# Patient Record
Sex: Male | Born: 1970 | Race: Black or African American | Hispanic: No | Marital: Single | Smoking: Former smoker
Health system: Southern US, Community
[De-identification: ages and names within clinical notes are randomized; demographics above are authoritative.]

## PROBLEM LIST (undated history)

## (undated) DIAGNOSIS — I10 Essential (primary) hypertension: Secondary | ICD-10-CM

## (undated) DIAGNOSIS — I5022 Chronic systolic (congestive) heart failure: Secondary | ICD-10-CM

## (undated) DIAGNOSIS — I428 Other cardiomyopathies: Secondary | ICD-10-CM

## (undated) DIAGNOSIS — I509 Heart failure, unspecified: Secondary | ICD-10-CM

## (undated) HISTORY — PX: CARDIAC CATHETERIZATION: SHX172

## (undated) HISTORY — DX: Essential (primary) hypertension: I10

## (undated) HISTORY — DX: Chronic systolic (congestive) heart failure: I50.22

---
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
## 2003-12-30 ENCOUNTER — Ambulatory Visit: Payer: Self-pay | Admitting: Internal Medicine

## 2004-10-28 ENCOUNTER — Ambulatory Visit: Payer: Self-pay | Admitting: Internal Medicine

## 2004-11-18 ENCOUNTER — Ambulatory Visit: Payer: Self-pay | Admitting: Internal Medicine

## 2005-12-19 ENCOUNTER — Ambulatory Visit: Payer: Self-pay | Admitting: Internal Medicine

## 2006-11-16 ENCOUNTER — Ambulatory Visit: Payer: Self-pay | Admitting: Internal Medicine

## 2014-04-12 ENCOUNTER — Emergency Department: Payer: Self-pay | Admitting: Emergency Medicine

## 2016-04-11 ENCOUNTER — Ambulatory Visit: Payer: Self-pay | Admitting: Primary Care

## 2016-09-06 ENCOUNTER — Ambulatory Visit (INDEPENDENT_AMBULATORY_CARE_PROVIDER_SITE_OTHER): Payer: Self-pay | Admitting: Internal Medicine

## 2016-09-06 ENCOUNTER — Encounter: Payer: Self-pay | Admitting: Internal Medicine

## 2016-09-06 VITALS — BP 152/102 | HR 102 | Temp 98.3°F | Ht 69.0 in | Wt 215.8 lb

## 2016-09-06 DIAGNOSIS — B079 Viral wart, unspecified: Secondary | ICD-10-CM | POA: Insufficient documentation

## 2016-09-06 DIAGNOSIS — I10 Essential (primary) hypertension: Secondary | ICD-10-CM | POA: Insufficient documentation

## 2016-09-06 DIAGNOSIS — A630 Anogenital (venereal) warts: Secondary | ICD-10-CM

## 2016-09-06 LAB — COMPREHENSIVE METABOLIC PANEL
ALK PHOS: 90 U/L (ref 39–117)
ALT: 67 U/L — AB (ref 0–53)
AST: 71 U/L — ABNORMAL HIGH (ref 0–37)
Albumin: 4.1 g/dL (ref 3.5–5.2)
BUN: 10 mg/dL (ref 6–23)
CO2: 30 meq/L (ref 19–32)
Calcium: 10.1 mg/dL (ref 8.4–10.5)
Chloride: 99 mEq/L (ref 96–112)
Creatinine, Ser: 1.01 mg/dL (ref 0.40–1.50)
GFR: 102.86 mL/min (ref >60.00)
GLUCOSE: 415 mg/dL — AB (ref 70–99)
POTASSIUM: 4.5 meq/L (ref 3.5–5.1)
SODIUM: 136 meq/L (ref 135–145)
TOTAL PROTEIN: 8.3 g/dL (ref 6.0–8.3)
Total Bilirubin: 0.4 mg/dL (ref 0.2–1.2)

## 2016-09-06 LAB — CBC WITH DIFFERENTIAL/PLATELET
Basophils Absolute: 0.1 10*3/uL (ref 0.0–0.1)
Basophils Relative: 1 % (ref 0.0–3.0)
EOS PCT: 1.3 % (ref 0.0–5.0)
Eosinophils Absolute: 0.1 10*3/uL (ref 0.0–0.7)
HCT: 46.3 % (ref 39.0–52.0)
Hemoglobin: 15.6 g/dL (ref 13.0–17.0)
LYMPHS ABS: 2.6 10*3/uL (ref 0.7–4.0)
Lymphocytes Relative: 32.4 % (ref 12.0–46.0)
MCHC: 33.6 g/dL (ref 30.0–36.0)
MCV: 90.6 fl (ref 78.0–100.0)
MONO ABS: 0.5 10*3/uL (ref 0.1–1.0)
Monocytes Relative: 6.8 % (ref 3.0–12.0)
NEUTROS ABS: 4.7 10*3/uL (ref 1.4–7.7)
NEUTROS PCT: 58.5 % (ref 43.0–77.0)
PLATELETS: 200 10*3/uL (ref 150.0–400.0)
RBC: 5.11 Mil/uL (ref 4.22–5.81)
RDW: 13.2 % (ref 11.5–15.5)
WBC: 8 10*3/uL (ref 4.0–10.5)

## 2016-09-06 LAB — LIPID PANEL
Cholesterol: 223 mg/dL — ABNORMAL HIGH (ref 0–200)
HDL: 28.5 mg/dL — ABNORMAL LOW (ref >39.00)
NONHDL: 194.4
Total CHOL/HDL Ratio: 8
Triglycerides: 388 mg/dL — ABNORMAL HIGH (ref 0.0–149.0)
VLDL: 77.6 mg/dL — ABNORMAL HIGH (ref 0.0–40.0)

## 2016-09-06 LAB — T4, FREE: FREE T4: 0.87 ng/dL (ref 0.60–1.60)

## 2016-09-06 LAB — LDL CHOLESTEROL, DIRECT: LDL DIRECT: 162 mg/dL

## 2016-09-06 MED ORDER — AMLODIPINE BESYLATE 5 MG PO TABS: 5.0000 mg | ORAL_TABLET | Freq: Every day | ORAL | 3 refills | Status: DC

## 2016-09-06 NOTE — Patient Instructions (Signed)
DASH Eating Plan DASH stands for "Dietary Approaches to Stop Hypertension." The DASH eating plan is a healthy eating plan that has been shown to reduce high blood pressure (hypertension). It may also reduce your risk for type 2 diabetes, heart disease, and stroke. The DASH eating plan may also help with weight loss. What are tips for following this plan? General guidelines  Avoid eating more than 2,300 mg (milligrams) of salt (sodium) a day. If you have hypertension, you may need to reduce your sodium intake to 1,500 mg a day.  Limit alcohol intake to no more than 1 drink a day for nonpregnant women and 2 drinks a day for men. One drink equals 12 oz of beer, 5 oz of wine, or 1 oz of hard liquor.  Work with your health care provider to maintain a healthy body weight or to lose weight. Ask what an ideal weight is for you.  Get at least 30 minutes of exercise that causes your heart to beat faster (aerobic exercise) most days of the week. Activities may include walking, swimming, or biking.  Work with your health care provider or diet and nutrition specialist (dietitian) to adjust your eating plan to your individual calorie needs. Reading food labels  Check food labels for the amount of sodium per serving. Choose foods with less than 5 percent of the Daily Value of sodium. Generally, foods with less than 300 mg of sodium per serving fit into this eating plan.  To find whole grains, look for the word "whole" as the first word in the ingredient list. Shopping  Buy products labeled as "low-sodium" or "no salt added."  Buy fresh foods. Avoid canned foods and premade or frozen meals. Cooking  Avoid adding salt when cooking. Use salt-free seasonings or herbs instead of table salt or sea salt. Check with your health care provider or pharmacist before using salt substitutes.  Do not fry foods. Cook foods using healthy methods such as baking, boiling, grilling, and broiling instead.  Cook with  heart-healthy oils, such as olive, canola, soybean, or sunflower oil. Meal planning   Eat a balanced diet that includes: ? 5 or more servings of fruits and vegetables each day. At each meal, try to fill half of your plate with fruits and vegetables. ? Up to 6-8 servings of whole grains each day. ? Less than 6 oz of lean meat, poultry, or fish each day. A 3-oz serving of meat is about the same size as a deck of cards. One egg equals 1 oz. ? 2 servings of low-fat dairy each day. ? A serving of nuts, seeds, or beans 5 times each week. ? Heart-healthy fats. Healthy fats called Omega-3 fatty acids are found in foods such as flaxseeds and coldwater fish, like sardines, salmon, and mackerel.  Limit how much you eat of the following: ? Canned or prepackaged foods. ? Food that is high in trans fat, such as fried foods. ? Food that is high in saturated fat, such as fatty meat. ? Sweets, desserts, sugary drinks, and other foods with added sugar. ? Full-fat dairy products.  Do not salt foods before eating.  Try to eat at least 2 vegetarian meals each week.  Eat more home-cooked food and less restaurant, buffet, and fast food.  When eating at a restaurant, ask that your food be prepared with less salt or no salt, if possible. What foods are recommended? The items listed may not be a complete list. Talk with your dietitian about what   dietary choices are best for you. Grains Whole-grain or whole-wheat bread. Whole-grain or whole-wheat pasta. Brown rice. Oatmeal. Quinoa. Bulgur. Whole-grain and low-sodium cereals. Pita bread. Low-fat, low-sodium crackers. Whole-wheat flour tortillas. Vegetables Fresh or frozen vegetables (raw, steamed, roasted, or grilled). Low-sodium or reduced-sodium tomato and vegetable juice. Low-sodium or reduced-sodium tomato sauce and tomato paste. Low-sodium or reduced-sodium canned vegetables. Fruits All fresh, dried, or frozen fruit. Canned fruit in natural juice (without  added sugar). Meat and other protein foods Skinless chicken or turkey. Ground chicken or turkey. Pork with fat trimmed off. Fish and seafood. Egg whites. Dried beans, peas, or lentils. Unsalted nuts, nut butters, and seeds. Unsalted canned beans. Lean cuts of beef with fat trimmed off. Low-sodium, lean deli meat. Dairy Low-fat (1%) or fat-free (skim) milk. Fat-free, low-fat, or reduced-fat cheeses. Nonfat, low-sodium ricotta or cottage cheese. Low-fat or nonfat yogurt. Low-fat, low-sodium cheese. Fats and oils Soft margarine without trans fats. Vegetable oil. Low-fat, reduced-fat, or light mayonnaise and salad dressings (reduced-sodium). Canola, safflower, olive, soybean, and sunflower oils. Avocado. Seasoning and other foods Herbs. Spices. Seasoning mixes without salt. Unsalted popcorn and pretzels. Fat-free sweets. What foods are not recommended? The items listed may not be a complete list. Talk with your dietitian about what dietary choices are best for you. Grains Baked goods made with fat, such as croissants, muffins, or some breads. Dry pasta or rice meal packs. Vegetables Creamed or fried vegetables. Vegetables in a cheese sauce. Regular canned vegetables (not low-sodium or reduced-sodium). Regular canned tomato sauce and paste (not low-sodium or reduced-sodium). Regular tomato and vegetable juice (not low-sodium or reduced-sodium). Pickles. Olives. Fruits Canned fruit in a light or heavy syrup. Fried fruit. Fruit in cream or butter sauce. Meat and other protein foods Fatty cuts of meat. Ribs. Fried meat. Bacon. Sausage. Bologna and other processed lunch meats. Salami. Fatback. Hotdogs. Bratwurst. Salted nuts and seeds. Canned beans with added salt. Canned or smoked fish. Whole eggs or egg yolks. Chicken or turkey with skin. Dairy Whole or 2% milk, cream, and half-and-half. Whole or full-fat cream cheese. Whole-fat or sweetened yogurt. Full-fat cheese. Nondairy creamers. Whipped toppings.  Processed cheese and cheese spreads. Fats and oils Butter. Stick margarine. Lard. Shortening. Ghee. Bacon fat. Tropical oils, such as coconut, palm kernel, or palm oil. Seasoning and other foods Salted popcorn and pretzels. Onion salt, garlic salt, seasoned salt, table salt, and sea salt. Worcestershire sauce. Tartar sauce. Barbecue sauce. Teriyaki sauce. Soy sauce, including reduced-sodium. Steak sauce. Canned and packaged gravies. Fish sauce. Oyster sauce. Cocktail sauce. Horseradish that you find on the shelf. Ketchup. Mustard. Meat flavorings and tenderizers. Bouillon cubes. Hot sauce and Tabasco sauce. Premade or packaged marinades. Premade or packaged taco seasonings. Relishes. Regular salad dressings. Where to find more information:  National Heart, Lung, and Blood Institute: www.nhlbi.nih.gov  American Heart Association: www.heart.org Summary  The DASH eating plan is a healthy eating plan that has been shown to reduce high blood pressure (hypertension). It may also reduce your risk for type 2 diabetes, heart disease, and stroke.  With the DASH eating plan, you should limit salt (sodium) intake to 2,300 mg a day. If you have hypertension, you may need to reduce your sodium intake to 1,500 mg a day.  When on the DASH eating plan, aim to eat more fresh fruits and vegetables, whole grains, lean proteins, low-fat dairy, and heart-healthy fats.  Work with your health care provider or diet and nutrition specialist (dietitian) to adjust your eating plan to your individual   calorie needs. This information is not intended to replace advice given to you by your health care provider. Make sure you discuss any questions you have with your health care provider. Document Released: 12/23/2010 Document Revised: 12/28/2015 Document Reviewed: 12/28/2015 Elsevier Interactive Patient Education  2017 Elsevier Inc.  

## 2016-09-06 NOTE — Assessment & Plan Note (Signed)
BP Readings from Last 3 Encounters:  09/06/16 (!) 152/102  11/16/06 132/82   Recheck on right was 176/92 Will start with amlodipine Consider adding diuretic if still up (or ARB combo)

## 2016-09-06 NOTE — Assessment & Plan Note (Signed)
Liquid nitrogen 30 seconds x 2 Discussed condoms

## 2016-09-06 NOTE — Progress Notes (Signed)
Subjective:    Patient ID: Joshua Cordova, male    DOB: 31-Oct-1970, 46 y.o.   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   No current facility-administered medications on file prior to visit.     No Known Allergies  Past Medical History:  Diagnosis Date  . Hypertension     No past surgical history on file.  No family history on file.  Social History   Social History  . Marital status: Single    Spouse name: N/A  . Number of children: N/A  . Years of education: N/A   Occupational History  . [employer]   . Caregiver    Social History Main Topics  . Smoking status: Former Smoker    Packs/day: 1.00    Types: Cigarettes  . Smokeless tobacco: Never Used  . Alcohol use No  . Drug use: Unknown  . Sexual activity: Not on file   Other Topics Concern  . Not on file   Social History Narrative  . No narrative on file   Review of Systems  Constitutional: Positive for fatigue and unexpected weight change.       Tired a lot  HENT: Positive for dental problem. Negative for hearing loss.   Respiratory: Negative for cough, chest tightness and shortness of breath.   Cardiovascular: Negative for chest pain, palpitations and leg swelling.  Gastrointestinal: Negative for abdominal pain, blood in stool and constipation.       No recent heartburn  Genitourinary: Negative for difficulty urinating and urgency.       Discussed safe sex--plans to abstain  Musculoskeletal: Positive for back  pain. Negative for arthralgias and joint swelling.       Uses tylenol prn  Skin:       Has genital wart---did have this treated before  Neurological: Positive for headaches. Negative for dizziness, syncope and light-headedness.  Psychiatric/Behavioral: Positive for sleep disturbance. Negative for dysphoric mood. The patient is not nervous/anxious.        Objective:   Physical Exam  Constitutional: He is oriented to person, place, and time. He appears well-nourished. No distress.  HENT:  Mouth/Throat: Oropharynx is clear and moist. No oropharyngeal exudate.  Neck: No thyromegaly present.  Cardiovascular: Normal rate, regular rhythm, normal heart sounds and intact distal pulses.  Exam reveals no gallop.   No murmur heard. Pulmonary/Chest: Effort normal and breath sounds normal. No respiratory distress. He has no wheezes. He has no rales.  Abdominal: Soft. He exhibits no distension. There is no tenderness. There is no rebound and no guarding.  Musculoskeletal: He exhibits no edema or tenderness.  Lymphadenopathy:    He has no cervical adenopathy.  Neurological: He is alert and oriented to person, place, and time.  Skin: No erythema.  Polypoid ~42mm wart on shaft of penis  Psychiatric: He has a normal mood and affect. His behavior is normal.          Assessment & Plan:

## 2016-09-07 ENCOUNTER — Other Ambulatory Visit: Payer: Self-pay | Admitting: Internal Medicine

## 2016-09-07 MED ORDER — METFORMIN HCL 500 MG PO TABS: 500.0000 mg | ORAL_TABLET | Freq: Two times a day (BID) | ORAL | 3 refills | Status: DC

## 2016-09-12 ENCOUNTER — Encounter: Payer: Self-pay | Admitting: Internal Medicine

## 2016-09-12 ENCOUNTER — Ambulatory Visit (INDEPENDENT_AMBULATORY_CARE_PROVIDER_SITE_OTHER): Payer: Self-pay | Admitting: Internal Medicine

## 2016-09-12 DIAGNOSIS — IMO0001 Reserved for inherently not codable concepts without codable children: Secondary | ICD-10-CM | POA: Insufficient documentation

## 2016-09-12 DIAGNOSIS — E119 Type 2 diabetes mellitus without complications: Secondary | ICD-10-CM

## 2016-09-12 DIAGNOSIS — E1165 Type 2 diabetes mellitus with hyperglycemia: Secondary | ICD-10-CM

## 2016-09-12 LAB — HEMOGLOBIN A1C: Hgb A1c MFr Bld: 12.8 % — ABNORMAL HIGH (ref 4.6–6.5)

## 2016-09-12 LAB — GLUCOSE, RANDOM: Glucose, Bld: 286 mg/dL — ABNORMAL HIGH (ref 70–99)

## 2016-09-12 NOTE — Progress Notes (Signed)
   Subjective:    Patient ID: Joshua Cordova, male    DOB: 1970-03-25, 46 y.o.   MRN: [id number]  HPI  Here for new diagnosis of diabetes He did worry about it due to polydipsia and polyuria  Has been careful with avoiding sweets recently--but lots of candy, sweet buns, etc Has started the metformin--some bowel issues and stomach upset Discussed using it with breakfast and supper  He has not had education at all about diabetes Prefers not [facility]  Current Outpatient Prescriptions on File Prior to Visit  Medication Sig Dispense Refill  . amLODipine (NORVASC) 5 MG tablet Take 1 tablet (5 mg total) by mouth daily. 90 tablet 3  . metFORMIN (GLUCOPHAGE) 500 MG tablet Take 1 tablet (500 mg total) by mouth 2 (two) times daily with a meal. 180 tablet 3   No current facility-administered medications on file prior to visit.     No Known Allergies  Past Medical History:  Diagnosis Date  . Hypertension     No past surgical history on file.  Family History  Problem Relation Age of Onset  . Diabetes Mother   . Hypertension Father   . Diabetes Maternal Grandfather   . Heart disease Other     Social History   Social History  . Marital status: Single    Spouse name: N/A  . Number of children: N/A  . Years of education: N/A   Occupational History  . [employer]   . Caregiver    Social History Main Topics  . Smoking status: Former Smoker    Packs/day: 1.00    Types: Cigarettes  . Smokeless tobacco: Never Used  . Alcohol use No  . Drug use: Unknown  . Sexual activity: Not on file   Other Topics Concern  . Not on file   Social History Narrative  . No narrative on file   Review of Systems  No edema  No problems with BP medication      Objective:   Physical Exam  Constitutional: He appears well-nourished. No distress.  Psychiatric: He has a normal mood and affect. His behavior is normal.          Assessment & Plan:

## 2016-09-12 NOTE — Assessment & Plan Note (Signed)
Counseled on proper eating and regular exercise Will continue the metformin--consider change to long acting if ongoing GI problems Will go ahead with referral for diabetic counseling (he prefers [facility] to [facility])

## 2016-09-12 NOTE — Patient Instructions (Signed)
Let me know if you keep having problems with the metformin---I would change you to the long acting form which may be better tolerated.

## 2016-10-13 ENCOUNTER — Ambulatory Visit: Payer: Self-pay | Admitting: Internal Medicine

## 2016-12-12 ENCOUNTER — Telehealth: Payer: Self-pay

## 2016-12-12 NOTE — Telephone Encounter (Signed)
Spoke to pt. He is willing to do diet and exercise. He has a meter. He has an OV here 12-15-16.

## 2016-12-12 NOTE — Telephone Encounter (Signed)
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 Amy request resources for pt's with no ins. Sending to Dr Alphonsus Sias ; would Revonda Standard be able to offer community resources possibly.Please advise.Amy at [facility] request cb to see what might can be done to assist.

## 2016-12-12 NOTE — Telephone Encounter (Signed)
Please check and see if he is comfortable with healthy eating, etc Make sure he has machine to test his sugars--at least once a week or so And he should have follow up here

## 2016-12-15 ENCOUNTER — Encounter: Payer: Self-pay | Admitting: Internal Medicine

## 2016-12-15 ENCOUNTER — Ambulatory Visit (INDEPENDENT_AMBULATORY_CARE_PROVIDER_SITE_OTHER): Payer: Self-pay | Admitting: Internal Medicine

## 2016-12-15 VITALS — BP 116/78 | HR 100 | Temp 98.5°F | Wt 221.0 lb

## 2016-12-15 DIAGNOSIS — E1165 Type 2 diabetes mellitus with hyperglycemia: Secondary | ICD-10-CM

## 2016-12-15 DIAGNOSIS — IMO0001 Reserved for inherently not codable concepts without codable children: Secondary | ICD-10-CM

## 2016-12-15 LAB — HEMOGLOBIN A1C: Hgb A1c MFr Bld: 8.8 % — ABNORMAL HIGH (ref 4.6–6.5)

## 2016-12-15 NOTE — Assessment & Plan Note (Signed)
Clearly much better control Couldn't tolerate the metformin Goal at his age would be under 7.5%----if still over 9% now, would try glipizide

## 2016-12-15 NOTE — Progress Notes (Signed)
   Subjective:    Patient ID: Joshua Cordova, male    DOB: 07/15/1970, 46 y.o.   MRN: [id number]  HPI Here for follow up of diabetes  Didn't tolerate the metformin--bothering his stomach Did go to some of the diabetes education but wasn't able to finish due to expense Using OTC supplement which he thinks is helping---chromium, cinnamon, gsyenium (sp)  Checking sugars tid  Mostly under 140---highest in AM Just checked--129 two hours after eating  Has made adjustments with his eating Gave up all sweets Trying to walk regularly  Current Outpatient Medications on File Prior to Visit  Medication Sig Dispense Refill  . amLODipine (NORVASC) 5 MG tablet Take 1 tablet (5 mg total) by mouth daily. 90 tablet 3   No current facility-administered medications on file prior to visit.     No Known Allergies  Past Medical History:  Diagnosis Date  . Hypertension     History reviewed. No pertinent surgical history.  Family History  Problem Relation Age of Onset  . Diabetes Mother   . Hypertension Father   . Diabetes Maternal Grandfather   . Heart disease Other     Social History   Socioeconomic History  . Marital status: Single    Spouse name: Not on file  . Number of children: Not on file  . Years of education: Not on file  . Highest education level: Not on file  Social Needs  . Financial resource strain: Not on file  . Food insecurity - worry: Not on file  . Food insecurity - inability: Not on file  . Transportation needs - medical: Not on file  . Transportation needs - non-medical: Not on file  Occupational History  . Occupation: [employer]  . Occupation: Caregiver  Tobacco Use  . Smoking status: Former Smoker    Packs/day: 1.00    Types: Cigarettes  . Smokeless tobacco: Never Used  Substance and Sexual Activity  . Alcohol use: No  . Drug use: Not on file  . Sexual activity: Not on file  Other Topics Concern  . Not on file  Social History Narrative  . Not on file    Review of Systems Weight is up some--he relates to the heavy clothes today No problems with BP medication Mood is good Looking for work now    Objective:   Physical Exam  Constitutional: No distress.  Neck: No thyromegaly present.  Cardiovascular: Normal rate, regular rhythm and normal heart sounds. Exam reveals no gallop.  No murmur heard. Pulmonary/Chest: Effort normal and breath sounds normal. No respiratory distress. He has no wheezes. He has no rales.  Musculoskeletal: He exhibits no edema.  Lymphadenopathy:    He has no cervical adenopathy.  Psychiatric: He has a normal mood and affect. His behavior is normal.          Assessment & Plan:

## 2017-05-15 ENCOUNTER — Telehealth: Payer: Self-pay

## 2017-05-15 NOTE — Telephone Encounter (Signed)
Amy Brian pharmacist from [facility] diabetic teaching program needs pt most recent A!C. Info given.  

## 2017-06-15 ENCOUNTER — Ambulatory Visit: Payer: Self-pay | Admitting: Internal Medicine

## 2017-09-15 ENCOUNTER — Ambulatory Visit (INDEPENDENT_AMBULATORY_CARE_PROVIDER_SITE_OTHER): Payer: Self-pay | Admitting: Internal Medicine

## 2017-09-15 ENCOUNTER — Encounter: Payer: Self-pay | Admitting: Internal Medicine

## 2017-09-15 VITALS — BP 116/84 | HR 94 | Temp 97.8°F | Ht 71.0 in | Wt 219.0 lb

## 2017-09-15 DIAGNOSIS — E1165 Type 2 diabetes mellitus with hyperglycemia: Secondary | ICD-10-CM

## 2017-09-15 DIAGNOSIS — IMO0001 Reserved for inherently not codable concepts without codable children: Secondary | ICD-10-CM

## 2017-09-15 DIAGNOSIS — R21 Rash and other nonspecific skin eruption: Secondary | ICD-10-CM

## 2017-09-15 DIAGNOSIS — J4520 Mild intermittent asthma, uncomplicated: Secondary | ICD-10-CM

## 2017-09-15 DIAGNOSIS — J45909 Unspecified asthma, uncomplicated: Secondary | ICD-10-CM | POA: Insufficient documentation

## 2017-09-15 DIAGNOSIS — I10 Essential (primary) hypertension: Secondary | ICD-10-CM

## 2017-09-15 LAB — COMPREHENSIVE METABOLIC PANEL
ALT: 17 U/L (ref 0–53)
AST: 19 U/L (ref 0–37)
Albumin: 4 g/dL (ref 3.5–5.2)
Alkaline Phosphatase: 68 U/L (ref 39–117)
BUN: 14 mg/dL (ref 6–23)
CHLORIDE: 104 meq/L (ref 96–112)
CO2: 27 meq/L (ref 19–32)
CREATININE: 1.18 mg/dL (ref 0.40–1.50)
Calcium: 9.2 mg/dL (ref 8.4–10.5)
GFR: 85.56 mL/min (ref >60.00)
Glucose, Bld: 210 mg/dL — ABNORMAL HIGH (ref 70–99)
Potassium: 4.4 mEq/L (ref 3.5–5.1)
SODIUM: 139 meq/L (ref 135–145)
Total Bilirubin: 0.5 mg/dL (ref 0.2–1.2)
Total Protein: 7.2 g/dL (ref 6.0–8.3)

## 2017-09-15 LAB — LIPID PANEL
CHOL/HDL RATIO: 7
Cholesterol: 165 mg/dL (ref 0–200)
HDL: 25 mg/dL — ABNORMAL LOW (ref >39.00)
LDL CALC: 104 mg/dL — AB (ref 0–99)
NonHDL: 139.55
Triglycerides: 179 mg/dL — ABNORMAL HIGH (ref 0.0–149.0)
VLDL: 35.8 mg/dL (ref 0.0–40.0)

## 2017-09-15 LAB — HEMOGLOBIN A1C: HEMOGLOBIN A1C: 6.9 % — AB (ref 4.6–6.5)

## 2017-09-15 LAB — MICROALBUMIN / CREATININE URINE RATIO
Creatinine,U: 176.3 mg/dL
MICROALB/CREAT RATIO: 25.6 mg/g (ref 0.0–30.0)
Microalb, Ur: 45 mg/dL — ABNORMAL HIGH (ref 0.0–1.9)

## 2017-09-15 LAB — CBC
HEMATOCRIT: 41.1 % (ref 39.0–52.0)
Hemoglobin: 13.8 g/dL (ref 13.0–17.0)
MCHC: 33.6 g/dL (ref 30.0–36.0)
MCV: 89.4 fl (ref 78.0–100.0)
Platelets: 158 10*3/uL (ref 150.0–400.0)
RBC: 4.59 Mil/uL (ref 4.22–5.81)
RDW: 14.3 % (ref 11.5–15.5)
WBC: 7.1 10*3/uL (ref 4.0–10.5)

## 2017-09-15 LAB — HM DIABETES FOOT EXAM

## 2017-09-15 MED ORDER — KETOCONAZOLE 2 % EX CREA: 1.0000 "application " | TOPICAL_CREAM | Freq: Two times a day (BID) | CUTANEOUS | 1 refills | Status: DC

## 2017-09-15 MED ORDER — ALBUTEROL SULFATE HFA 108 (90 BASE) MCG/ACT IN AERS: 2.0000 | INHALATION_SPRAY | Freq: Four times a day (QID) | RESPIRATORY_TRACT | 1 refills | Status: DC | PRN

## 2017-09-15 MED ORDER — GLUCOSE BLOOD VI STRP: ORAL_STRIP | 12 refills | Status: DC

## 2017-09-15 NOTE — Assessment & Plan Note (Signed)
Childhood problems and recent exacerbation (from apparent viral infection) Will Rx albuterol MDI for prn use

## 2017-09-15 NOTE — Patient Instructions (Signed)
Let me know if the cream doesn't clear up the rash.

## 2017-09-15 NOTE — Assessment & Plan Note (Signed)
Looks fungal Will try ketoconazole cream Consider TAC if persists

## 2017-09-15 NOTE — Progress Notes (Signed)
Subjective:    Patient ID: Joshua Cordova, male    DOB: [birth year], 47 y.o.   MRN: [id number]  HPI Here due to breathing problems  Feels he had a "bronchial flare up" Has heard some rattling Coughing up purulent sputum Not sleeping well Some help from primatene No fever Breathing is better now Quit smoking 2 years ago  Still checks sugars sporadically Fasting under 120 usually  Current Outpatient Medications on File Prior to Visit  Medication Sig Dispense Refill  . amLODipine (NORVASC) 5 MG tablet Take 1 tablet (5 mg total) by mouth daily. 90 tablet 3   No current facility-administered medications on file prior to visit.     No Known Allergies  Past Medical History:  Diagnosis Date  . Hypertension     History reviewed. No pertinent surgical history.  Family History  Problem Relation Age of Onset  . Diabetes Mother   . Hypertension Father   . Diabetes Maternal Grandfather   . Heart disease Other     Social History   Socioeconomic History  . Marital status: Single    Spouse name: Not on file  . Number of children: Not on file  . Years of education: Not on file  . Highest education level: Not on file  Occupational History  . Occupation:  Shipping    Comment: [employer]  . Occupation:    Social Needs  . Financial resource strain: Not on file  . Food insecurity:    Worry: Not on file    Inability: Not on file  . Transportation needs:    Medical: Not on file    Non-medical: Not on file  Tobacco Use  . Smoking status: Former Smoker    Packs/day: 1.00    Types: Cigarettes  . Smokeless tobacco: Never Used  Substance and Sexual Activity  . Alcohol use: No  . Drug use: Not on file  . Sexual activity: Not on file  Lifestyle  . Physical activity:    Days per week: Not on file    Minutes per session: Not on file  . Stress: Not on file  Relationships  . Social connections:    Talks on phone: Not on file    Gets together: Not on file    Attends  religious service: Not on file    Active member of club or organization: Not on file    Attends meetings of clubs or organizations: Not on file    Relationship status: Not on file  . Intimate partner violence:    Fear of current or ex partner: Not on file    Emotionally abused: Not on file    Physically abused: Not on file    Forced sexual activity: Not on file  Other Topics Concern  . Not on file  Social History Narrative  . Not on file   Review of Systems Weight is stable Has spots on legs for some months--used some antifungal shampoo and it helped some (ketoconazole) OTC cortisone not helping    Objective:   Physical Exam  Constitutional: He appears well-developed. No distress.  Neck: No thyromegaly present.  Cardiovascular: Normal rate, regular rhythm, normal heart sounds and intact distal pulses. Exam reveals no gallop.  No murmur heard. Respiratory: Effort normal and breath sounds normal. No respiratory distress. He has no wheezes. He has no rales.  Musculoskeletal: He exhibits no edema or tenderness.  Lymphadenopathy:    He has no cervical adenopathy.  Skin:  Scaly rash on lower  calves No foot lesions           Assessment & Plan:

## 2017-09-15 NOTE — Assessment & Plan Note (Signed)
He has been working on his lifestyle considerably ---walking daily, etc If A1c still over 7.5%---will add low dose glipizide Discussed statin--will consider low dose atorvastatin

## 2017-09-15 NOTE — Addendum Note (Signed)
Addended by: Alvina Chou on: 09/15/2017 11:04 AM   Modules accepted: Orders

## 2017-09-15 NOTE — Assessment & Plan Note (Signed)
BP Readings from Last 3 Encounters:  09/15/17 116/84  12/15/16 116/78  09/12/16 140/88   Good control Consider change to ACEI/ARB if urine microal positive

## 2018-01-26 ENCOUNTER — Ambulatory Visit: Payer: Self-pay | Admitting: Family Medicine

## 2018-01-26 ENCOUNTER — Encounter: Payer: Self-pay | Admitting: Family Medicine

## 2018-01-26 ENCOUNTER — Telehealth: Payer: Self-pay

## 2018-01-26 ENCOUNTER — Ambulatory Visit (INDEPENDENT_AMBULATORY_CARE_PROVIDER_SITE_OTHER)
Admission: RE | Admit: 2018-01-26 | Discharge: 2018-01-26 | Disposition: A | Discharge status: Home / Self Care | Payer: Self-pay | Source: Ambulatory Visit | Attending: Family Medicine | Admitting: Family Medicine

## 2018-01-26 VITALS — BP 120/70 | HR 102 | Temp 98.4°F | Ht 71.0 in | Wt 232.5 lb

## 2018-01-26 DIAGNOSIS — R0602 Shortness of breath: Secondary | ICD-10-CM

## 2018-01-26 LAB — CBC WITH DIFFERENTIAL/PLATELET
BASOS ABS: 0 10*3/uL (ref 0.0–0.1)
Basophils Relative: 0.6 % (ref 0.0–3.0)
EOS ABS: 0.1 10*3/uL (ref 0.0–0.7)
Eosinophils Relative: 1.3 % (ref 0.0–5.0)
HCT: 41.8 % (ref 39.0–52.0)
HEMOGLOBIN: 14.2 g/dL (ref 13.0–17.0)
LYMPHS PCT: 32.4 % (ref 12.0–46.0)
Lymphs Abs: 2.6 10*3/uL (ref 0.7–4.0)
MCHC: 33.9 g/dL (ref 30.0–36.0)
MCV: 89.9 fl (ref 78.0–100.0)
Monocytes Absolute: 0.6 10*3/uL (ref 0.1–1.0)
Monocytes Relative: 7.7 % (ref 3.0–12.0)
Neutro Abs: 4.6 10*3/uL (ref 1.4–7.7)
Neutrophils Relative %: 58 % (ref 43.0–77.0)
Platelets: 180 10*3/uL (ref 150.0–400.0)
RBC: 4.65 Mil/uL (ref 4.22–5.81)
RDW: 14.3 % (ref 11.5–15.5)
WBC: 8 10*3/uL (ref 4.0–10.5)

## 2018-01-26 MED ORDER — MONTELUKAST SODIUM 10 MG PO TABS: 10.0000 mg | ORAL_TABLET | Freq: Every day | ORAL | 3 refills | Status: DC

## 2018-01-26 MED ORDER — PREDNISONE 20 MG PO TABS: ORAL_TABLET | ORAL | 0 refills | Status: DC

## 2018-01-26 NOTE — Progress Notes (Signed)
Subjective:    Patient ID: Joshua Cordova, male    DOB: 1970/09/25, 48 y.o.   MRN: [id number]  Shortness of Breath  This is a new problem. The current episode started more than 1 month ago (off and on in last 8 months, worse in last week). The problem has been gradually worsening. Associated symptoms include wheezing. Pertinent negatives include no chest pain, ear pain, fever, leg pain, sore throat or swollen glands. The symptoms are aggravated by occupational exposure, smoke and lying flat. Associated symptoms comments:  Congestion  mild cough.. minimal production.  no leg swelling. . Feels sensation of rattle in left anterior chest when breathing.... much better now after taking 4 puff albuterol before coming in. The patient has no known risk factors for DVT/PE. He has tried beta agonist inhalers for the symptoms. The treatment provided significant relief. His past medical history is significant for asthma. There is no history of CAD, chronic lung disease, a heart failure, PE or a recent surgery.    Took 4 puff of albuterol this AM.. helped open his lungs.  Hx of  Childhood asthma.. with recent flare ups.   He has had some intermittent flare up of asthma in last 8 month... feel is it is lingering more now.  Goal peak flow > 466 No chest pain.  Today's best peak flow was 490.  Reviewed 08/2017 note when saw PCP as flare ups started.   Former smoker.  No new allergens but works at [employer].. breathing worsened when started.  Extremely tired all the time.  Social History /Family History/Past Medical History reviewed in detail and updated in EMR if needed. Blood pressure 120/70, pulse (!) 102, temperature 98.4 F (36.9 C), temperature source Oral, height 5\' 11"  (1.803 m), weight 232 lb 8 oz (105.5 kg), SpO2 95 %.   Review of Systems  Constitutional: Negative for fever.  HENT: Negative for ear pain and sore throat.   Respiratory: Positive for shortness of breath and wheezing.     Cardiovascular: Negative for chest pain.       Objective:   Physical Exam Constitutional:      General: He is not in acute distress.    Appearance: Normal appearance. He is well-developed. He is not ill-appearing or toxic-appearing.  HENT:     Head: Normocephalic and atraumatic.     Right Ear: Hearing, tympanic membrane, ear canal and external ear normal. No tenderness. No foreign body. Tympanic membrane is not retracted or bulging.     Left Ear: Hearing, tympanic membrane, ear canal and external ear normal. No tenderness. No foreign body. Tympanic membrane is not retracted or bulging.     Nose: Mucosal edema present. No rhinorrhea.     Right Sinus: No maxillary sinus tenderness or frontal sinus tenderness.     Left Sinus: No maxillary sinus tenderness or frontal sinus tenderness.     Mouth/Throat:     Dentition: Normal dentition. No dental caries.     Pharynx: Uvula midline. No oropharyngeal exudate.     Tonsils: No tonsillar abscesses.  Eyes:     General: Lids are normal. Lids are everted, no foreign bodies appreciated.     Conjunctiva/sclera: Conjunctivae normal.     Pupils: Pupils are equal, round, and reactive to light.  Neck:     Musculoskeletal: Normal range of motion and neck supple.     Thyroid: No thyroid mass or thyromegaly.     Vascular: No carotid bruit.     Trachea: Trachea  and phonation normal.  Cardiovascular:     Rate and Rhythm: Normal rate and regular rhythm.     Pulses: Normal pulses.     Heart sounds: Normal heart sounds, S1 normal and S2 normal. No murmur. No gallop.   Pulmonary:     Effort: Pulmonary effort is normal. No respiratory distress.     Breath sounds: Normal breath sounds. No wheezing, rhonchi or rales.     Comments: No current wheeze, but pt just took 4 puffs albuterol prior to coming in Abdominal:     General: Bowel sounds are normal.     Palpations: Abdomen is soft.     Tenderness: There is no abdominal tenderness. There is no guarding or  rebound.     Hernia: No hernia is present.  Skin:    General: Skin is warm and dry.     Findings: No rash.  Neurological:     Mental Status: He is alert.     Deep Tendon Reflexes: Reflexes are normal and symmetric.  Psychiatric:        Speech: Speech normal.        Behavior: Behavior normal.        Judgment: Judgment normal.           Assessment & Plan:

## 2018-01-26 NOTE — Telephone Encounter (Signed)
Destiny said that pt called back and scheduled appt with Dr Ermalene Searing 01/26/18 at 12 noon. FYI to Dr Ermalene Searing.

## 2018-01-26 NOTE — Telephone Encounter (Signed)
[facility] Night - Client TELEPHONE ADVICE RECORD [facility] Call Center Patient Name: Joshua Cordova Gender: Male DOB: May 28, 1970 Age: 48 Y 1 M 13 D Return Phone Number: [phone] (Primary), [phone] (Secondary) Address: City/State/Zip: [place] [address] Client [facility] Night - Client Client Site [facility] [place] - Night Physician Tillman Abide - MD Contact Type Call Who Is Calling Patient / Member / Family / Caregiver Call Type Triage / Clinical Relationship To Patient Self Return Phone Number [phone] (Primary) Chief Complaint BREATHING - shortness of breath or sounds breathless Reason for Call Symptomatic / Request for Health Information Initial Comment Caller states He is having SOB, Translation No Nurse Assessment Nurse: Yetta Barre, RN, Miranda Date/Time (Eastern Time): 01/26/2018 7:41:43 AM Confirm and document reason for call. If symptomatic, describe symptoms. ---Caller states he has been having SOB since 11pm and can feel a rattling in his chest. He denies a cough. Albuterol last dose 1.5 hr ago. Does the patient have any new or worsening symptoms? ---Yes Will a triage be completed? ---Yes Related visit to physician within the last 2 weeks? ---No Does the PT have any chronic conditions? (i.e. diabetes, asthma, this includes High risk factors for pregnancy, etc.) ---Yes List chronic conditions. ---Asthma Is this a behavioral health or substance abuse call? ---No Guidelines Guideline Title Affirmed Question Affirmed Notes Nurse Date/Time (Eastern Time) Asthma Attack [1] Wheezing or coughing AND [2] hasn't used neb or inhaler twice AND [3] it's available Yetta Barre, RN, Miranda 01/26/2018 7:43:52 AM Asthma Attack MILD asthma attack (e.g., no SOB at rest, mild SOB with walking, speaks normally in sentences, mild wheezing) Yetta Barre, RN, Miranda 01/26/2018 8:12:50 AM Disp. Time Lamount Cohen  Time) Disposition Final User 01/26/2018 7:37:09 AM Send to Urgent Queue Josephina Shih 01/26/2018 7:46:17 AM Urgent Home Treatment with Follow-Up Call Yetta Barre, RN, Miranda PLEASE NOTE: All timestamps contained within this report are represented as [place] Standard Time. CONFIDENTIALTY NOTICE: This fax transmission is intended only for the addressee. It contains information that is legally privileged, confidential or otherwise protected from use or disclosure. If you are not the intended recipient, you are strictly prohibited from reviewing, disclosing, copying using or disseminating any of this information or taking any action in reliance on or regarding this information. If you have received this fax in error, please notify us immediately by telephone so that we can arrange for its return to [place]. Phone: [phone], Toll-Free: [phone], Fax: [phone] Page: 2 of 2 Call Id: [id number] Disp. Time Lamount Cohen Time) Disposition Final User 01/26/2018 7:46:39 AM Send To RN Personal Yetta Barre, RN, Miranda 01/26/2018 8:15:17 AM Home Care Yes Yetta Barre, RN, Miranda Caller Disagree/Comply Comply Caller Understands Yes PreDisposition Call Doctor Care Advice Given Per Guideline URGENT HOME TREATMENT WITH FOLLOW-UP CALL: * CALL CENTER PROVIDES RN CALL-BACKS: You should usually improve with the home treatment advice I give you. I'll call you back in 30-60 minutes to see how you are doing. Call me back immediately if: you become worse before my follow-up call. ASTHMA QUICK-RELIEF MEDICINE (e.g., albuterol, salbutamol, Xopenex): * Give yourself a nebulizer or inhaler (4 puffs) treatment using your quick-relief medicine (e.g., albuterol) right now. * Then I'll call you back. CARE ADVICE given per Asthma Attack (Adult) guideline. CALL BACK IF: * You become worse before RN follow-up call HOME CARE: * You should be able to treat this at home. REASSURANCE AND EDUCATION: It sounds like a mild asthma attack that we can treat  at home. ASTHMA QUICK-RELIEF  MEDICINE: * Start your quick-relief medicine (e.g., albuterol, salbutamol) at the first sign of any coughing or shortness of breath (don't wait for wheezing). * Use inhaler (2 puffs each time) or nebulizer every 4 hours. * Continue the quick-relief asthma medicine until you have not wheezed or coughed for 48 hours. It takes a minimum of 7 days of medicine for lung function to return to normal. DRINKING FLUIDS AND USING A HUMIDIFIER: * Drink a normal amount of liquids (e.g., water). Being adequately hydrated makes it easier to cough up the sticky lung mucus. CALL BACK IF: * Wheezing is not improved after neb or inhaler * Inhaled asthma medicine (neb or MDI) is needed more often than every 4 hours * Wheezing is not completely cleared by 5 days CARE ADVICE given per Asthma Attack (Adult) guideline. * You become worse.

## 2018-01-26 NOTE — Telephone Encounter (Signed)
Unable to reach pt at either contact # on TH note. Left v/m requesting pt to cb.

## 2018-01-26 NOTE — Patient Instructions (Signed)
We will call with X-ray and lab results.  Complete prednisone taper.  Start Singulair at bedtime.  Use albuterol as needed for wheeze and SOB.  Call if not improving as expected.

## 2018-01-26 NOTE — Telephone Encounter (Signed)
Tried to call pt x 3 and left v/m requesting pt to cb about appt and triage pt. pts DPR does not allow me to talk with anyone else.

## 2018-01-26 NOTE — Telephone Encounter (Signed)
[facility] Night - Client Nonclinical Telephone Record [facility] Center Client [facility] Night - Client Client Site [facility] [place] - Night Physician Tillman Abide - MD Contact Type Call Who Is Calling Patient / Member / Family / Caregiver Caller Name Joshua Levonne Spiller. Caller Phone Number [phone] Patient Name Joshua Cordova. Patient DOB Oct 12, 1970 Call Type Message Only Information Provided Reason for Call Request to Schedule Office Appointment Initial Comment Caller reports he is having shortness of breath and is requesting to make an appointment. Additional Comment Provided office Hours. Declined Nurse triage. Call Closed By: Brooke Pace Transaction Date/Time: 01/26/2018 6:54:23 AM (ET)

## 2018-01-26 NOTE — Assessment & Plan Note (Signed)
Significant response to albuterol inhaler... likely moderate persistent asthma with allergy trigger.  No clear infectious trigger. Given persistent cough and SOB in setting of former smoker... eval with CXR.   Will eval cbc to rule out anemia as cause given severe fatigue as well.  Plan treatment with prednisone and albuterol as needed.  Start Singulair for asthma/allergy control.

## 2018-02-09 ENCOUNTER — Telehealth: Payer: Self-pay | Admitting: Internal Medicine

## 2018-02-09 MED ORDER — FLUTICASONE PROPIONATE HFA 44 MCG/ACT IN AERO: 2.0000 | INHALATION_SPRAY | Freq: Two times a day (BID) | RESPIRATORY_TRACT | 12 refills | Status: DC

## 2018-02-09 NOTE — Telephone Encounter (Signed)
Prednisone is not a good longer term med, but if was better with steroids... I believe he would do well with an inhaled steroid. Start now and use daily for control of asthma... follow up with PCP as  Scheduled 2/10  I will send this in for pt to start in addition to  singulair  If severe SOB.Marland Kitchen he needs to be seen at urgent care if we are full.

## 2018-02-09 NOTE — Telephone Encounter (Signed)
Left detailed message on vm per DPR.  

## 2018-02-09 NOTE — Telephone Encounter (Signed)
Pt need refill on   Prednisone 20mg    Sent to [facility]

## 2018-02-09 NOTE — Telephone Encounter (Signed)
Spoke to pt. He said he has started back with wheezing and short of breath. The prednisone really helped while he was on it.

## 2018-02-10 NOTE — Telephone Encounter (Signed)
Please check on him on Monday I would try a 3-5 day course of prednisone prn--but agree he needs controller regimen

## 2018-02-12 MED ORDER — PREDNISONE 20 MG PO TABS: ORAL_TABLET | ORAL | 1 refills | Status: DC

## 2018-02-12 NOTE — Telephone Encounter (Signed)
Spoke to pt. He said he is still having wheezing at night. Could not afford the Flovent that was sent in 02-09-18.

## 2018-02-12 NOTE — Telephone Encounter (Signed)
Okay to send prednisone 40mg  daily x 3, then 20mg  daily x 3 (20mg  #9 x 1) Make sure he knows he can't stay on prednisone long term due to the side effects

## 2018-02-12 NOTE — Telephone Encounter (Signed)
Spoke to pt. Made him aware. He was asking for something to break stuff up and I suggested Mucinex. He will come in if not any better after the prednisone this time.

## 2018-02-12 NOTE — Addendum Note (Signed)
Addended by: Eual Fines on: 02/12/2018 02:46 PM   Modules accepted: Orders

## 2018-02-26 ENCOUNTER — Ambulatory Visit: Payer: Self-pay | Admitting: Internal Medicine

## 2018-03-05 ENCOUNTER — Other Ambulatory Visit: Payer: Self-pay | Admitting: Internal Medicine

## 2018-03-15 ENCOUNTER — Ambulatory Visit: Payer: Self-pay | Admitting: Internal Medicine

## 2018-04-09 ENCOUNTER — Telehealth: Payer: Self-pay | Admitting: Internal Medicine

## 2018-04-09 NOTE — Telephone Encounter (Signed)
I spoke to the pt and advised him that he had a rx sent in 03-05-18 1/1. I asked if he called the pharmacy to see if they had the rx. He said he called and was told there were no rxs. I called and spoke to Tania. She said there was the rx on 03-05-18 in his profile. She will get it ready for him. I called him back to let him know. He said he was using the automated system when he called and it said he had no refills. I explained that that was an old rx and he had to actually speak to someone to get it filled.

## 2018-04-09 NOTE — Telephone Encounter (Signed)
Spoke to patient by telephone and was advised that he has been without his inhaler for about a month. Patient stated that he has a history of SOB and bronchitis. Patient stated that he does not have a cough or fever and no other symptoms. Patient stated that he was up all night and is having some wheezing now. Patient stated that if he needs to come in to be seen he will. Last office visit 01/26/18 acute. Last refill 03/05/18  6.7 inhaler/1 refill Patient requested a call back when script has been sent in.

## 2018-04-09 NOTE — Telephone Encounter (Signed)
Best number  [phone] Pt called to get a refill on  Albuterol sulfate  [facility]   Pt is complete out

## 2018-04-16 ENCOUNTER — Encounter: Payer: Self-pay | Admitting: Internal Medicine

## 2018-04-16 ENCOUNTER — Ambulatory Visit (INDEPENDENT_AMBULATORY_CARE_PROVIDER_SITE_OTHER): Payer: Self-pay | Admitting: Internal Medicine

## 2018-04-16 ENCOUNTER — Other Ambulatory Visit: Payer: Self-pay

## 2018-04-16 DIAGNOSIS — J4520 Mild intermittent asthma, uncomplicated: Secondary | ICD-10-CM

## 2018-04-16 NOTE — Assessment & Plan Note (Signed)
Ongoing problems Probably needs steroid inhaler--but can't afford the price he was quoted for the flovent Discussed using spacer for the albuterol Add the cetirizine Continue the montelukast Will have to work with pharmacist to find best price if inhaled steroid needed

## 2018-04-16 NOTE — Progress Notes (Signed)
   Subjective:    Patient ID: Joshua Cordova, male    DOB: [birth year], 48 y.o.   MRN: [id number]  HPI Virtual Visit via Telephone Note  I connected with Joshua Cordova on 04/16/18 at  2:45 PM EDT by telephone and verified that I am speaking with the correct person using two identifiers.   I discussed the limitations, risks, security and privacy concerns of performing an evaluation and management service by telephone and the availability of in person appointments. I also discussed with the patient that there may be a patient responsible charge related to this service. The patient expressed understanding and agreed to proceed.  Interactive audio and video telecommunications were attempted between this provider and patient, however failed, due to patient having technical difficulties OR patient did not have access to video capability.  We continued and completed visit with audio only.   History of Present Illness: Patient is in his home. I am in my office  Started having episodes of shortness of breath---mostly in bed Started as much as a year ago Will cough stuff out of his chest ---and it improves Prednisone does help Is now on montelukast----for a week or so. Seemed to help him at first---but seemed to wear off last night Staying in house---does have spring pollen sensitivity Has zyrtec but afraid to add it No fever He does notice some rattling in chest at times--"like pop rocks" Not really coughing  Appetite is down some--not sleeping well Drinking eucalyptus tea Uses the albuterol inhaler at times---using at bedtime and will help briefly   Observations/Objective: No dyspnea apparent Normal voice  Assessment and Plan: Asthma---allergic  Follow Up Instructions: See problem list   I discussed the assessment and treatment plan with the patient. The patient was provided an opportunity to ask questions and all were answered. The patient agreed with the plan and demonstrated an  understanding of the instructions.   The patient was advised to call back or seek an in-person evaluation if the symptoms worsen or if the condition fails to improve as anticipated.  I provided 15 minutes of non-face-to-face time during this encounter.   Tillman Abide, MD    Review of Systems     Objective:   Physical Exam         Assessment & Plan:

## 2018-04-17 ENCOUNTER — Other Ambulatory Visit: Payer: Self-pay

## 2018-04-17 ENCOUNTER — Ambulatory Visit (INDEPENDENT_AMBULATORY_CARE_PROVIDER_SITE_OTHER)
Admission: RE | Admit: 2018-04-17 | Discharge: 2018-04-17 | Disposition: A | Discharge status: Home / Self Care | Payer: Self-pay | Source: Ambulatory Visit | Attending: Internal Medicine | Admitting: Internal Medicine

## 2018-04-17 ENCOUNTER — Telehealth: Payer: Self-pay

## 2018-04-17 ENCOUNTER — Encounter: Payer: Self-pay | Admitting: Internal Medicine

## 2018-04-17 ENCOUNTER — Ambulatory Visit: Payer: Self-pay | Admitting: Internal Medicine

## 2018-04-17 VITALS — BP 122/86 | HR 97 | Temp 97.9°F | Ht 71.0 in | Wt 232.0 lb

## 2018-04-17 DIAGNOSIS — R0609 Other forms of dyspnea: Secondary | ICD-10-CM | POA: Insufficient documentation

## 2018-04-17 DIAGNOSIS — I509 Heart failure, unspecified: Secondary | ICD-10-CM | POA: Insufficient documentation

## 2018-04-17 LAB — CBC
HCT: 44.5 % (ref 39.0–52.0)
Hemoglobin: 15.1 g/dL (ref 13.0–17.0)
MCHC: 34 g/dL (ref 30.0–36.0)
MCV: 90.2 fl (ref 78.0–100.0)
Platelets: 182 10*3/uL (ref 150.0–400.0)
RBC: 4.93 Mil/uL (ref 4.22–5.81)
RDW: 14.3 % (ref 11.5–15.5)
WBC: 11.6 10*3/uL — ABNORMAL HIGH (ref 4.0–10.5)

## 2018-04-17 LAB — COMPREHENSIVE METABOLIC PANEL
ALT: 30 U/L (ref 0–53)
AST: 14 U/L (ref 0–37)
Albumin: 4 g/dL (ref 3.5–5.2)
Alkaline Phosphatase: 58 U/L (ref 39–117)
BUN: 11 mg/dL (ref 6–23)
CHLORIDE: 103 meq/L (ref 96–112)
CO2: 28 mEq/L (ref 19–32)
Calcium: 9.3 mg/dL (ref 8.4–10.5)
Creatinine, Ser: 1.06 mg/dL (ref 0.40–1.50)
GFR: 90.87 mL/min (ref >60.00)
Glucose, Bld: 154 mg/dL — ABNORMAL HIGH (ref 70–99)
Potassium: 4.1 mEq/L (ref 3.5–5.1)
Sodium: 140 mEq/L (ref 135–145)
Total Bilirubin: 0.8 mg/dL (ref 0.2–1.2)
Total Protein: 6.8 g/dL (ref 6.0–8.3)

## 2018-04-17 LAB — LIPID PANEL
Cholesterol: 198 mg/dL (ref 0–200)
HDL: 27.5 mg/dL — ABNORMAL LOW (ref >39.00)
LDL Cholesterol: 140 mg/dL — ABNORMAL HIGH (ref 0–99)
NonHDL: 170.89
Total CHOL/HDL Ratio: 7
Triglycerides: 156 mg/dL — ABNORMAL HIGH (ref 0.0–149.0)
VLDL: 31.2 mg/dL (ref 0.0–40.0)

## 2018-04-17 LAB — T4, FREE: Free T4: 1.11 ng/dL (ref 0.60–1.60)

## 2018-04-17 MED ORDER — FUROSEMIDE 40 MG PO TABS: 40.0000 mg | ORAL_TABLET | Freq: Every day | ORAL | 3 refills | Status: DC

## 2018-04-17 NOTE — Telephone Encounter (Signed)
Per Alphonsus Sias for urgent referral Chronic CHF please advise for Evisit vs in office

## 2018-04-17 NOTE — Telephone Encounter (Signed)
Received inbound call from patient reporting continued concerns with shortness of breath and new onset of swelling in both feet.  Note: patient had appointment with PCP regarding SOB on 04/16/18.   PCP made aware. Office visit scheduled 04/17/18 @ 1130.

## 2018-04-17 NOTE — Assessment & Plan Note (Signed)
CXR shows some pleural fluid in fissures and increased markings. Large heart---awaiting official reading EKG shows sinus at 94, LAHB, poor R wave progression. Likely LAH and LVH. T wave inversions laterally----?ischemia vs LVH. No comparison  Will start furosemide 40mg  daily Urgent cardiology referral---will need echo, etc No insurance --will need to apply for the [facility] discount

## 2018-04-17 NOTE — Assessment & Plan Note (Signed)
Along with significant paroxysmal nocturnal dyspnea and some edema now Has increased JVD and hepatomegaly Has had to quit 2 jobs due to severe dyspnea (but kept relating it to his asthma) Whole picture is consistent with CHF Will check EKG, labs, CXR

## 2018-04-17 NOTE — Telephone Encounter (Signed)
fyi patient scheduled

## 2018-04-17 NOTE — Telephone Encounter (Signed)
Spoke with patient and reviewed that due to recent COVID we are doing virtual visits with telephone or video. He was agreeable to telephone visit and reviewed consent in detail and approved. He was appreciative for the call with no further questions at this time.  YOUR CARDIOLOGY TEAM HAS ARRANGED FOR AN E-VISIT FOR YOUR APPOINTMENT - PLEASE REVIEW IMPORTANT INFORMATION BELOW SEVERAL DAYS PRIOR TO YOUR APPOINTMENT  Due to the recent COVID-19 pandemic, we are transitioning in-person office visits to tele-medicine visits in an effort to decrease unnecessary exposure to our patients and staff. Medicare and most insurances are covering these visits without a copay needed. We also encourage you to sign up for MyChart if you have not already done so. You will need a smartphone if possible. For patients that do not have this, we can still complete the visit using a regular telephone but do prefer a smartphone to enable video when possible. You may have a close family member that lives with you that can help. If possible, we also ask that you have a blood pressure cuff and scale at home to measure your blood pressure, heart rate and weight prior to your scheduled appointment. Patients with clinical needs that need an in-person evaluation and testing will still be able to come to the office if absolutely necessary. If you have any questions, feel free to call our office.   2-3 DAYS BEFORE YOUR APPOINTMENT  You will receive a telephone call from one of our [facility] team members - your caller ID may say "Unknown caller." If this is a video visit, we will confirm that you have been able to download the WebEx app. We will remind you check your blood pressure, heart rate and weight prior to your scheduled appointment. If you have an Apple Watch or Kardia, please upload any pertinent ECG strips the day before or morning of your appointment to MyChart. Our staff will also make sure you have reviewed the consent and agree  to move forward with your scheduled tele-health visit.     THE DAY OF YOUR APPOINTMENT  Approximately 15 minutes prior to your scheduled appointment, you will receive a telephone call from one of [facility] team - your caller ID may say "Unknown caller."  Our staff will confirm medications, vital signs for the day and any symptoms you may be experiencing. Please have this information available prior to the time of visit start. It may also be helpful for you to have a pad of paper and pen handy for any instructions given during your visit. They will also walk you through joining the WebEx smartphone meeting if this is a video visit.    CONSENT FOR TELE-HEALTH VISIT - PLEASE REVIEW  I hereby voluntarily request, consent and authorize [facility] and its employed or contracted physicians, physician assistants, nurse practitioners or other licensed health care professionals (the Practitioner), to provide me with telemedicine health care services (the "Services") as deemed necessary by the treating Practitioner. I acknowledge and consent to receive the Services by the Practitioner via telemedicine. I understand that the telemedicine visit will involve communicating with the Practitioner through live audiovisual communication technology and the disclosure of certain medical information by electronic transmission. I acknowledge that I have been given the opportunity to request an in-person assessment or other available alternative prior to the telemedicine visit and am voluntarily participating in the telemedicine visit.  I understand that I have the right to withhold or withdraw my consent to the use of telemedicine  in the course of my care at any time, without affecting my right to future care or treatment, and that the Practitioner or I may terminate the telemedicine visit at any time. I understand that I have the right to inspect all information obtained and/or recorded in the course of the telemedicine  visit and may receive copies of available information for a reasonable fee.  I understand that some of the potential risks of receiving the Services via telemedicine include:  Marland Kitchen Delay or interruption in medical evaluation due to technological equipment failure or disruption; . Information transmitted may not be sufficient (e.g. poor resolution of images) to allow for appropriate medical decision making by the Practitioner; and/or  . In rare instances, security protocols could fail, causing a breach of personal health information.  Furthermore, I acknowledge that it is my responsibility to provide information about my medical history, conditions and care that is complete and accurate to the best of my ability. I acknowledge that Practitioner's advice, recommendations, and/or decision may be based on factors not within their control, such as incomplete or inaccurate data provided by me or distortions of diagnostic images or specimens that may result from electronic transmissions. I understand that the practice of medicine is not an exact science and that Practitioner makes no warranties or guarantees regarding treatment outcomes. I acknowledge that I will receive a copy of this consent concurrently upon execution via email to the email address I last provided but may also request a printed copy by calling the office of [facility].    I understand that my insurance will be billed for this visit.   I have read or had this consent read to me. . I understand the contents of this consent, which adequately explains the benefits and risks of the Services being provided via telemedicine.  . I have been provided ample opportunity to ask questions regarding this consent and the Services and have had my questions answered to my satisfaction. . I give my informed consent for the services to be provided through the use of telemedicine in my medical care  By participating in this telemedicine visit I agree to the  above.

## 2018-04-17 NOTE — Progress Notes (Signed)
Subjective:    Patient ID: Joshua Cordova, male    DOB: Oct 17, 1970, 48 y.o.   MRN: [id number]  HPI Here due to ongoing SOB and new foot swelling  Goes back a year----will have SOB mostly in bed Also gets "a sick feeling" in his chest Gets a "hard heartbeat, then sick feeling" then it goes back to normal Can occur 5-6 times at different times in the day SOB is mostly at night---if he lies down he hears "crackling" Feels better if he sits up  No chest pain No dizziness Still gets easy DOE SOB even just bending over to put on shoes  Noted foot swelling yesterday Only took the amlodipine once this morning--not other than that in the past week No pain Did take the prednisone I prescribed---finished it a few days ago  Current Outpatient Medications on File Prior to Visit  Medication Sig Dispense Refill   albuterol (PROVENTIL HFA;VENTOLIN HFA) 108 (90 Base) MCG/ACT inhaler TAKE 2 PUFFS BY MOUTH EVERY 6 HOURS AS NEEDED FOR WHEEZE OR SHORTNESS OF BREATH 6.7 Inhaler 1   amLODipine (NORVASC) 5 MG tablet Take 1 tablet (5 mg total) by mouth daily. 90 tablet 3   glucose blood (GE100 BLOOD GLUCOSE TEST) test strip Test daily or as directed 100 each 12   montelukast (SINGULAIR) 10 MG tablet Take 1 tablet (10 mg total) by mouth at bedtime. 30 tablet 3   No current facility-administered medications on file prior to visit.     No Known Allergies  Past Medical History:  Diagnosis Date   Hypertension     History reviewed. No pertinent surgical history.  Family History  Problem Relation Age of Onset   Diabetes Mother    Hypertension Father    Diabetes Maternal Grandfather    Heart disease Other     Social History   Socioeconomic History   Marital status: Single    Spouse name: Not on file   Number of children: Not on file   Years of education: Not on file   Highest education level: Not on file  Occupational History   Occupation:  Shipping    Comment: Secondary school teacher     Occupation:    Social Network engineer strain: Not on file   Food insecurity:    Worry: Not on file    Inability: Not on file   Transportation needs:    Medical: Not on file    Non-medical: Not on file  Tobacco Use   Smoking status: Former Smoker    Packs/day: 1.00    Types: Cigarettes   Smokeless tobacco: Never Used  Substance and Sexual Activity   Alcohol use: No   Drug use: Not on file   Sexual activity: Not on file  Lifestyle   Physical activity:    Days per week: Not on file    Minutes per session: Not on file   Stress: Not on file  Relationships   Social connections:    Talks on phone: Not on file    Gets together: Not on file    Attends religious service: Not on file    Active member of club or organization: Not on file    Attends meetings of clubs or organizations: Not on file    Relationship status: Not on file   Intimate partner violence:    Fear of current or ex partner: Not on file    Emotionally abused: Not on file    Physically abused: Not on  file    Forced sexual activity: Not on file  Other Topics Concern   Not on file  Social History Narrative   Not on file   Review of Systems Does weight daily--but has been stable lately Only taking the amlodipine intermittently    Objective:   Physical Exam  Constitutional: He appears well-developed. No distress.  Neck: JVD present. No thyromegaly present.  Cardiovascular: Normal rate, regular rhythm and normal heart sounds. Exam reveals no gallop and no friction rub.  No murmur heard. Respiratory: No respiratory distress. He has no wheezes. He has no rales.  Dullness and decreased breath sounds at bases  GI: He exhibits no distension. There is no abdominal tenderness. There is no rebound and no guarding.  Liver down 3cm from [facility]  Musculoskeletal:     Comments: Trace pedal edema  Lymphadenopathy:    He has no cervical adenopathy.           Assessment & Plan:

## 2018-04-18 ENCOUNTER — Telehealth: Payer: Self-pay | Admitting: *Deleted

## 2018-04-18 NOTE — Progress Notes (Signed)
Patient notified about the result and reported improvement after the initial furosemide

## 2018-04-18 NOTE — Telephone Encounter (Signed)
-----   Message from Norman Herrlich sent at 04/17/2018  2:25 PM EDT ----- Regarding: Urgent referral - please review Received an urgent referral from Dr Alphonsus Sias. Please review and advise on appointment.In office or evisit.

## 2018-04-19 NOTE — Progress Notes (Addendum)
Virtual Visit via Video Note   This visit type was conducted due to national recommendations for restrictions regarding the COVID-19 Pandemic (e.g. social distancing) in an effort to limit this patient's exposure and mitigate transmission in our community.  Due to her co-morbid illnesses, this patient is at least at moderate risk for complications without adequate follow up.  This format is felt to be most appropriate for this patient at this time.  All issues noted in this document were discussed and addressed.  A limited physical exam was performed with this format.  Please refer to the patient's chart for her consent to telehealth for [facility].    Date:  04/19/2018   ID:  Joshua Cordova, DOB 1971-01-18, MRN [id number]  Patient Location:  [address] [place] [address]   Provider location:   [facility], [place] office  PCP:  Karie Schwalbe, MD  Cardiologist:  Hubbard Robinson [facility]  Chief Complaint:  Shortness of breath, leg swelling  New Patient, webcam visit using [email or web address]  History of Present Illness:    Andriy Purser is a 48 y.o. male who presents via audio/video conferencing for a telehealth visit today.   The patient does not symptoms concerning for COVID-19 infection (fever, chills, cough, or new SHORTNESS OF BREATH).   Patient has a past medical history of Diabetes Obesity Former smoker asthma Who presents by referral from Dr.  Alphonsus Sias for SOB , swelling both feet  He reports having 3 months of shortness of breath More recently with lower extremity edema  Several trips to primary care for shortness of breath felt secondary to asthma Reports that treatments for asthma did not seem to help his breathing  Gave up on 2 jobs because he could not breathe, worried he was allergic to something Symptoms were getting worse Seen by Dr. Alphonsus Sias on 04/17/2018 CXR ordered shows some pleural fluid in fissures and increased markings. Large heart---awaiting  official reading  Started on furosemide 40mg  daily  Reports that after 1 day he started to feel better Was able to lay flat without gasping and coughing  Lab work reviewed with him in detail  total chol 198, ldl 140 CR 1.06, glucose 154  EKG performed April 17, 2018 reviewed personally by myself Showing normal sinus rhythm rate 94 bpm LVH, nonspecific T wave abnormality anterolateral leads  Weight 232 at office Has not weighed himself at home, does not have a scale  He may have an old blood pressure cuff at home   Prior CV studies:   The following studies were reviewed today:  CXR heart remains borderline enlarged. Normal mediastinal contours. Mild peripheral interstitial thickening at the lung bases is not significantly changed. Unchanged mild thickening of the fissures. No focal consolidation, pleural effusion, or pneumothorax. No acute osseous abnormality. IMPRESSION: 1. Unchanged mild interstitial pulmonary edema.  Past Medical History:  Diagnosis Date  . Hypertension    No past surgical history on file.   No outpatient medications have been marked as taking for the 04/20/18 encounter (Appointment) with [name], MD.     Allergies:   Patient has no known allergies.   Social History   Tobacco Use  . Smoking status: Former Smoker    Packs/day: 1.00    Types: Cigarettes  . Smokeless tobacco: Never Used  Substance Use Topics  . Alcohol use: No  . Drug use: Not on file     Current Outpatient Medications on File Prior to Visit  Medication  Sig Dispense Refill  . albuterol (PROVENTIL HFA;VENTOLIN HFA) 108 (90 Base) MCG/ACT inhaler TAKE 2 PUFFS BY MOUTH EVERY 6 HOURS AS NEEDED FOR WHEEZE OR SHORTNESS OF BREATH 6.7 Inhaler 1  . amLODipine (NORVASC) 5 MG tablet Take 1 tablet (5 mg total) by mouth daily. 90 tablet 3  . furosemide (LASIX) 40 MG tablet Take 1 tablet (40 mg total) by mouth daily. 30 tablet 3  . glucose blood (GE100 BLOOD GLUCOSE TEST) test  strip Test daily or as directed 100 each 12  . montelukast (SINGULAIR) 10 MG tablet Take 1 tablet (10 mg total) by mouth at bedtime. 30 tablet 3   No current facility-administered medications on file prior to visit.      Family Hx: The patient's family history includes Diabetes in his maternal grandfather and mother; Heart disease in an other family member; Hypertension in his father.  ROS:   Please see the history of present illness.    Review of Systems  Constitutional: Negative.   Respiratory: Positive for cough and shortness of breath.   Cardiovascular: Positive for leg swelling.  Gastrointestinal: Negative.   Musculoskeletal: Negative.   Neurological: Negative.   Psychiatric/Behavioral: Negative.   All other systems reviewed and are negative.     Labs/Other Tests and Data Reviewed:    Recent Labs: 04/17/2018: ALT 30; BUN 11; Creatinine, Ser 1.06; Hemoglobin 15.1; Platelets 182.0; Potassium 4.1; Sodium 140   Recent Lipid Panel Lab Results  Component Value Date/Time   CHOL 198 04/17/2018 12:02 PM   TRIG 156.0 (H) 04/17/2018 12:02 PM   HDL 27.50 (L) 04/17/2018 12:02 PM   CHOLHDL 7 04/17/2018 12:02 PM   LDLCALC 140 (H) 04/17/2018 12:02 PM   LDLDIRECT 162.0 09/06/2016 12:34 PM    Wt Readings from Last 3 Encounters:  04/17/18 232 lb (105.2 kg)  01/26/18 232 lb 8 oz (105.5 kg)  09/15/17 219 lb (99.3 kg)     Exam:    Vital Signs: Vital signs as detailed above in HPI 120 systolic on recent office visit Heart rates typically 90-100  Well nourished, well developed male in no acute distress. Constitutional:  oriented to person, place, and time. No distress.  Head: Normocephalic and atraumatic.  Eyes:  no discharge. No scleral icterus.  Neck: Normal range of motion. Neck supple.  Pulmonary/Chest: No audible wheezing, no distress, appears comfortable Musculoskeletal: Normal range of motion.  no  tenderness or deformity.  Neurological:   Coordination normal. Full exam  not performed Skin:  No rash Psychiatric:  normal mood and affect. behavior is normal. Thought content normal.    ASSESSMENT & PLAN:    Chronic congestive heart failure, unspecified heart failure type (HCC) Suspect acute on chronic diastolic CHF Unable to definitively exclude problem with cardiac function Will order echocardiogram He is very eager to find out the results given the progressive nature of his symptoms -We have not made any medication changes at this time He prefers " natural things first" Ideally would probably change his amlodipine to losartan 50 mg daily Could even do losartan with touch of HCTZ and make Lasix as needed We will wait for echocardiograms first before making medication changes  Morbid obesity (HCC) We have encouraged continued exercise, careful diet management in an effort to lose weight.  Long discussion concerning his diet, elevated sugars, need for low carbohydrates  Essential hypertension Blood pressure actually looks reasonable last several office visit systolic pressure in the 120 range He may have a blood pressure cuff at home  that he can used to monitor numbers  Diabetes mellitus type 2, diet-controlled (HCC) Discussed his elevated glucose levels In the past was very strict and got his sugars down Has been lax with his diet and with no activity Recommended low carbohydrates   COVID-19 Education: The signs and symptoms of COVID-19 were discussed with the patient and how to seek care for testing (follow up with PCP or arrange E-visit).  The importance of social distancing was discussed today.  Patient Risk:   After full review of this patients clinical status, I feel that they are at least moderate risk at this time.  Time:   Today, I have spent 25 minutes with the patient with telehealth technology discussing diastolic CHF, CHF management in general, need for further work-up including echocardiogram, discussed various medications that could  be used, discussed monitoring his weight on a daily basis and adjusting diuretics depending on his weight, discussed possible sleep apnea in connection with obesity.     Medication Adjustments/Labs and Tests Ordered: Current medicines are reviewed at length with the patient today.  Concerns regarding medicines are outlined above.   Tests Ordered: No tests ordered   Medication Changes: No changes made   Disposition: Follow-up in 1 month evisit   Signed, Julien Nordmann, MD  04/19/2018 9:52 PM    [facility] [address], [place], [place] [address]

## 2018-04-19 NOTE — Telephone Encounter (Signed)
Virtual visit scheduled on 04/20/18 with Dr.Gollan

## 2018-04-20 ENCOUNTER — Telehealth (INDEPENDENT_AMBULATORY_CARE_PROVIDER_SITE_OTHER): Payer: Self-pay | Admitting: Cardiovascular Disease

## 2018-04-20 ENCOUNTER — Other Ambulatory Visit: Payer: Self-pay

## 2018-04-20 DIAGNOSIS — E119 Type 2 diabetes mellitus without complications: Secondary | ICD-10-CM

## 2018-04-20 DIAGNOSIS — E1129 Type 2 diabetes mellitus with other diabetic kidney complication: Secondary | ICD-10-CM | POA: Insufficient documentation

## 2018-04-20 DIAGNOSIS — E1165 Type 2 diabetes mellitus with hyperglycemia: Secondary | ICD-10-CM

## 2018-04-20 DIAGNOSIS — IMO0001 Reserved for inherently not codable concepts without codable children: Secondary | ICD-10-CM

## 2018-04-20 DIAGNOSIS — R0602 Shortness of breath: Secondary | ICD-10-CM

## 2018-04-20 DIAGNOSIS — I10 Essential (primary) hypertension: Secondary | ICD-10-CM

## 2018-04-20 DIAGNOSIS — I509 Heart failure, unspecified: Secondary | ICD-10-CM

## 2018-04-20 NOTE — Patient Instructions (Addendum)
Medication Instructions:  No changes  If you need a refill on your cardiac medications before your next appointment, please call your pharmacy.    Lab work: No new labs needed   If you have labs (blood work) drawn today and your tests are completely normal, you will receive your results only by: Marland Kitchen MyChart Message (if you have MyChart) OR . A paper copy in the mail If you have any lab test that is abnormal or we need to change your treatment, we will call you to review the results.   Testing/Procedures: We will schedule echocardiogram for shortness of breath, congestive heart failure Your physician has requested that you have an echocardiogram (NEEDS, CANNOT BE PUSHED OUT TO LATER DATE). Echocardiography is a painless test that uses sound waves to create images of your heart. It provides your doctor with information about the size and shape of your heart and how well your heart's chambers and valves are working. This procedure takes approximately one hour. There are no restrictions for this procedure. You may get an IV, if needed, to receive an ultrasound enhancing agent through to better visualize your heart.    Follow-Up: At [facility], you and your health needs are our priority.  As part of our continuing mission to provide you with exceptional heart care, we have created designated Provider Care Teams.  These Care Teams include your primary Cardiologist (physician) and Advanced Practice Providers (APPs -  Physician Assistants and Nurse Practitioners) who all work together to provide you with the care you need, when you need it.  . You will need a follow up appointment in 1 months evisit .   Please call our office 2 months in advance to schedule this appointment.    . Providers on your designated Care Team:   . Nicolasa Ducking, NP . Eula Listen, PA-C . Marisue Ivan, PA-C  Any Other Special Instructions Will Be Listed Below (If Applicable).  For educational health  videos Log in to : [email or web address] Or : [email or web address], password : [email or web address]     Echocardiogram An echocardiogram is a procedure that uses painless sound waves (ultrasound) to produce an image of the heart. Images from an echocardiogram can provide important information about:  Signs of coronary artery disease (CAD).  Aneurysm detection. An aneurysm is a weak or damaged part of an artery wall that bulges out from the normal force of blood pumping through the body.  Heart size and shape. Changes in the size or shape of the heart can be associated with certain conditions, including heart failure, aneurysm, and CAD.  Heart muscle function.  Heart valve function.  Signs of a past heart attack.  Fluid buildup around the heart.  Thickening of the heart muscle.  A tumor or infectious growth around the heart valves. Tell a health care provider about:  Any allergies you have.  All medicines you are taking, including vitamins, herbs, eye drops, creams, and over-the-counter medicines.  Any blood disorders you have.  Any surgeries you have had.  Any medical conditions you have.  Whether you are pregnant or may be pregnant. What are the risks? Generally, this is a safe procedure. However, problems may occur, including:  Allergic reaction to dye (contrast) that may be used during the procedure. What happens before the procedure? No specific preparation is needed. You may eat and drink normally. What happens during the procedure?   An IV tube may be inserted into one of your veins.  You may receive contrast through  this tube. A contrast is an injection that improves the quality of the pictures from your heart.  A gel will be applied to your chest.  A wand-like tool (transducer) will be moved over your chest. The gel will help to transmit the sound waves from the transducer.  The sound waves will harmlessly bounce off of your heart to allow the heart images to be captured in  real-time motion. The images will be recorded on a computer. The procedure may vary among health care providers and hospitals. What happens after the procedure?  You may return to your normal, everyday life, including diet, activities, and medicines, unless your health care provider tells you not to do that. Summary  An echocardiogram is a procedure that uses painless sound waves (ultrasound) to produce an image of the heart.  Images from an echocardiogram can provide important information about the size and shape of your heart, heart muscle function, heart valve function, and fluid buildup around your heart.  You do not need to do anything to prepare before this procedure. You may eat and drink normally.  After the echocardiogram is completed, you may return to your normal, everyday life, unless your health care provider tells you not to do that. This information is not intended to replace advice given to you by your health care provider. Make sure you discuss any questions you have with your health care provider. Document Released: 01/01/2000 Document Revised: 02/06/2016 Document Reviewed: 02/06/2016 Elsevier Interactive Patient Education  2019 [employer].

## 2018-04-24 ENCOUNTER — Other Ambulatory Visit: Payer: Self-pay

## 2018-04-24 ENCOUNTER — Ambulatory Visit (INDEPENDENT_AMBULATORY_CARE_PROVIDER_SITE_OTHER): Payer: Self-pay

## 2018-04-24 DIAGNOSIS — R0602 Shortness of breath: Secondary | ICD-10-CM

## 2018-04-24 DIAGNOSIS — I509 Heart failure, unspecified: Secondary | ICD-10-CM

## 2018-05-03 ENCOUNTER — Other Ambulatory Visit: Payer: Self-pay

## 2018-05-03 ENCOUNTER — Telehealth (INDEPENDENT_AMBULATORY_CARE_PROVIDER_SITE_OTHER): Payer: Self-pay | Admitting: Cardiovascular Disease

## 2018-05-03 ENCOUNTER — Telehealth: Payer: Self-pay

## 2018-05-03 DIAGNOSIS — I420 Dilated cardiomyopathy: Secondary | ICD-10-CM

## 2018-05-03 DIAGNOSIS — I5022 Chronic systolic (congestive) heart failure: Secondary | ICD-10-CM

## 2018-05-03 DIAGNOSIS — I5023 Acute on chronic systolic (congestive) heart failure: Secondary | ICD-10-CM | POA: Insufficient documentation

## 2018-05-03 DIAGNOSIS — IMO0001 Reserved for inherently not codable concepts without codable children: Secondary | ICD-10-CM

## 2018-05-03 DIAGNOSIS — E118 Type 2 diabetes mellitus with unspecified complications: Secondary | ICD-10-CM

## 2018-05-03 DIAGNOSIS — E1165 Type 2 diabetes mellitus with hyperglycemia: Secondary | ICD-10-CM

## 2018-05-03 MED ORDER — LOSARTAN POTASSIUM 25 MG PO TABS: 25.0000 mg | ORAL_TABLET | Freq: Every day | ORAL | 1 refills | Status: DC

## 2018-05-03 MED ORDER — METOPROLOL SUCCINATE ER 25 MG PO TB24: 25.0000 mg | ORAL_TABLET | Freq: Every day | ORAL | 1 refills | Status: DC

## 2018-05-03 MED ORDER — FUROSEMIDE 40 MG PO TABS: 40.0000 mg | ORAL_TABLET | Freq: Every day | ORAL | 1 refills | Status: DC

## 2018-05-03 NOTE — Patient Instructions (Addendum)
Medication Instructions:  Please start metoprolol succinate 25 mg once a day Start Losartan 25 mg once a day Restart lasix 40 mg daily  If you need a refill on your cardiac medications before your next appointment, please call your pharmacy.    Lab work: No new labs needed   Testing/Procedures: Your physician has requested that you have an exercise stress myoview to be scheduled in a month or once the virus has resolved. For further information please visit [email or web address] Please follow instruction sheet, as given.   Follow-Up: At [facility], you and your health needs are our priority.  As part of our continuing mission to provide you with exceptional heart care, we have created designated Provider Care Teams.  These Care Teams include your primary Cardiologist (physician) and Advanced Practice Providers (APPs -  Physician Assistants and Nurse Practitioners) who all work together to provide you with the care you need, when you need it.  . You will need a follow up appointment in 6 weeks   . Providers on your designated Care Team:   . Nicolasa Ducking, NP . Eula Listen, PA-C . Marisue Ivan, PA-C  Any Other Special Instructions Will Be Listed Below (If Applicable).  For educational health videos Log in to : [email or web address] Or : [email or web address], password : [email or web address]   [facility] MYOVIEW  Your provider has ordered a Stress Test with nuclear imaging. The purpose of this test is to evaluate the blood supply to your heart muscle. This procedure is referred to as a "Non-Invasive Stress Test." This is because other than having an IV started in your vein, nothing is inserted or "invades" your body. Cardiac stress tests are done to find areas of poor blood flow to the heart by determining the extent of coronary artery disease (CAD). Some patients exercise on a treadmill, which naturally increases the blood flow to your heart, while others who are unable to walk on a treadmill due to physical  limitations have a pharmacologic/chemical stress agent called Lexiscan . This medicine will mimic walking on a treadmill by temporarily increasing your coronary blood flow.   Please note: these test may take anywhere between 2-4 hours to complete  PLEASE REPORT TO [facility] MEDICAL MALL ENTRANCE  THE VOLUNTEERS AT THE FIRST DESK WILL DIRECT YOU WHERE TO GO  Date of Procedure:_____________________________________  Arrival Time for Procedure:______________________________  Instructions regarding medication:   __X__:  Hold betablocker(s) night before procedure and morning of procedure (Metoprolol Succinate)  __X__:  Hold other medications as follows: Furosemide the morning of the test.  PLEASE NOTIFY THE OFFICE AT LEAST 24 HOURS IN ADVANCE IF YOU ARE UNABLE TO KEEP YOUR APPOINTMENT.  [phone] AND  PLEASE NOTIFY NUCLEAR MEDICINE AT [facility] AT LEAST 24 HOURS IN ADVANCE IF YOU ARE UNABLE TO KEEP YOUR APPOINTMENT. [phone]  How to prepare for your Myoview test:  1. Do not eat or drink after midnight 2. No caffeine for 24 hours prior to test 3. No smoking 24 hours prior to test. 4. Your medication may be taken with water.  If your doctor stopped a medication because of this test, do not take that medication. 5. Ladies, please do not wear dresses.  Skirts or pants are appropriate. Please wear a short sleeve shirt. 6. No perfume, cologne or lotion. 7. Wear comfortable walking shoes. No heels!

## 2018-05-03 NOTE — Telephone Encounter (Signed)
Spoke with patient.  Made him aware that [name] would like to have a EVISIT with him today to discuss ECHO results.  Appointment was made for 05/03/2018 @ 11:40am,

## 2018-05-03 NOTE — Telephone Encounter (Signed)
-----   Message from [name], MD sent at 05/02/2018 10:08 PM EDT ----- Echo EF is way down, Need to go over results and discuss medication changes Can we put him on Thursday with me for evisit if he is available thx TG

## 2018-05-03 NOTE — Progress Notes (Signed)
Telehealth Visit     This visit type was conducted due to national recommendations for restrictions regarding the COVID-19 Pandemic (e.g. social distancing) in an effort to limit this patient's exposure and mitigate transmission in our community.  Due to his co-morbid illnesses, this patient is at least at moderate risk for complications without adequate follow up.  This format is felt to be most appropriate for this patient at this time.  The patient did not have access to video technology/had technical difficulties with video requiring transitioning to audio format only (telephone).  All issues noted in this document were discussed and addressed.  No physical exam could be performed with this format.  Please refer to the patient's chart for his  consent to telehealth for [facility].   I connected with  Joshua Cordova on 05/03/18 by a video enabled telemedicine application and verified that I am speaking with the correct person using two identifiers. I discussed the limitations of evaluation and management by telemedicine. The patient expressed understanding and agreed to proceed.   Evaluation Performed:  Follow-up visit  Date:  05/03/2018   ID:  Joshua Cordova, DOB [birth year], MRN [id number]  Patient Location:  [address] [place] [address]   Provider location:   [facility], [place] office  PCP:  Karie Schwalbe, MD  Cardiologist:  [facility]   Chief Complaint: Shortness of breath leg swelling    History of Present Illness:    Joshua Cordova is a 48 y.o. male who presents via audio/video conferencing for a telehealth visit today.   The patient does not symptoms concerning for COVID-19 infection (fever, chills, cough, or new SHORTNESS OF BREATH).   Patient has a past medical history of Diabetes Obesity Former smoker, quit 2 years ago, smoked for 20 years total Asthma Cardiomyopathy on echocardiogram Recent presentation with SOB , swelling  both feet Presents today for for follow-up of his heart failure  Results of his echocardiogram discussed with him in detail Echocardiogram showing ejection fraction 25 to 30% Severely elevated right heart pressures 68 mmHg Mild to moderate TR Moderately dilated left atrium  Never been tested for sleep study Denies having chest pain concerning for angina  Feels fine currently Stopped all the medications on his own "Stop the medications to see how his heart really looked on echocardiogram" Reports the edema is improved   No scale to measure his weight at home Reports he is cut back on his fluid and salt intake  Lab work reviewed with him HBA1C from 12.8 down 6.9 Total chol 223 to 165,    Prior CV studies:   The following studies were reviewed today:  Review of records shows  3 months of shortness of breath recently with lower extremity edema  Several trips to primary care for shortness of breath felt secondary to asthma Reports that treatments for asthma did not seem to help his breathing  Gave up on 2 jobs because he could not breathe, worried he was allergic to something Symptoms were getting worse Seen by Dr. Alphonsus Sias on 04/17/2018 CXR ordered shows some pleural fluid in fissures and increased markings. Large heart---awaiting official reading  Started on furosemide 40mg  daily  Reports that after 1 day he started to feel better Was able to lay flat without gasping and coughing  Lab work reviewed with him in detail  total chol 198, ldl 140 CR 1.06, glucose 154   Past Medical History:  Diagnosis Date  . Hypertension  No past surgical history on file.   No outpatient medications have been marked as taking for the 05/03/18 encounter (Appointment) with [name], MD.     Allergies:   Patient has no known allergies.   Social History   Tobacco Use  . Smoking status: Former Smoker    Packs/day: 1.00    Types: Cigarettes  . Smokeless tobacco: Never  Used  Substance Use Topics  . Alcohol use: No  . Drug use: Not on file     Current Outpatient Medications on File Prior to Visit  Medication Sig Dispense Refill  . albuterol (PROVENTIL HFA;VENTOLIN HFA) 108 (90 Base) MCG/ACT inhaler TAKE 2 PUFFS BY MOUTH EVERY 6 HOURS AS NEEDED FOR WHEEZE OR SHORTNESS OF BREATH 6.7 Inhaler 1  . amLODipine (NORVASC) 5 MG tablet Take 1 tablet (5 mg total) by mouth daily. 90 tablet 3  . furosemide (LASIX) 40 MG tablet Take 1 tablet (40 mg total) by mouth daily. 30 tablet 3  . glucose blood (GE100 BLOOD GLUCOSE TEST) test strip Test daily or as directed 100 each 12  . montelukast (SINGULAIR) 10 MG tablet Take 1 tablet (10 mg total) by mouth at bedtime. 30 tablet 3   No current facility-administered medications on file prior to visit.      Family Hx: The patient's family history includes Diabetes in his maternal grandfather and mother; Heart disease in an other family member; Hypertension in his father.  ROS:   Please see the history of present illness.    Review of Systems  Constitutional: Negative.   Respiratory: Positive for shortness of breath.   Cardiovascular: Negative.   Gastrointestinal: Negative.   Musculoskeletal: Negative.   Neurological: Negative.   Psychiatric/Behavioral: Negative.   All other systems reviewed and are negative.     Labs/Other Tests and Data Reviewed:    Recent Labs: 04/17/2018: ALT 30; BUN 11; Creatinine, Ser 1.06; Hemoglobin 15.1; Platelets 182.0; Potassium 4.1; Sodium 140   Recent Lipid Panel Lab Results  Component Value Date/Time   CHOL 198 04/17/2018 12:02 PM   TRIG 156.0 (H) 04/17/2018 12:02 PM   HDL 27.50 (L) 04/17/2018 12:02 PM   CHOLHDL 7 04/17/2018 12:02 PM   LDLCALC 140 (H) 04/17/2018 12:02 PM   LDLDIRECT 162.0 09/06/2016 12:34 PM    Wt Readings from Last 3 Encounters:  04/17/18 232 lb (105.2 kg)  01/26/18 232 lb 8 oz (105.5 kg)  09/15/17 219 lb (99.3 kg)     Exam:    Vital Signs: Vital signs  may also be detailed in the HPI There were no vitals taken for this visit.  Wt Readings from Last 3 Encounters:  04/17/18 232 lb (105.2 kg)  01/26/18 232 lb 8 oz (105.5 kg)  09/15/17 219 lb (99.3 kg)   Temp Readings from Last 3 Encounters:  04/17/18 97.9 F (36.6 C) (Oral)  01/26/18 98.4 F (36.9 C) (Oral)  09/15/17 97.8 F (36.6 C) (Oral)   BP Readings from Last 3 Encounters:  04/17/18 122/86  01/26/18 120/70  09/15/17 116/84   Pulse Readings from Last 3 Encounters:  04/17/18 97  01/26/18 (!) 102  09/15/17 94     Well nourished, well developed male in no acute distress. Constitutional:  oriented to person, place, and time. No distress.    ASSESSMENT & PLAN:    Chronic systolic CHF (congestive heart failure) (HCC) Echocardiogram is very concerning with dilated cardiomyopathy severely reduced ejection fraction and severely elevated right heart pressures He stopped all his  medications on his own, reports that he still feels well Long discussion with him today concerning need to stay on his medications -Discussed causes of his cardiomyopathy including hypertensive heart disease, ischemic, sleep apnea, other etiologies.  He denies alcohol intake. Unable to rule out idiopathic -Recommended he start metoprolol succinate 25 mg daily with losartan 25 mg daily, Lasix 40 daily -Suggest we try to arrange sleep study once viral outbreak has resolved -Discussed cardiac catheterization including left and right heart measurements/imaging He prefers stress testing to be done at a later date He realizes this is less specific but prefers this over invasive study This will be arranged at his convenience  Dilated cardiomyopathy (HCC) - Plan: NM Myocar Multi W/Spect W/Wall Motion / EF As detailed above, ejection fraction 25 to 30% We will need additional work-up to determine etiology Stressed the importance of taking his medications He does not have medication insurance for [employer]. In  the future we might be able to fill out the paperwork for company assistance We will see if he will take his medications as prescribed first  Morbid obesity (HCC) We have encouraged continued exercise, careful diet management in an effort to lose weight.   Diabetes mellitus type 2, uncontrolled, without complications (HCC) Stressed importance that he continue his aggressive diet, weight loss Globin A1c has improved over the past 2 years   COVID-19 Education: The signs and symptoms of COVID-19 were discussed with the patient and how to seek care for testing (follow up with PCP or arrange E-visit).  The importance of social distancing was discussed today.  Patient Risk:   After full review of this patients clinical status, I feel that they are at least moderate risk at this time.  Time:   Today, I have spent 25 minutes with the patient with telehealth technology discussing the cardiac and medical problems/diagnoses detailed above   10 min spent reviewing the chart prior to patient visit today   Medication Adjustments/Labs and Tests Ordered: Current medicines are reviewed at length with the patient today.  Concerns regarding medicines are outlined above.   Tests Ordered: No tests ordered   Medication Changes: No changes made   Disposition: Follow-up in 3 months   Signed, Julien Nordmann, MD  05/03/2018 12:06 PM    [facility] [address], [place], [place] [address]

## 2018-05-04 ENCOUNTER — Telehealth: Payer: Self-pay | Admitting: Cardiovascular Disease

## 2018-05-04 ENCOUNTER — Other Ambulatory Visit: Payer: Self-pay | Admitting: Internal Medicine

## 2018-05-04 ENCOUNTER — Telehealth: Payer: Self-pay | Admitting: *Deleted

## 2018-05-04 NOTE — Telephone Encounter (Signed)
Spoke with the pt. The pt also placed a call to his pcp Dr.Letvak's office. Adv the pt of Dr. Karle Starch response. Pt verbalized understanding and voiced appreciation for the call.

## 2018-05-04 NOTE — Telephone Encounter (Signed)
It will not cause any problems but he is better off with the new medications when he can get them. It is probably a good idea to take the amlodipine daily till he can get the other ones filled

## 2018-05-04 NOTE — Telephone Encounter (Signed)
Left message on VM per DPR. 

## 2018-05-04 NOTE — Telephone Encounter (Signed)
Patient declined to schedule June appy at this time wants to wait and call our office.  Placed recall.  Mailed AVS

## 2018-05-04 NOTE — Telephone Encounter (Signed)
Pt states he took the Amlodipine and aks if that is ok. He states his PCP asked him not to take it. Please call to discuss.

## 2018-05-04 NOTE — Telephone Encounter (Signed)
Patient called back stating that he had talked with you this morning. Patient stated that after he got off the phone with you he realized that he had already taken his Amlodipine this morning. Patient stated that he is nervous about this and wants to make sure that he this will not cause any problems?  Patient requested a call back for reassurance.

## 2018-05-08 ENCOUNTER — Other Ambulatory Visit: Payer: Self-pay

## 2018-05-22 ENCOUNTER — Telehealth: Payer: Self-pay | Admitting: Cardiovascular Disease

## 2018-05-25 ENCOUNTER — Other Ambulatory Visit: Payer: Self-pay | Admitting: Internal Medicine

## 2018-05-29 ENCOUNTER — Ambulatory Visit: Payer: Self-pay

## 2018-06-16 ENCOUNTER — Other Ambulatory Visit: Payer: Self-pay | Admitting: Internal Medicine

## 2018-07-09 NOTE — Telephone Encounter (Signed)
Pt had office visit 01/26/18.

## 2018-09-28 ENCOUNTER — Telehealth: Payer: Self-pay | Admitting: Internal Medicine

## 2018-09-28 MED ORDER — ALBUTEROL SULFATE HFA 108 (90 BASE) MCG/ACT IN AERS: INHALATION_SPRAY | RESPIRATORY_TRACT | 0 refills | Status: DC

## 2018-09-28 NOTE — Telephone Encounter (Signed)
Okay to refill his inhaler also Then check on him on Monday

## 2018-09-28 NOTE — Telephone Encounter (Signed)
Spoke to pt. Pt states he is having popping in his chest, no wheezing. He denies feeling bloated or swollen. I informed him that per Dr Silvio Pate, he needs to start taking his Lasix 40mg  everyday starting today. He kept asking about the inhaler. I advised him that Dr Silvio Pate feels this is CHF related and the inhaler will not work. I advised him I would check in on him Monday. If he is feeling worse over the weekend, I advised him to go to an UC or ER. He was still questioning why no inhaler even when we were hanging up.

## 2018-09-28 NOTE — Telephone Encounter (Signed)
Patient needs a refill Albuterol Inhaler.  Patient said he doesn't have any refills, so he couldn't call the pharmacy.  Patient uses [facility].

## 2018-09-28 NOTE — Telephone Encounter (Signed)
Spoke to pt. Advised him I sent in a rx for the albuterol and that I would check in on him Monday.

## 2018-12-07 ENCOUNTER — Ambulatory Visit: Payer: Self-pay

## 2018-12-07 MED ORDER — ALBUTEROL SULFATE HFA 108 (90 BASE) MCG/ACT IN AERS: INHALATION_SPRAY | RESPIRATORY_TRACT | 0 refills | Status: DC

## 2018-12-07 NOTE — Telephone Encounter (Signed)
Pt. Reports he has "problems with my asthma and bronchitis x 1 week." Short of breath with exertion and at night.No cough or fever. States he is out of his Albuterol inhaler. Dr. Silvio Pate has no availability per [place] Sousa. Offered an appointment with another provider for today. Pt. Refuses.Appointment made for Monday.Pt. is out of his inhaler - requests a refill today. Instructed pt. If symptoms worsen to go to ED.  Reason for Disposition . [1] MODERATE longstanding difficulty breathing (e.g., speaks in phrases, SOB even at rest, pulse 100-120) AND [2] SAME as normal  Answer Assessment - Initial Assessment Questions 1. RESPIRATORY STATUS: "Describe your breathing?" (e.g., wheezing, shortness of breath, unable to speak, severe coughing)      Shortness of breath 2. ONSET: "When did this breathing problem begin?"      Started x 1 week 3. PATTERN "Does the difficult breathing come and go, or has it been constant since it started?"      Comes and goes 4. SEVERITY: "How bad is your breathing?" (e.g., mild, moderate, severe)    - MILD: No SOB at rest, mild SOB with walking, speaks normally in sentences, can lay down, no retractions, pulse < 100.    - MODERATE: SOB at rest, SOB with minimal exertion and prefers to sit, cannot lie down flat, speaks in phrases, mild retractions, audible wheezing, pulse 100-120.    - SEVERE: Very SOB at rest, speaks in single words, struggling to breathe, sitting hunched forward, retractions, pulse > 120      Moderate 5. RECURRENT SYMPTOM: "Have you had difficulty breathing before?" If so, ask: "When was the last time?" and "What happened that time?"      Yes 6. CARDIAC HISTORY: "Do you have any history of heart disease?" (e.g., heart attack, angina, bypass surgery, angioplasty)      Enlarged heart 7. LUNG HISTORY: "Do you have any history of lung disease?"  (e.g., pulmonary embolus, asthma, emphysema)     Asthma 8. CAUSE: "What do you think is causing the breathing  problem?"      Asthma 9. OTHER SYMPTOMS: "Do you have any other symptoms? (e.g., dizziness, runny nose, cough, chest pain, fever)     No 10. PREGNANCY: "Is there any chance you are pregnant?" "When was your last menstrual period?"       n/a 11. TRAVEL: "Have you traveled out of the country in the last month?" (e.g., travel history, exposures)       No  Protocols used: BREATHING DIFFICULTY-A-AH

## 2018-12-07 NOTE — Telephone Encounter (Signed)
Spoke to pt. He has not been on the furosemide. He did take one this morning. Will continue to take it. I sent the refill for the albuterol inhaler. He has an appt with Dr Silvio Pate on 12-10-18.

## 2018-12-07 NOTE — Telephone Encounter (Signed)
Okay to refill the albuterol--but this is usually his fluid issue Make sure he is taking the furosemide every day

## 2018-12-10 ENCOUNTER — Ambulatory Visit (INDEPENDENT_AMBULATORY_CARE_PROVIDER_SITE_OTHER): Payer: Self-pay | Admitting: Internal Medicine

## 2018-12-10 ENCOUNTER — Encounter: Payer: Self-pay | Admitting: Internal Medicine

## 2018-12-10 ENCOUNTER — Other Ambulatory Visit: Payer: Self-pay

## 2018-12-10 DIAGNOSIS — I5022 Chronic systolic (congestive) heart failure: Secondary | ICD-10-CM

## 2018-12-10 DIAGNOSIS — J4520 Mild intermittent asthma, uncomplicated: Secondary | ICD-10-CM

## 2018-12-10 MED ORDER — FUROSEMIDE 40 MG PO TABS: 40.0000 mg | ORAL_TABLET | Freq: Every day | ORAL | 3 refills | Status: DC

## 2018-12-10 MED ORDER — LOSARTAN POTASSIUM 25 MG PO TABS: 25.0000 mg | ORAL_TABLET | Freq: Every day | ORAL | 3 refills | Status: DC

## 2018-12-10 NOTE — Assessment & Plan Note (Addendum)
With recent exacerbation due to not taking furosemide for a while Discussed taking this every day Add over the counter potassium or bananas every day Never filled the beta blocker or ARB---discussed getting insurance on the exchange if possible

## 2018-12-10 NOTE — Progress Notes (Signed)
Subjective:    Patient ID: Joshua Cordova, male    DOB: Mar 05, 1970, 48 y.o.   MRN: [id number]  HPI Here due to increased SOB Started about 2 months ago  This visit occurred during the SARS-CoV-2 public health emergency.  Safety protocols were in place, including screening questions prior to the visit, additional usage of staff PPE, and extensive cleaning of exam room while observing appropriate contact time as indicated for disinfecting solutions.   Noticing at night--like with wheezing Will cough stuff up Had forgotten about the singulair  Never got the losartan Had missed some days with the furosemide Not weighing himself Tries to avoid salt Did see improvement in symptoms after restarting the furosemide  No chest pain No dizziness  Not checking sugars lately Eating less Sugars were down under 115--so he stopped checking  Current Outpatient Medications on File Prior to Visit  Medication Sig Dispense Refill  . albuterol (VENTOLIN HFA) 108 (90 Base) MCG/ACT inhaler TAKE 2 PUFFS BY MOUTH EVERY 6 HOURS AS NEEDED FOR WHEEZE OR SHORTNESS OF BREATH 6.7 g 0  . furosemide (LASIX) 40 MG tablet Take 1 tablet (40 mg total) by mouth daily. 30 tablet 1  . montelukast (SINGULAIR) 10 MG tablet Take 1 tablet (10 mg total) by mouth at bedtime. 30 tablet 3  . glucose blood (GE100 BLOOD GLUCOSE TEST) test strip Test daily or as directed (Patient not taking: Reported on 05/03/2018) 100 each 12  . losartan (COZAAR) 25 MG tablet Take 1 tablet (25 mg total) by mouth daily. (Patient not taking: Reported on 05/03/2018) 30 tablet 1  . metoprolol succinate (TOPROL-XL) 25 MG 24 hr tablet Take 1 tablet (25 mg total) by mouth daily. Take with or immediately following a meal. (Patient not taking: Reported on 05/03/2018) 30 tablet 1   No current facility-administered medications on file prior to visit.     No Known Allergies  Past Medical History:  Diagnosis Date  . Hypertension     History reviewed. No  pertinent surgical history.  Family History  Problem Relation Age of Onset  . Diabetes Mother   . Hypertension Father   . Diabetes Maternal Grandfather   . Heart disease Other     Social History   Socioeconomic History  . Marital status: Single    Spouse name: Not on file  . Number of children: Not on file  . Years of education: Not on file  . Highest education level: Not on file  Occupational History  . Occupation:  Shipping    Comment: [employer]  . Occupation:    Social Needs  . Financial resource strain: Not on file  . Food insecurity    Worry: Not on file    Inability: Not on file  . Transportation needs    Medical: Not on file    Non-medical: Not on file  Tobacco Use  . Smoking status: Former Smoker    Packs/day: 1.00    Types: Cigarettes  . Smokeless tobacco: Never Used  Substance and Sexual Activity  . Alcohol use: No  . Drug use: Not on file  . Sexual activity: Not on file  Lifestyle  . Physical activity    Days per week: Not on file    Minutes per session: Not on file  . Stress: Not on file  Relationships  . Social Musician on phone: Not on file    Gets together: Not on file    Attends religious service: Not on  file    Active member of club or organization: Not on file    Attends meetings of clubs or organizations: Not on file    Relationship status: Not on file  . Intimate partner violence    Fear of current or ex partner: Not on file    Emotionally abused: Not on file    Physically abused: Not on file    Forced sexual activity: Not on file  Other Topics Concern  . Not on file  Social History Narrative  . Not on file   Review of Systems Sleep not great lately Sleeps flat--but then needs to prop up. Some PND Albuterol not helping much    Objective:   Physical Exam  Constitutional: He appears well-developed. No distress.  Neck: No thyromegaly present.  Cardiovascular: Normal rate, regular rhythm, normal heart sounds and  intact distal pulses. Exam reveals no gallop.  No murmur heard. Respiratory: Effort normal and breath sounds normal. No respiratory distress. He has no wheezes. He has no rales.  No dullness  Musculoskeletal:        General: No edema.  Lymphadenopathy:    He has no cervical adenopathy.  Psychiatric: He has a normal mood and affect. His behavior is normal.           Assessment & Plan:

## 2018-12-10 NOTE — Patient Instructions (Signed)
Please check healthcare.gov to see if you can get insurance on the exchange. Continue with daily bananas and you may want to take an over the counter potassium pill as well. Make sure you take the furosemide every day!!!

## 2018-12-10 NOTE — Assessment & Plan Note (Signed)
Doesn't seem to be exacerbated Don't think he needs the montelukast

## 2019-03-12 ENCOUNTER — Other Ambulatory Visit: Payer: Self-pay | Admitting: Internal Medicine

## 2019-03-13 ENCOUNTER — Encounter: Payer: Self-pay | Admitting: Internal Medicine

## 2019-03-13 ENCOUNTER — Ambulatory Visit (INDEPENDENT_AMBULATORY_CARE_PROVIDER_SITE_OTHER): Payer: Self-pay | Admitting: Internal Medicine

## 2019-03-13 ENCOUNTER — Other Ambulatory Visit: Payer: Self-pay

## 2019-03-13 ENCOUNTER — Telehealth: Payer: Self-pay | Admitting: Medical

## 2019-03-13 ENCOUNTER — Encounter: Payer: Self-pay | Admitting: *Deleted

## 2019-03-13 VITALS — BP 120/90 | HR 100 | Ht 71.0 in | Wt 220.5 lb

## 2019-03-13 DIAGNOSIS — I10 Essential (primary) hypertension: Secondary | ICD-10-CM

## 2019-03-13 DIAGNOSIS — I5023 Acute on chronic systolic (congestive) heart failure: Secondary | ICD-10-CM

## 2019-03-13 DIAGNOSIS — Z79899 Other long term (current) drug therapy: Secondary | ICD-10-CM

## 2019-03-13 DIAGNOSIS — I5022 Chronic systolic (congestive) heart failure: Secondary | ICD-10-CM

## 2019-03-13 MED ORDER — METOPROLOL SUCCINATE ER 25 MG PO TB24: 12.5000 mg | ORAL_TABLET | Freq: Every day | ORAL | 1 refills | Status: DC

## 2019-03-13 MED ORDER — FUROSEMIDE 80 MG PO TABS: 80.0000 mg | ORAL_TABLET | Freq: Every day | ORAL | 1 refills | Status: DC

## 2019-03-13 NOTE — Patient Instructions (Signed)
Medication Instructions:  Your physician has recommended you make the following change in your medication:  1- START Metoprolol succinate 12.5 mg (0.5 tablet) by mouth daily. 2- TAKE Furosemide 80 mg by mouth tonight. In the morning, you may decide to take furosemide 80 mg daily or 80 mg two times a day depending on your urine output and weight.  *If you need a refill on your cardiac medications before your next appointment, please call your pharmacy*  Lab Work: Your physician recommends that you return for lab work in: TODAY - CMET, CBC no diff.  If you have labs (blood work) drawn today and your tests are completely normal, you will receive your results only by: Marland Kitchen MyChart Message (if you have MyChart) OR . A paper copy in the mail If you have any lab test that is abnormal or we need to change your treatment, we will call you to review the results.  Testing/Procedures: none  Follow-Up: At [facility], you and your health needs are our priority.  As part of our continuing mission to provide you with exceptional heart care, we have created designated Provider Care Teams.  These Care Teams include your primary Cardiologist (physician) and Advanced Practice Providers (APPs -  Physician Assistants and Nurse Practitioners) who all work together to provide you with the care you need, when you need it.  Your next appointment:   1 week(s) with Dr Mariah Milling or APP  The format for your next appointment:   In Person  Provider:    You may see Dr Mariah Milling or one of the following Advanced Practice Providers on your designated Care Team:    Nicolasa Ducking, NP  Eula Listen, PA-C  Marisue Ivan, [place]

## 2019-03-13 NOTE — Telephone Encounter (Signed)
Patient is scheduled to be seen today @ 3:20pm with Dr. Okey Dupre (DOD). Patient is aware of the appt date and time.

## 2019-03-13 NOTE — Telephone Encounter (Addendum)
   Patient called the after hours line with complaints of progressive SOB. He has a history of chronic combined CHF and is non-compliant with medications due to cost. He has been taking lasix 40mg  daily without relief of symptoms. Also with orthopnea. He denies LE edema or abdominal bloating. No complaints of chest pain, cough, fevers, or diarrhea.   Recommended he take an additional lasix 40mg  this morning. Will route to [facility] triage to assist with an urgent outpatient visit. Hopefully he can be seen today.   We discussed low threshold to present to the ED if symptoms worsen and we are not able to see him in the office today.   Patient was appreciative of the call and in agreement with the plan.   , PA-C 03/13/19

## 2019-03-13 NOTE — Progress Notes (Signed)
Follow-up Outpatient Visit Date: 03/13/2019  Primary Care Provider: Venia Carbon, MD [place] [address]   Primary Cardiologist: Joshua Plants, MD PhD  Chief Complaint: Shortness of breath  HPI:  [name] is a 49 y.o. male with history of chronic systolic heart failure of uncertain etiology, who presents for urgent evaluation of shortness of breath.  He was last seen via virtual visit by Joshua Cordova in 04/2018.  He contacted the on-call APP yesterday, complaining of progressive shortness of breath and orthopnea.  He notes fluctuating dyspnea for years but states that it worsened significantly over the last 3 days.  He now has dyspnea with minimal activity and also has experienced orthopnea and PND.  He has been sleeping at a 45 degree angle the last few days.  His weight is up about 5 pounds from baseline.  He has experienced some intermittent abdominal distention as well as diminished appetite.  Joshua Cordova has a history of noncompliance with medications and follow-up, though he has been using his furosemide fairly regularly recently.  He was advised to take twice his usual dose of furosemide this morning (80 mg) but did not notice much improvement in his urine output.  He denies recent dietary changes, including increased sodium intake.  Joshua Cordova denies chest pain, palpitations, lightheadedness, and edema.  He states that his PCP had previously prescribed metoprolol and advised him to discontinue losartan (though I do not see mention of the latter).  Joshua Cordova had his prescription for metoprolol succinate 25 mg filled today but has yet to start the medication.  --------------------------------------------------------------------------------------------------  Past Medical History:  Diagnosis Date  . Chronic systolic heart failure ([place])   . Hypertension    History reviewed. No pertinent surgical history.   Recent CV Pertinent Labs: Lab Results  Component Value Date    CHOL 198 04/17/2018   HDL 27.50 (L) 04/17/2018   LDLCALC 140 (H) 04/17/2018   LDLDIRECT 162.0 09/06/2016   TRIG 156.0 (H) 04/17/2018   CHOLHDL 7 04/17/2018   K 4.8 03/13/2019   BUN 18 03/13/2019   CREATININE 1.28 (H) 03/13/2019    Past medical and surgical history were reviewed and updated in EPIC.  Current Meds  Medication Sig  . albuterol (VENTOLIN HFA) 108 (90 Base) MCG/ACT inhaler TAKE 2 PUFFS BY MOUTH EVERY 6 HOURS AS NEEDED FOR WHEEZE OR SHORTNESS OF BREATH  . glucose blood (GE100 BLOOD GLUCOSE TEST) test strip Test daily or as directed  . [DISCONTINUED] furosemide (LASIX) 40 MG tablet Take 1 tablet (40 mg total) by mouth daily.    Allergies: Patient has no known allergies.  Social History   Tobacco Use  . Smoking status: Former Smoker    Packs/day: 1.00    Types: Cigarettes  . Smokeless tobacco: Never Used  Substance Use Topics  . Alcohol use: No  . Drug use: Never    Family History  Problem Relation Age of Onset  . Diabetes Mother   . Hypertension Father   . Heart disease Father   . Diabetes Maternal Grandfather   . Heart disease Other    Review of Systems: A 12-system review of systems was performed and was negative except as noted in the HPI.  --------------------------------------------------------------------------------------------------  Physical Exam: BP 120/90 (BP Location: Left Arm, Patient Position: Sitting, Cuff Size: Normal)   Pulse 100   Ht 5\' 11"  (1.803 m)   Wt 220 lb 8 oz (100 kg)   SpO2 95%  BMI 30.75 kg/m   General: NAD. Neck: JVP approximately 8 to 10 cm with positive HJR. Respiratory: Mildly diminished breath sounds throughout without wheezes or crackles. Heart: Regular rate and rhythm with questionable S3.  No murmurs or rubs. Abdomen: Mildly distended without tenderness. Extremities: Trace pretibial edema bilaterally.  EKG: Normal sinus rhythm with poor R wave progression and nonspecific ST/T changes.  Lab Results    Component Value Date   WBC 9.4 03/13/2019   HGB 13.8 03/13/2019   HCT 39.8 03/13/2019   MCV 88 03/13/2019   PLT 229 03/13/2019    Lab Results  Component Value Date   NA 141 03/13/2019   K 4.8 03/13/2019   CL 103 03/13/2019   CO2 22 03/13/2019   BUN 18 03/13/2019   CREATININE 1.28 (H) 03/13/2019   GLUCOSE 87 03/13/2019   ALT 16 03/13/2019    Lab Results  Component Value Date   CHOL 198 04/17/2018   HDL 27.50 (L) 04/17/2018   LDLCALC 140 (H) 04/17/2018   LDLDIRECT 162.0 09/06/2016   TRIG 156.0 (H) 04/17/2018   CHOLHDL 7 04/17/2018    --------------------------------------------------------------------------------------------------  ASSESSMENT AND PLAN: Acute on chronic systolic heart failure: Joshua Cordova reports worsening orthopnea and shortness of breath over the last 3 days.  They only has trace edema, his abdomen is mildly distended and JVP is elevated, suggestive of acute heart failure exacerbation.  I suspect medication and possible dietary noncompliance are playing a role.  I have asked Joshua Cordova to take an additional furosemide 80 mg later today with plans to take 80 mg daily versus twice daily based on his weight and urine output.  I will check a CMP and CBC today.  I think it is reasonable to start metoprolol succinate 25 mg daily.  I will defer restarting an ACE inhibitor or ARB at this time.  Joshua Cordova would benefit from ischemia evaluation at some point once his heart failure is better compensated.  Importance of sodium restriction was stressed as well as regular follow-up.  Hypertension: Diastolic blood pressure mildly elevated today.  As above, we will increase diuresis and have him begin taking metoprolol succinate 25 mg daily.  Follow-up: Return to clinic in 1 week to see Dr. Mariah Cordova or APP.  Joshua Kendall, MD 03/14/2019 3:17 PM

## 2019-03-14 ENCOUNTER — Encounter: Payer: Self-pay | Admitting: Internal Medicine

## 2019-03-14 LAB — COMPREHENSIVE METABOLIC PANEL
ALT: 16 IU/L (ref 0–44)
AST: 27 IU/L (ref 0–40)
Albumin/Globulin Ratio: 1.3 (ref 1.2–2.2)
Albumin: 4.1 g/dL (ref 4.0–5.0)
Alkaline Phosphatase: 92 IU/L (ref 39–117)
BUN/Creatinine Ratio: 14 (ref 9–20)
BUN: 18 mg/dL (ref 6–24)
Bilirubin Total: 1.2 mg/dL (ref 0.0–1.2)
CO2: 22 mmol/L (ref 20–29)
Calcium: 9.2 mg/dL (ref 8.7–10.2)
Chloride: 103 mmol/L (ref 96–106)
Creatinine, Ser: 1.28 mg/dL — ABNORMAL HIGH (ref 0.76–1.27)
GFR calc Af Amer: 77 mL/min/{1.73_m2} (ref >59)
GFR calc non Af Amer: 66 mL/min/{1.73_m2} (ref >59)
Globulin, Total: 3.2 g/dL (ref 1.5–4.5)
Glucose: 87 mg/dL (ref 65–99)
Potassium: 4.8 mmol/L (ref 3.5–5.2)
Sodium: 141 mmol/L (ref 134–144)
Total Protein: 7.3 g/dL (ref 6.0–8.5)

## 2019-03-14 LAB — CBC
Hematocrit: 39.8 % (ref 37.5–51.0)
Hemoglobin: 13.8 g/dL (ref 13.0–17.7)
MCH: 30.5 pg (ref 26.6–33.0)
MCHC: 34.7 g/dL (ref 31.5–35.7)
MCV: 88 fL (ref 79–97)
Platelets: 229 10*3/uL (ref 150–450)
RBC: 4.52 x10E6/uL (ref 4.14–5.80)
RDW: 13.8 % (ref 11.6–15.4)
WBC: 9.4 10*3/uL (ref 3.4–10.8)

## 2019-03-15 ENCOUNTER — Telehealth: Payer: Self-pay | Admitting: Internal Medicine

## 2019-03-15 NOTE — Telephone Encounter (Signed)
I spoke with the patient regarding his results.  The patient voices understanding of his results.  I have encouraged him to maintain adequate hydration. He confirms he took his additional furosemide 80 mg on Wednesday night after his office visit. He also took furosemide 80 mg BID yesterday.  He states his urine output did increase and his swelling went down. He asked if he should continue to take furosemide 80 mg BID until his office visit next week.  I inquired if the patient has a scale at home. He denies this. He states he thinks he can get one. I have discussed the importance of obtaining daily weights. For now, I have advised the patient to assess his symptoms- if his swelling is improved he should only take furosemide 80 mg once dialy, if swelling/ sob increase, he should take furosemide 80 mg BID until he his seen in the office on Friday 3/5 with [name].  The patient voices understanding and is agreeable.

## 2019-03-15 NOTE — Telephone Encounter (Signed)
[name], MD  03/15/2019 7:09 AM EST    Creatinine slightly above baseline but otherwise no significant abnormality. Patient should continue medications as discussed on Wednesday and f/u as previously arranged

## 2019-03-21 NOTE — Progress Notes (Signed)
Date:  03/22/2019   ID:  Joshua Cordova, DOB [birth year], MRN [id number]  Patient Location:  [address] [name] [address]   Provider location:   [facility], [place] office  PCP:  Karie Schwalbe, MD  Cardiologist:  [email or web address]  Chief Complaint  Patient presents with  . office visit    1 week F/U; Meds verbally reviewed with patient.     History of Present Illness:    Joshua Cordova is a 49 y.o. male  past medical history of Diabetes Obesity Former smoker asthma Who presents for f/u of his chronic systolic CHF, EF 32%  Father died 05-23-15 He took care of his father for several years  Significant stress at that time   Currently Delivers parts/engines  Does not like taking medications, takes several supplements  Recently seen last week with worsening shortness of breath Last week 220.8 pounds Now 217.8 one week later after extra Lasix Has been taking Lasix 80 daily  Breathing better compared to last week Does not have to stop when walking  Does not have a scale at home  Prior echocardiogram results discussed, images pulled up  Recent lab work creatinine 1.28 last week  EKG performed April 17, 2018 reviewed personally by myself Showing normal sinus rhythm rate 100 bpm LVH, nonspecific T wave abnormality anterolateral leads     Past Medical History:  Diagnosis Date  . Chronic systolic heart failure (HCC)   . Hypertension    History reviewed. No pertinent surgical history.   Current Meds  Medication Sig  . albuterol (VENTOLIN HFA) 108 (90 Base) MCG/ACT inhaler TAKE 2 PUFFS BY MOUTH EVERY 6 HOURS AS NEEDED FOR WHEEZE OR SHORTNESS OF BREATH  . furosemide (LASIX) 80 MG tablet Take 1 tablet (80 mg total) by mouth daily.  Marland Kitchen glucose blood (GE100 BLOOD GLUCOSE TEST) test strip Test daily or as directed     Allergies:   Patient has no known allergies.   Social History   Tobacco Use  . Smoking status: Former Smoker     Packs/day: 1.00    Types: Cigarettes  . Smokeless tobacco: Never Used  Substance Use Topics  . Alcohol use: No  . Drug use: Never     Current Outpatient Medications on File Prior to Visit  Medication Sig Dispense Refill  . albuterol (VENTOLIN HFA) 108 (90 Base) MCG/ACT inhaler TAKE 2 PUFFS BY MOUTH EVERY 6 HOURS AS NEEDED FOR WHEEZE OR SHORTNESS OF BREATH 18 g 0  . furosemide (LASIX) 80 MG tablet Take 1 tablet (80 mg total) by mouth daily. 30 tablet 1  . glucose blood (GE100 BLOOD GLUCOSE TEST) test strip Test daily or as directed 100 each 12  . metoprolol succinate (TOPROL XL) 25 MG 24 hr tablet Take 0.5 tablets (12.5 mg total) by mouth daily. 45 tablet 1   No current facility-administered medications on file prior to visit.     Family Hx: The patient's family history includes Diabetes in his maternal grandfather and mother; Heart disease in his father and another family member; Hypertension in his father.  ROS:   Please see the history of present illness.    Review of Systems  Constitutional: Negative.   HENT: Negative.   Respiratory: Negative.   Cardiovascular: Negative.   Gastrointestinal: Negative.   Musculoskeletal: Negative.   Neurological: Negative.   Psychiatric/Behavioral: Negative.   All other systems reviewed and are negative.    Labs/Other Tests and Data Reviewed:  Recent Labs: 03/13/2019: ALT 16; BUN 18; Creatinine, Ser 1.28; Hemoglobin 13.8; Platelets 229; Potassium 4.8; Sodium 141   Recent Lipid Panel Lab Results  Component Value Date/Time   CHOL 198 04/17/2018 12:02 PM   TRIG 156.0 (H) 04/17/2018 12:02 PM   HDL 27.50 (L) 04/17/2018 12:02 PM   CHOLHDL 7 04/17/2018 12:02 PM   LDLCALC 140 (H) 04/17/2018 12:02 PM   LDLDIRECT 162.0 09/06/2016 12:34 PM    Wt Readings from Last 3 Encounters:  03/22/19 217 lb 8 oz (98.7 kg)  03/13/19 220 lb 8 oz (100 kg)  12/10/18 217 lb (98.4 kg)     Exam:   BP 120/90 (BP Location: Left Arm, Patient Position:  Sitting, Cuff Size: Large)   Pulse 100   Temp (!) 97.1 F (36.2 C)   Ht 5\' 11"  (1.803 m)   Wt 217 lb 8 oz (98.7 kg)   SpO2 95%   BMI 30.34 kg/m    Constitutional:  oriented to person, place, and time. No distress.  HENT:  Head: Grossly normal Eyes:  no discharge. No scleral icterus.  Neck: No JVD, no carotid bruits  Cardiovascular: Regular rate and rhythm, no murmurs appreciated Pulmonary/Chest: Clear to auscultation bilaterally, no wheezes or rails Abdominal: Soft.  no distension.  no tenderness.  Musculoskeletal: Normal range of motion Neurological:  normal muscle tone. Coordination normal. No atrophy Skin: Skin warm and dry Psychiatric: normal affect, pleasant   ASSESSMENT & PLAN:    Chronic systolic CHF Etiology of his cardiomyopathy unclear Does not like taking medications, prefers to take supplements Again recommended he take metoprolol 25 daily and losartan 25 daily Prescriptions provided, coupon cards for good Rx as he has no insurance -Lasix refilled  Dilated cardiomyopathy Ejection fraction 25% on echocardiogram 2020 Etiology unclear, his father had some cardiac issues We will need ischemic work-up Difficult to order testing given no insurance and large out-of-pocket cost for work-up  Essential hypertension We will start metoprolol succinate 25 daily, losartan 25 daily  Diabetes mellitus type 2, diet-controlled ([place]) We have encouraged continued exercise, careful diet management in an effort to lose weight.   Total encounter time more than 25 minutes  Greater than 50% was spent in counseling and coordination of care with the patient    Signed, Ida Rogue, MD  03/22/2019 8:02 AM    [facility] [id number], [place], [place] [address]

## 2019-03-22 ENCOUNTER — Ambulatory Visit (INDEPENDENT_AMBULATORY_CARE_PROVIDER_SITE_OTHER): Payer: Self-pay | Admitting: Cardiovascular Disease

## 2019-03-22 ENCOUNTER — Other Ambulatory Visit: Payer: Self-pay

## 2019-03-22 ENCOUNTER — Encounter: Payer: Self-pay | Admitting: Cardiovascular Disease

## 2019-03-22 ENCOUNTER — Telehealth: Payer: Self-pay | Admitting: Cardiovascular Disease

## 2019-03-22 VITALS — BP 120/90 | HR 100 | Temp 97.1°F | Ht 71.0 in | Wt 217.5 lb

## 2019-03-22 DIAGNOSIS — I5022 Chronic systolic (congestive) heart failure: Secondary | ICD-10-CM

## 2019-03-22 DIAGNOSIS — E119 Type 2 diabetes mellitus without complications: Secondary | ICD-10-CM

## 2019-03-22 DIAGNOSIS — R0602 Shortness of breath: Secondary | ICD-10-CM

## 2019-03-22 DIAGNOSIS — I420 Dilated cardiomyopathy: Secondary | ICD-10-CM

## 2019-03-22 DIAGNOSIS — I10 Essential (primary) hypertension: Secondary | ICD-10-CM

## 2019-03-22 MED ORDER — FUROSEMIDE 80 MG PO TABS: 80.0000 mg | ORAL_TABLET | Freq: Every day | ORAL | 1 refills | Status: DC

## 2019-03-22 MED ORDER — METOPROLOL SUCCINATE ER 25 MG PO TB24: 25.0000 mg | ORAL_TABLET | Freq: Every day | ORAL | 1 refills | Status: DC

## 2019-03-22 MED ORDER — FUROSEMIDE 80 MG PO TABS: 80.0000 mg | ORAL_TABLET | Freq: Every day | ORAL | 3 refills | Status: DC

## 2019-03-22 MED ORDER — METOPROLOL SUCCINATE ER 25 MG PO TB24: 25.0000 mg | ORAL_TABLET | Freq: Every day | ORAL | 3 refills | Status: DC

## 2019-03-22 MED ORDER — LOSARTAN POTASSIUM 25 MG PO TABS: 25.0000 mg | ORAL_TABLET | Freq: Every day | ORAL | 3 refills | Status: DC

## 2019-03-22 NOTE — Telephone Encounter (Signed)
Patient says pharmacy does not have yet please resend

## 2019-03-22 NOTE — Telephone Encounter (Signed)
Requested Prescriptions   Signed Prescriptions Disp Refills  . furosemide (LASIX) 80 MG tablet 90 tablet 3    Sig: Take 1 tablet (80 mg total) by mouth daily.    Authorizing Provider: [name]    Ordering User: Margrett Rud losartan (COZAAR) 25 MG tablet 90 tablet 3    Sig: Take 1 tablet (25 mg total) by mouth daily.    Authorizing Provider: [name]    Ordering User: Margrett Rud metoprolol succinate (TOPROL XL) 25 MG 24 hr tablet 90 tablet 3    Sig: Take 1 tablet (25 mg total) by mouth daily.    Authorizing Provider: [name]    Ordering User: Margrett Rud

## 2019-03-22 NOTE — Patient Instructions (Addendum)
Medication Instructions:  Your physician has recommended you make the following change in your medication:  1. START Furosemide 40 mg once daily Extra 40 mg after lunch for weight gain, shortness of breath 2. STAY on Metoprolol 25 mg once daily 3. START Losartan 25 mg once daily   If you need a refill on your cardiac medications before your next appointment, please call your pharmacy.    Lab work: No new labs needed   If you have labs (blood work) drawn today and your tests are completely normal, you will receive your results only by: Marland Kitchen MyChart Message (if you have MyChart) OR . A paper copy in the mail If you have any lab test that is abnormal or we need to change your treatment, we will call you to review the results.   Testing/Procedures: No new testing needed   Follow-Up: At [facility], you and your health needs are our priority.  As part of our continuing mission to provide you with exceptional heart care, we have created designated Provider Care Teams.  These Care Teams include your primary Cardiologist (physician) and Advanced Practice Providers (APPs -  Physician Assistants and Nurse Practitioners) who all work together to provide you with the care you need, when you need it.  . You will need a follow up appointment in 3 months  . Providers on your designated Care Team:   . Nicolasa Ducking, NP . Eula Listen, PA-C . Marisue Ivan, PA-C  Any Other Special Instructions Will Be Listed Below (If Applicable).  For educational health videos Log in to : [email or web address] Or : [email or web address], password : [email or web address]

## 2019-03-22 NOTE — Telephone Encounter (Signed)
Requested Prescriptions   Signed Prescriptions Disp Refills  . furosemide (LASIX) 80 MG tablet 90 tablet 3    Sig: Take 1 tablet (80 mg total) by mouth daily.    Authorizing Provider: GOLLAN, TIMOTHY J    Ordering User: Nella Botsford N  . losartan (COZAAR) 25 MG tablet 90 tablet 3    Sig: Take 1 tablet (25 mg total) by mouth daily.    Authorizing Provider: GOLLAN, TIMOTHY J    Ordering User: Kedra Mcglade N  . metoprolol succinate (TOPROL XL) 25 MG 24 hr tablet 90 tablet 3    Sig: Take 1 tablet (25 mg total) by mouth daily.    Authorizing Provider: GOLLAN, TIMOTHY J    Ordering User: Ritta Hammes N    

## 2019-03-25 ENCOUNTER — Telehealth: Payer: Self-pay | Admitting: Cardiovascular Disease

## 2019-03-25 ENCOUNTER — Telehealth: Payer: Self-pay | Admitting: Internal Medicine

## 2019-03-25 NOTE — Telephone Encounter (Signed)
Patient calling in stating he needs a note stating he has been cleared to go back to work. Patient was out all last week "per Doctors orders" and forgot to ask for a return to work note at 3/5 visit.  Patient can pick up letter or it can be faxed to [phone] Attn: B Sink  Please advise as able

## 2019-03-25 NOTE — Telephone Encounter (Signed)
Patient needs a letter stating when he can return to work. Fax for employer [phone]

## 2019-03-25 NOTE — Telephone Encounter (Signed)
Patient is calling back to check the status of his letter

## 2019-03-25 NOTE — Telephone Encounter (Signed)
Spoke with patient and he wanted to know if he could get note to return to work. When he saw Dr. Okey Dupre previously he gave him letter to stay out of work until he followed up with [name] last week. He forgot and went to work today and they would not let him work without note to return. Let me know if this is ok and if there are any limitations.

## 2019-03-26 NOTE — Telephone Encounter (Signed)
Patient calling to check on status of letter  Needs ASAP - please fax to [phone]

## 2019-03-26 NOTE — Telephone Encounter (Signed)
Spoke with patient and he needs letter to return to work. He requested that I please fax this to his job ASAP. Advised that provider is seeing patients today but that I would make him aware of the urgent need for the return letter. He verbalized understanding, agreeable with plan, and had no further questions at this time.

## 2019-03-26 NOTE — Telephone Encounter (Signed)
Duplicate encounter

## 2019-03-27 ENCOUNTER — Encounter: Payer: Self-pay | Admitting: *Deleted

## 2019-03-27 NOTE — Telephone Encounter (Signed)
Letter written and faxed to number listed in previous note. Called and made patient aware that he could return to work. He verbalized understanding with no further questions at this time.

## 2019-05-22 ENCOUNTER — Other Ambulatory Visit: Payer: Self-pay

## 2019-05-22 ENCOUNTER — Encounter: Payer: Self-pay | Admitting: Internal Medicine

## 2019-05-22 ENCOUNTER — Telehealth: Payer: Self-pay

## 2019-05-22 ENCOUNTER — Ambulatory Visit (INDEPENDENT_AMBULATORY_CARE_PROVIDER_SITE_OTHER): Payer: Self-pay | Admitting: Internal Medicine

## 2019-05-22 VITALS — BP 116/78 | HR 86 | Temp 97.1°F | Ht 71.0 in | Wt 216.0 lb

## 2019-05-22 DIAGNOSIS — E119 Type 2 diabetes mellitus without complications: Secondary | ICD-10-CM

## 2019-05-22 DIAGNOSIS — R21 Rash and other nonspecific skin eruption: Secondary | ICD-10-CM

## 2019-05-22 LAB — POCT GLYCOSYLATED HEMOGLOBIN (HGB A1C): Hemoglobin A1C: 7 % — AB (ref 4.0–5.6)

## 2019-05-22 MED ORDER — TRIAMCINOLONE ACETONIDE 0.1 % EX CREA: 1.0000 "application " | TOPICAL_CREAM | Freq: Two times a day (BID) | CUTANEOUS | 1 refills | Status: DC | PRN

## 2019-05-22 NOTE — Telephone Encounter (Signed)
Patient contacted the office and states that Dr. Alphonsus Sias sent in Triamcinolone cream for the rash on his elbow - he states he got home and looked at his medications and he has already been using Triamcinolone cream, which is not helping. He is asking if there is anything different that can be sent in?

## 2019-05-22 NOTE — Assessment & Plan Note (Addendum)
Hasn't been checking Will check A1c  Lab Results  Component Value Date   HGBA1C 7.0 (A) 05/22/2019   Still fine without meds

## 2019-05-22 NOTE — Assessment & Plan Note (Signed)
Discussed that he has shingles that is already resolving on his leg The elbow and back look eczematous---will try TAC (also for head)

## 2019-05-22 NOTE — Progress Notes (Signed)
Subjective:    Patient ID: Joshua Cordova, male    DOB: 1970/07/15, 49 y.o.   MRN: [id number]  HPI Here due to rash This visit occurred during the SARS-CoV-2 public health emergency.  Safety protocols were in place, including screening questions prior to the visit, additional usage of staff PPE, and extensive cleaning of exam room while observing appropriate contact time as indicated for disinfecting solutions.   Started on right elbow--also back of neck  "Out of nowhere" a couple of weeks ago Also on right thigh--that seems like the neck but not the elbow rash OTC cortisone not helping---still itches  No new exposures Current Outpatient Medications on File Prior to Visit  Medication Sig Dispense Refill  . albuterol (VENTOLIN HFA) 108 (90 Base) MCG/ACT inhaler TAKE 2 PUFFS BY MOUTH EVERY 6 HOURS AS NEEDED FOR WHEEZE OR SHORTNESS OF BREATH 18 g 0  . furosemide (LASIX) 80 MG tablet Take 1 tablet (80 mg total) by mouth daily. 90 tablet 3  . glucose blood (GE100 BLOOD GLUCOSE TEST) test strip Test daily or as directed 100 each 12  . losartan (COZAAR) 25 MG tablet Take 1 tablet (25 mg total) by mouth daily. 90 tablet 3  . metoprolol succinate (TOPROL XL) 25 MG 24 hr tablet Take 1 tablet (25 mg total) by mouth daily. 90 tablet 3   No current facility-administered medications on file prior to visit.    No Known Allergies  Past Medical History:  Diagnosis Date  . Chronic systolic heart failure (HCC)   . Hypertension     History reviewed. No pertinent surgical history.  Family History  Problem Relation Age of Onset  . Diabetes Mother   . Hypertension Father   . Heart disease Father   . Diabetes Maternal Grandfather   . Heart disease Other     Social History   Socioeconomic History  . Marital status: Single    Spouse name: Not on file  . Number of children: Not on file  . Years of education: Not on file  . Highest education level: Not on file  Occupational History  .  Occupation: Delivering auto parts    Comment:    . Occupation:    Tobacco Use  . Smoking status: Former Smoker    Packs/day: 1.00    Types: Cigarettes  . Smokeless tobacco: Never Used  Substance and Sexual Activity  . Alcohol use: No  . Drug use: Never  . Sexual activity: Not on file  Other Topics Concern  . Not on file  Social History Narrative  . Not on file   Social Determinants of Health   Financial Resource Strain:   . Difficulty of Paying Living Expenses:   Food Insecurity:   . Worried About Programme researcher, broadcasting/film/video in the Last Year:   . Barista in the Last Year:   Transportation Needs:   . Freight forwarder (Medical):   Marland Kitchen Lack of Transportation (Non-Medical):   Physical Activity:   . Days of Exercise per Week:   . Minutes of Exercise per Session:   Stress:   . Feeling of Stress :   Social Connections:   . Frequency of Communication with Friends and Family:   . Frequency of Social Gatherings with Friends and Family:   . Attends Religious Services:   . Active Member of Clubs or Organizations:   . Attends Banker Meetings:   Marland Kitchen Marital Status:   Intimate Partner Violence:   .  Fear of Current or Ex-Partner:   . Emotionally Abused:   Marland Kitchen Physically Abused:   . Sexually Abused:     Review of Systems Is taking the heart medications and furosemide Breathing is okay Not checking sugars    Objective:   Physical Exam  Constitutional: He appears well-developed. No distress.  Skin:  Crusted clump of lesions on upper thigh and lower calf (L5 distribution) Eczematous rash around right elbow Some lesions on lateral thoracic back and redness in posterior scalp No evidence of infection           Assessment & Plan:

## 2019-05-23 MED ORDER — KETOCONAZOLE 2 % EX CREA: 1.0000 "application " | TOPICAL_CREAM | Freq: Two times a day (BID) | CUTANEOUS | 1 refills | Status: DC | PRN

## 2019-05-23 NOTE — Telephone Encounter (Signed)
Left detailed message on VM per DPR. 

## 2019-05-23 NOTE — Telephone Encounter (Signed)
Please let him know I sent in a different Rx---ketoconazole (an antifungal but also works on other rashes as well). If this doesn't help, he will need to see a dermatologist

## 2019-06-10 ENCOUNTER — Ambulatory Visit: Payer: Self-pay | Admitting: Internal Medicine

## 2019-06-24 ENCOUNTER — Telehealth: Payer: Self-pay | Admitting: Cardiovascular Disease

## 2019-06-24 ENCOUNTER — Ambulatory Visit: Payer: Self-pay | Admitting: Cardiovascular Disease

## 2019-06-24 NOTE — Telephone Encounter (Signed)
Pt c/o medication issue:  1. Name of Medication: cardiac meds   2. How are you currently taking this medication (dosage and times per day)?  3. Are you having a reaction (difficulty breathing--STAT)? No   4. What is your medication issue? Patient wanted to discuss card meds and effect on a1c at visit today . States was given meds to strengthen heart and it is elevating his a1c .  Cancelled patient appt for today . Declined to reschedule sooner with app or dod

## 2019-06-24 NOTE — Telephone Encounter (Signed)
Spoke with the patient. Patient sts that he thinks his heart failure medications are affecting his HgA1c. He has been eating a low sugar diet and working to keep his numbers down. He looked up the side effect of some of his heart medications and they can cause elevated blood sugars. Patient sts that his last HgA1c was up to 7.  He would like to talk with [name] regarding coming off some of his medications is possible. Patient is ok to wait until July 6 and discuss with [name] in person.  Patient voiced appreciation for the cal back.

## 2019-07-22 NOTE — Progress Notes (Signed)
Date:  07/23/2019   ID:  Joshua Cordova, DOB 1970-04-26, MRN [id number]  Patient Location:  [address] [place] [address]   Provider location:   [facility], [place] office  PCP:  Karie Schwalbe, MD  Cardiologist:  [email or web address]  Chief Complaint  Patient presents with  . office visit    3 month F/U; Meds verbally reviewed with patient.    History of Present Illness:    Joshua Cordova is a 49 y.o. male  past medical history of Diabetes Obesity Former smoker Asthma Prescription medication reluctance, no insurance, Dilated cardiomyopathy unclear etiology, ischemic work-up declined secondary to insurance issues Who presents for f/u of his chronic systolic CHF, EF 21% 05/06/2018 Last seen in clinic by myself March 2021  Recent phone call that he was concerned his cardiac medications were causing elevated hemoglobin A1c  Recent A1c 7.0 2016-05-05 A1c 12.24 November 2016 down to 8.8, September 2019 6.9  Medication list includes Lasix 80 daily, losartan 25 mg daily, metoprolol succinate 25 daily  Last BMP 2021-02-19creatinine 1.28  Father died May 06, 2015 He took care of his father for several years  Significant stress at that time   Currently Delivers parts/engines, lots of walking  Does not like taking medications, takes several supplements  Recently seen last week with worsening shortness of breath Last week 220.8 pounds Now 217.8 one week later after extra Lasix Has been taking Lasix 80 daily  Breathing better compared to last week Does not have to stop when walking  Does not have a scale at home Did not take BP meds this AM  Recent lab work creatinine 1.28 last week  EKG performed April 17, 2018 reviewed personally by myself Showing normal sinus rhythm rate 100 bpm LVH, nonspecific T wave abnormality anterolateral leads  EKG personally reviewed by myself on todays visit NSR rate 84 T wave inversions    Past Medical  History:  Diagnosis Date  . Chronic systolic heart failure (HCC)   . Hypertension    History reviewed. No pertinent surgical history.   Current Meds  Medication Sig  . albuterol (VENTOLIN HFA) 108 (90 Base) MCG/ACT inhaler TAKE 2 PUFFS BY MOUTH EVERY 6 HOURS AS NEEDED FOR WHEEZE OR SHORTNESS OF BREATH  . furosemide (LASIX) 80 MG tablet Take 1 tablet (80 mg total) by mouth daily.  Marland Kitchen glucose blood (GE100 BLOOD GLUCOSE TEST) test strip Test daily or as directed  . ketoconazole (NIZORAL) 2 % cream Apply 1 application topically 2 (two) times daily as needed for irritation.  Marland Kitchen losartan (COZAAR) 25 MG tablet Take 1 tablet (25 mg total) by mouth daily.  . metoprolol succinate (TOPROL XL) 25 MG 24 hr tablet Take 1 tablet (25 mg total) by mouth daily.  Marland Kitchen triamcinolone cream (KENALOG) 0.1 % Apply 1 application topically 2 (two) times daily as needed.     Allergies:   Patient has no known allergies.   Social History   Tobacco Use  . Smoking status: Former Smoker    Packs/day: 1.00    Types: Cigarettes  . Smokeless tobacco: Never Used  Vaping Use  . Vaping Use: Never used  Substance Use Topics  . Alcohol use: No  . Drug use: Never      Family Hx: The patient's family history includes Diabetes in his maternal grandfather and mother; Heart disease in his father and another family member; Hypertension in his father.  ROS:   Please  see the history of present illness.    Review of Systems  Constitutional: Negative.   HENT: Negative.   Respiratory: Negative.   Cardiovascular: Negative.   Gastrointestinal: Negative.   Musculoskeletal: Negative.   Neurological: Negative.   Psychiatric/Behavioral: Negative.   All other systems reviewed and are negative.    Labs/Other Tests and Data Reviewed:    Recent Labs: 03/13/2019: ALT 16; BUN 18; Creatinine, Ser 1.28; Hemoglobin 13.8; Platelets 229; Potassium 4.8; Sodium 141   Recent Lipid Panel Lab Results  Component Value Date/Time   CHOL  198 04/17/2018 12:02 PM   TRIG 156.0 (H) 04/17/2018 12:02 PM   HDL 27.50 (L) 04/17/2018 12:02 PM   CHOLHDL 7 04/17/2018 12:02 PM   LDLCALC 140 (H) 04/17/2018 12:02 PM   LDLDIRECT 162.0 09/06/2016 12:34 PM    Wt Readings from Last 3 Encounters:  07/23/19 216 lb 8 oz (98.2 kg)  05/22/19 216 lb (98 kg)  03/22/19 217 lb 8 oz (98.7 kg)     Exam:   BP (!) 134/100 (BP Location: Left Arm, Patient Position: Sitting, Cuff Size: Normal)   Pulse 84   Ht 5\' 11"  (1.803 m)   Wt 216 lb 8 oz (98.2 kg)   SpO2 97%   BMI 30.20 kg/m    Constitutional:  oriented to person, place, and time. No distress.  HENT:  Head: Grossly normal Eyes:  no discharge. No scleral icterus.  Neck: No JVD, no carotid bruits  Cardiovascular: Regular rate and rhythm, no murmurs appreciated Pulmonary/Chest: Clear to auscultation bilaterally, no wheezes or rails Abdominal: Soft.  no distension.  no tenderness.  Musculoskeletal: Normal range of motion Neurological:  normal muscle tone. Coordination normal. No atrophy Skin: Skin warm and dry Psychiatric: normal affect, pleasant   ASSESSMENT & PLAN:    Chronic systolic CHF Long discussion concerning need to stay on his metoprolol and losartan He did not take medications this morning, blood pressure elevated Recommended he monitor blood pressure at work call with some numbers if elevated Continue current medications for now  Dilated cardiomyopathy Ejection fraction 25% on echocardiogram 2020 Etiology unclear, his father had some cardiac issues We will need ischemic work-up He prefers to wait until he has full medical insurance through work they will call 2021 for work-up including echo, possibly stress test if needed  Essential hypertension Continue metoprolol succinate 25 daily, losartan 25 daily  Diabetes mellitus type 2, diet-controlled (HCC) Hemoglobin A1c 7.0, stable, long discussion concerning diet BMP today as he is on Lasix   Total encounter time  more than 25 minutes  Greater than 50% was spent in counseling and coordination of care with the patient    Signed, [place], MD  07/23/2019 8:27 AM    [facility] [address], [place], [place]

## 2019-07-23 ENCOUNTER — Ambulatory Visit (INDEPENDENT_AMBULATORY_CARE_PROVIDER_SITE_OTHER): Payer: Self-pay | Admitting: Cardiovascular Disease

## 2019-07-23 ENCOUNTER — Other Ambulatory Visit: Payer: Self-pay

## 2019-07-23 ENCOUNTER — Encounter: Payer: Self-pay | Admitting: Cardiovascular Disease

## 2019-07-23 VITALS — BP 134/100 | HR 84 | Ht 71.0 in | Wt 216.5 lb

## 2019-07-23 DIAGNOSIS — I10 Essential (primary) hypertension: Secondary | ICD-10-CM

## 2019-07-23 DIAGNOSIS — I420 Dilated cardiomyopathy: Secondary | ICD-10-CM

## 2019-07-23 DIAGNOSIS — E119 Type 2 diabetes mellitus without complications: Secondary | ICD-10-CM

## 2019-07-23 DIAGNOSIS — R0602 Shortness of breath: Secondary | ICD-10-CM

## 2019-07-23 DIAGNOSIS — I5022 Chronic systolic (congestive) heart failure: Secondary | ICD-10-CM

## 2019-07-23 MED ORDER — LOSARTAN POTASSIUM 25 MG PO TABS: 25.0000 mg | ORAL_TABLET | Freq: Every day | ORAL | 3 refills | Status: DC

## 2019-07-23 MED ORDER — METOPROLOL SUCCINATE ER 25 MG PO TB24: 25.0000 mg | ORAL_TABLET | Freq: Every day | ORAL | 3 refills | Status: DC

## 2019-07-23 MED ORDER — FUROSEMIDE 80 MG PO TABS: 80.0000 mg | ORAL_TABLET | Freq: Every day | ORAL | 3 refills | Status: DC

## 2019-07-23 NOTE — Patient Instructions (Addendum)
Medication Instructions:  No changes  Meds 90 days, refills  If you need a refill on your cardiac medications before your next appointment, please call your pharmacy.    Lab work: Your physician recommends that you return for lab work in: TODAY - BMET.   If you have labs (blood work) drawn today and your tests are completely normal, you will receive your results only by: Marland Kitchen MyChart Message (if you have MyChart) OR . A paper copy in the mail If you have any lab test that is abnormal or we need to change your treatment, we will call you to review the results.   Testing/Procedures: No new testing needed   Follow-Up: At [facility], you and your health needs are our priority.  As part of our continuing mission to provide you with exceptional heart care, we have created designated Provider Care Teams.  These Care Teams include your primary Cardiologist (physician) and Advanced Practice Providers (APPs -  Physician Assistants and Nurse Practitioners) who all work together to provide you with the care you need, when you need it.  . You will need a follow up appointment in 12 months   . Providers on your designated Care Team:   . Nicolasa Ducking, NP . Eula Listen, PA-C . Marisue Ivan, PA-C  Any Other Special Instructions Will Be Listed Below (If Applicable).  COVID-19 Vaccine Information can be found at: [email or web address] For questions related to vaccine distribution or appointments, please email [email or web address] or call [phone].

## 2019-07-24 LAB — BASIC METABOLIC PANEL
BUN/Creatinine Ratio: 15 (ref 9–20)
BUN: 18 mg/dL (ref 6–24)
CO2: 19 mmol/L — ABNORMAL LOW (ref 20–29)
Calcium: 9.5 mg/dL (ref 8.7–10.2)
Chloride: 106 mmol/L (ref 96–106)
Creatinine, Ser: 1.22 mg/dL (ref 0.76–1.27)
GFR calc Af Amer: 81 mL/min/{1.73_m2} (ref >59)
GFR calc non Af Amer: 70 mL/min/{1.73_m2} (ref >59)
Glucose: 140 mg/dL — ABNORMAL HIGH (ref 65–99)
Potassium: 4.5 mmol/L (ref 3.5–5.2)
Sodium: 140 mmol/L (ref 134–144)

## 2019-10-30 ENCOUNTER — Telehealth: Payer: Self-pay | Admitting: Cardiovascular Disease

## 2019-10-30 NOTE — Telephone Encounter (Signed)
error 

## 2019-10-30 NOTE — Progress Notes (Signed)
Office Visit    Patient Name: Joshua Cordova Date of Encounter: 10/31/2019  Primary Care Provider:  Karie Schwalbe, MD Primary Cardiologist:  Julien Nordmann, MD  Chief Complaint    Chief Complaint  Patient presents with  . OTHER    Pt not feeling well episodes of rapid heart beats/ unusual sweating at bedtime. Meds reviewed verbally with pt.   49 year old male with history of dilated cardiomyopathy with unclear etiology and ischemic work-up declined 2/2 insurance issues/barriers to care, chronic systolic heart failure with EF 25% 06/01/18), diabetes, previous tobacco use, obesity, asthma, prescription medication reluctance, barriers to care including insurance, and seen today for feeling unwell 1 day ago.   Past Medical History    Past Medical History:  Diagnosis Date  . Chronic systolic heart failure (HCC)   . Hypertension    History reviewed. No pertinent surgical history.  Allergies  No Known Allergies  History of Present Illness    Joshua Cordova is a 49 y.o. male with PMH as above.  His father died in Jun 01, 2015.  He reports that his father had cardiac issues; however, these remain unclear per documentation.  He reportedly took care of his father for several years and reported significant stress at that time.    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  He reportedly does not like taking medications.    He was last seen by his primary cardiologist, [name], 07/23/2019.  It was noted that he had a recent phone call, during which time he was concerned his cardiac medications were causing his elevated hemoglobin A1c.  Recent A1c was 7.0, compared with 31-May-2016 A1c of 12.8.  His medications included Lasix 80 mg daily, losartan 25 mg daily, and metoprolol succinate 25 mg daily.  He had not taken his blood pressure medications that morning and recommendation was that he monitor his blood pressure at work and call if numbers were elevated.   He did not have a scale at home.  His previous reported shortness of breath have resolved with restarting his Lasix, as well as resolution of associated weight gain.  Given unclear etiology of his dilated cardiomyopathy, further ischemic work-up was recommended.  Per patient preference, he wanted to wait until he had full medical insurance through work.  Recommendation was for echo +/- stress testing.   Since his last visit, he has self increased his Lasix 80 mg, due to shortness of breath between visits.  He notes that he usually takes 180 mg tablet in the morning and another 1/2 tablet (40 mg) in the afternoon.  Today, 10/31/2019, he presents to clinic and notes that he recently felt very unwell with an episode of acute chest pressure and shortness of breath.  Specifically, approximately 1 day ago, he was awake from 1 AM to 4 AM with shortness of breath and chest pressure.   He had not taken any of his cardiac medications that day and wondered if that could be the reason for his symptoms. He described the chest pressure as a feeling as if someone had their foot on his chest.  He took his BP and reported it at 120/70. This shortness of breath felt different from that of his previous shortness of breath.  He initially started to cough up some dark yellow phlegm that then resolved.  Associated symptoms included racing heart rate and diaphoresis.  He did not try his inhaler, given that his heart was racing, and  he knew his inhaler might make this worse.  He used 6 pillows that night, as he was unable to lay flat to sleep.  The following day (10/13) he noted that he turned very pale and was significantly short of breath doing every day normal activity, such as walking.  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  Usually, he is able to walk 9 hours without any chest pain or shortness of breath.  However, after the event as described above, he was unable to walk even short distances  without dyspnea.  Today, he states that he was able to walk into the office without dyspnea or chest pain.   He states his only symptom today is pain in his last 3 right fingers, consistent with nerve pain, and with some associated neck pain. He denies any recurrent chest pressure since the episode as described above.  He feels that he has returned to normal color.  No further racing heart rate or diaphoresis.  He was able to sleep on just one pillow last night.  He denied any palpitations.  No presyncope, syncope, or falls.  No recent signs or symptoms of bleeding.  No chest pressure, shortness of breath, cough, diaphoresis, or racing heart rate at the time of our visit.  He reports taking all of his cardiac medications earlier today. Weight today 221 pounds, elevated from previous 07/23/19 wt 216 pounds.  BP today 120/80 (134/100 on 7/6).  Heart rate 96 bpm (84bpm 7/6).  Given his symptoms as described above, long discussion today regarding checking a high-sensitivity troponin and presenting to the emergency department if this was elevated.  Home Medications    Current Outpatient Medications on File Prior to Visit  Medication Sig Dispense Refill  . albuterol (VENTOLIN HFA) 108 (90 Base) MCG/ACT inhaler TAKE 2 PUFFS BY MOUTH EVERY 6 HOURS AS NEEDED FOR WHEEZE OR SHORTNESS OF BREATH 18 g 0  . furosemide (LASIX) 80 MG tablet Take 1 tablet (80 mg total) by mouth daily. 90 tablet 3  . glucose blood (GE100 BLOOD GLUCOSE TEST) test strip Test daily or as directed 100 each 12  . ketoconazole (NIZORAL) 2 % cream Apply 1 application topically 2 (two) times daily as needed for irritation. 60 g 1  . losartan (COZAAR) 25 MG tablet Take 1 tablet (25 mg total) by mouth daily. 90 tablet 3  . metoprolol succinate (TOPROL XL) 25 MG 24 hr tablet Take 1 tablet (25 mg total) by mouth daily. 90 tablet 3  . triamcinolone cream (KENALOG) 0.1 % Apply 1 application topically 2 (two) times daily as needed. 45 g 1   No current  facility-administered medications on file prior to visit.    Review of Systems    He reports an episode of chest pressure, shortness of breath, racing heart rate, diaphoresis, orthopnea, and cough with yellow sputum that occurred acutely from 1 AM to 4 AM on 10/30/2019.  After this episode, he reports being pale and experiencing dyspnea with walking.  This resolved after sleeping that night.  He denies any recurrence since sleeping from 10/13-10/14.  No presyncope or syncope.  No recent falls.  No signs or symptoms of bleeding.  No abdominal distention or early satiety.  No increase in lower extremity edema.  He does note right hand numbness and tingling along 3 fingers, consistent with nerve pain and occasional neck pain.  All other systems reviewed and are otherwise negative except as noted above.  Physical Exam    VS:  BP 120/80 (BP Location: Left Arm, Patient Position: Sitting, Cuff Size: Normal)   Pulse 96   Ht 5\' 11"  (1.803 m)   Wt 221 lb 4 oz (100.4 kg)   SpO2 96%   BMI 30.86 kg/m  , BMI Body mass index is 30.86 kg/m. GEN: Well nourished, well developed, in no acute distress.  Mask in place. HEENT: normal. Neck: Supple, no JVD, carotid bruits, or masses. Cardiac: RRR, no murmurs, rubs, or gallops. No clubbing, cyanosis, edema.  Radials/DP/PT 2+ and equal bilaterally.  Respiratory:  Respirations regular and unlabored, mild reduced breath sounds bilaterally, worse at the bases. GI: Soft, nontender, nondistended, BS + x 4. MS: no deformity or atrophy. Skin: warm and dry, no rash. Neuro:  Strength and sensation are intact. Psych: Normal affect.  Accessory Clinical Findings    ECG personally reviewed by me today -NSR, 96bpm, T wave inversion in inferior leads II, II, aVF not seen on prior tracings, LVH, widening QRS at 118 ms, QTC 490 ms, poor R wave progression in the precordial leads /anterior lateral leads.  Reviewed with DOD.  VITALS Reviewed today   Temp Readings from Last 3  Encounters:  05/22/19 (!) 97.1 F (36.2 C)  03/22/19 (!) 97.1 F (36.2 C)  12/10/18 (!) 96.4 F (35.8 C) (Temporal)   BP Readings from Last 3 Encounters:  10/31/19 120/80  07/23/19 (!) 134/100  05/22/19 116/78   Pulse Readings from Last 3 Encounters:  10/31/19 96  07/23/19 84  05/22/19 86    Wt Readings from Last 3 Encounters:  10/31/19 221 lb 4 oz (100.4 kg)  07/23/19 216 lb 8 oz (98.2 kg)  05/22/19 216 lb (98 kg)     LABS  reviewed today   Lab Results  Component Value Date   WBC 9.4 03/13/2019   HGB 13.8 03/13/2019   HCT 39.8 03/13/2019   MCV 88 03/13/2019   PLT 229 03/13/2019   Lab Results  Component Value Date   CREATININE 1.22 07/23/2019   BUN 18 07/23/2019   NA 140 07/23/2019   K 4.5 07/23/2019   CL 106 07/23/2019   CO2 19 (L) 07/23/2019   Lab Results  Component Value Date   ALT 16 03/13/2019   AST 27 03/13/2019   ALKPHOS 92 03/13/2019   BILITOT 1.2 03/13/2019   Lab Results  Component Value Date   CHOL 198 04/17/2018   HDL 27.50 (L) 04/17/2018   LDLCALC 140 (H) 04/17/2018   LDLDIRECT 162.0 09/06/2016   TRIG 156.0 (H) 04/17/2018   CHOLHDL 7 04/17/2018    Lab Results  Component Value Date   HGBA1C 7.0 (A) 05/22/2019   No results found for: TSH   STUDIES/PROCEDURES reviewed today   Echo 04/2018 1. The left ventricle has severely reduced systolic function, with an  ejection fraction of 25-30%. The cavity size was severely dilated. There  is mildly increased left ventricular wall thickness. Left ventricular  diastolic Doppler parameters are  consistent with restrictive filling due to nondiagnostic images. Elevated  mean left atrial pressure.  2. The right ventricle has normal systolic function. The cavity was  normal. There is no increase in right ventricular wall thickness. Right  ventricular systolic pressure is moderately elevated with an estimated  pressure of 68.3 mmHg.  3. Left atrial size was moderately dilated.  4. The mitral  valve is degenerative. Mild thickening of the mitral valve  leaflet. Mitral valve regurgitation is moderate by color flow  Doppler.  5. The tricuspid valve is not well visualized. Tricuspid valve  regurgitation is mild-moderate.  6. The aortic valve was not well visualized.  7. The aortic root and ascending aorta are normal in size and structure.   Assessment & Plan    Dilated cardiomyopathy of unknown etiology Episode of chest pain 10/30/19 --No current chest pain or SOB during his visit.  Describes an episode from 1 AM to 4 AM with chest pressure, shortness of breath, racing heart rate, diaphoresis, and orthopnea with a day of weakness and dyspnea following this event.  As above, known reduced EF 25 to 30%; however, due to insurance issues, further ischemic work-up has not yet been performed.  Family history includes a father with CAD but no family history of early cardiac death.  Risk factors for cardiac ischemia include male, previous history of smoking, FHx, and elevated LDL/total cholesterol.  Given his elevated high-sensitivity stat troponin today of 36.0, he will be sent to the emergency department for admission with plan for further ischemic work-up after admission and tentatively on 11/01/19 with right and left heart catheterization and as discussed with DOD in clinic.  Continue to cycle high-sensitivity troponin until peaked and downtrending. Obtain serial EKGs with clinic EKG as above showing new T wave inversion in the inferior leads and widening QRS. Update an echo. Most recent labs today showed Hgb 15.0 and hematocrit 42.3.  BMET with Cr 1.29 and BUN 14 (Baseline or previous Cr 1.28-1.22).  Recommend IV heparin, ASA, continued BB, and initiation of statin before discharge as below. NPO after midnight in preparation for [facility] with tentative plan to occur on 10/15. Risks and benefits of cardiac catheterization have been discussed with the patient.  These include bleeding, infection, kidney  damage, stroke, heart attack, death.  The patient understands these risks and is willing to proceed.   Acute on chronic systolic heart failure --No current SOB or dyspnea. Reports acute onset orthopnea and shortness of breath in the setting of chest pressure as above from 1 AM to 4 AM on 10/13 with subsequent full day of dyspnea with minimal exertion.  He has a known reduced ejection fraction of 25 to 30%.  RVSP 68.3 mmHg per echo.  Due to insurance issues, further ischemic work-up has not yet been performed for his HFrEF. ReDS vest today 39%, consistent with volume overload. STAT labs with BNP 289.3.  Clinic weight today elevated from that of previous weight 216lbs  221lbs.  Case discussed with DOD with recommendation for presentation to the ED and admission with likely right and left heart cardiac catheterization 11/01/19. Reviewed plan for [facility] with the patient, and he is willing to proceed.  Recommend he is N.p.o. after midnight with tentative plan for [facility] on 10/15 for further ischemic work-up and better understanding of his hemodynamics to assist with IV diuresis this admission.  Daily BMET.  Trend BNP. Repeat echo as above. Continue BB.  Continue losartan as renal function allows.    Essential hypertension --BP relatively well controlled today at 120/80.  He reports home BP is also relatively well controlled with SBP 120s.  Continue current medications with Toprol 25 mg daily. Continue losartan 25 mg daily as renal function and BP allow.  HLD --04/17/2018 labs show LDL 140 and total cholesterol 198.  Most recent liver function taken today and stable.  Given his elevated LDL, recommend initiation of statin before discharge with follow-up lipid and liver function in 6 to 8 weeks.  DM2 --Most  recent hemoglobin A1c 7.0.  SSI per IM.  Glycemic control recommended for risk factor modification.  BMI 30.86 --Lifestyle changes discussed.  Ongoing diet and exercise.  Disposition: Pending admission and  R/LHC (tentatively scheduled to take place 11/01/2019 as discussed with DOD).    Lennon Alstrom, PA-C 10/31/2019

## 2019-10-31 ENCOUNTER — Encounter: Payer: Self-pay | Admitting: Emergency Medicine

## 2019-10-31 ENCOUNTER — Other Ambulatory Visit: Payer: Self-pay

## 2019-10-31 ENCOUNTER — Emergency Department: Payer: Self-pay

## 2019-10-31 ENCOUNTER — Other Ambulatory Visit
Admission: RE | Admit: 2019-10-31 | Discharge: 2019-10-31 | Disposition: A | Discharge status: Home / Self Care | Payer: Self-pay | Source: Ambulatory Visit | Attending: Physician Assistant | Admitting: Physician Assistant

## 2019-10-31 ENCOUNTER — Telehealth: Payer: Self-pay

## 2019-10-31 ENCOUNTER — Encounter: Payer: Self-pay | Admitting: Physician Assistant

## 2019-10-31 ENCOUNTER — Other Ambulatory Visit: Payer: Self-pay | Admitting: *Deleted

## 2019-10-31 ENCOUNTER — Inpatient Hospital Stay
Admission: EM | Admit: 2019-10-31 | Discharge: 2019-11-02 | DRG: 286 | Disposition: A | Discharge status: Home / Self Care | Payer: Self-pay | Attending: Family Medicine | Admitting: Family Medicine

## 2019-10-31 ENCOUNTER — Ambulatory Visit (INDEPENDENT_AMBULATORY_CARE_PROVIDER_SITE_OTHER): Payer: Self-pay | Admitting: Physician Assistant

## 2019-10-31 VITALS — BP 120/80 | HR 96 | Ht 71.0 in | Wt 221.2 lb

## 2019-10-31 DIAGNOSIS — Z20822 Contact with and (suspected) exposure to covid-19: Secondary | ICD-10-CM | POA: Diagnosis present

## 2019-10-31 DIAGNOSIS — R079 Chest pain, unspecified: Secondary | ICD-10-CM

## 2019-10-31 DIAGNOSIS — I25110 Atherosclerotic heart disease of native coronary artery with unstable angina pectoris: Principal | ICD-10-CM | POA: Diagnosis present

## 2019-10-31 DIAGNOSIS — I5022 Chronic systolic (congestive) heart failure: Secondary | ICD-10-CM | POA: Diagnosis present

## 2019-10-31 DIAGNOSIS — E119 Type 2 diabetes mellitus without complications: Secondary | ICD-10-CM

## 2019-10-31 DIAGNOSIS — J4520 Mild intermittent asthma, uncomplicated: Secondary | ICD-10-CM | POA: Diagnosis present

## 2019-10-31 DIAGNOSIS — I10 Essential (primary) hypertension: Secondary | ICD-10-CM

## 2019-10-31 DIAGNOSIS — Z8249 Family history of ischemic heart disease and other diseases of the circulatory system: Secondary | ICD-10-CM

## 2019-10-31 DIAGNOSIS — I447 Left bundle-branch block, unspecified: Secondary | ICD-10-CM | POA: Diagnosis present

## 2019-10-31 DIAGNOSIS — E669 Obesity, unspecified: Secondary | ICD-10-CM | POA: Diagnosis present

## 2019-10-31 DIAGNOSIS — I5023 Acute on chronic systolic (congestive) heart failure: Secondary | ICD-10-CM | POA: Insufficient documentation

## 2019-10-31 DIAGNOSIS — E1122 Type 2 diabetes mellitus with diabetic chronic kidney disease: Secondary | ICD-10-CM | POA: Diagnosis present

## 2019-10-31 DIAGNOSIS — N182 Chronic kidney disease, stage 2 (mild): Secondary | ICD-10-CM | POA: Diagnosis present

## 2019-10-31 DIAGNOSIS — Z6830 Body mass index (BMI) 30.0-30.9, adult: Secondary | ICD-10-CM

## 2019-10-31 DIAGNOSIS — Z9114 Patient's other noncompliance with medication regimen: Secondary | ICD-10-CM

## 2019-10-31 DIAGNOSIS — J45909 Unspecified asthma, uncomplicated: Secondary | ICD-10-CM | POA: Diagnosis present

## 2019-10-31 DIAGNOSIS — I130 Hypertensive heart and chronic kidney disease with heart failure and stage 1 through stage 4 chronic kidney disease, or unspecified chronic kidney disease: Secondary | ICD-10-CM | POA: Diagnosis present

## 2019-10-31 DIAGNOSIS — I420 Dilated cardiomyopathy: Secondary | ICD-10-CM | POA: Diagnosis present

## 2019-10-31 DIAGNOSIS — R0601 Orthopnea: Secondary | ICD-10-CM

## 2019-10-31 DIAGNOSIS — E785 Hyperlipidemia, unspecified: Secondary | ICD-10-CM | POA: Diagnosis present

## 2019-10-31 DIAGNOSIS — R778 Other specified abnormalities of plasma proteins: Secondary | ICD-10-CM | POA: Diagnosis present

## 2019-10-31 DIAGNOSIS — E1129 Type 2 diabetes mellitus with other diabetic kidney complication: Secondary | ICD-10-CM

## 2019-10-31 DIAGNOSIS — Z87891 Personal history of nicotine dependence: Secondary | ICD-10-CM

## 2019-10-31 DIAGNOSIS — Z833 Family history of diabetes mellitus: Secondary | ICD-10-CM

## 2019-10-31 DIAGNOSIS — Z79899 Other long term (current) drug therapy: Secondary | ICD-10-CM

## 2019-10-31 DIAGNOSIS — I200 Unstable angina: Secondary | ICD-10-CM | POA: Diagnosis present

## 2019-10-31 HISTORY — DX: Other cardiomyopathies: I42.8

## 2019-10-31 LAB — BRAIN NATRIURETIC PEPTIDE: B Natriuretic Peptide: 289.3 pg/mL — ABNORMAL HIGH (ref 0.0–100.0)

## 2019-10-31 LAB — CBC
HCT: 42.3 % (ref 39.0–52.0)
Hemoglobin: 15 g/dL (ref 13.0–17.0)
MCH: 32.5 pg (ref 26.0–34.0)
MCHC: 35.5 g/dL (ref 30.0–36.0)
MCV: 91.8 fL (ref 80.0–100.0)
Platelets: 192 10*3/uL (ref 150–400)
RBC: 4.61 MIL/uL (ref 4.22–5.81)
RDW: 13.7 % (ref 11.5–15.5)
WBC: 8.4 10*3/uL (ref 4.0–10.5)
nRBC: 0 % (ref 0.0–0.2)

## 2019-10-31 LAB — MAGNESIUM: Magnesium: 2 mg/dL (ref 1.7–2.4)

## 2019-10-31 LAB — COMPREHENSIVE METABOLIC PANEL
ALT: 24 U/L (ref 0–44)
AST: 24 U/L (ref 15–41)
Albumin: 4.3 g/dL (ref 3.5–5.0)
Alkaline Phosphatase: 51 U/L (ref 38–126)
Anion gap: 10 (ref 5–15)
BUN: 14 mg/dL (ref 6–20)
CO2: 25 mmol/L (ref 22–32)
Calcium: 9 mg/dL (ref 8.9–10.3)
Chloride: 103 mmol/L (ref 98–111)
Creatinine, Ser: 1.29 mg/dL — ABNORMAL HIGH (ref 0.61–1.24)
GFR, Estimated: >60 mL/min (ref >60)
Glucose, Bld: 125 mg/dL — ABNORMAL HIGH (ref 70–99)
Potassium: 3.7 mmol/L (ref 3.5–5.1)
Sodium: 138 mmol/L (ref 135–145)
Total Bilirubin: 1.6 mg/dL — ABNORMAL HIGH (ref 0.3–1.2)
Total Protein: 7.7 g/dL (ref 6.5–8.1)

## 2019-10-31 LAB — GLUCOSE, CAPILLARY
Glucose-Capillary: 91 mg/dL (ref 70–99)
Glucose-Capillary: 133 mg/dL — ABNORMAL HIGH (ref 70–99)

## 2019-10-31 LAB — TSH: TSH: 2.448 u[IU]/mL (ref 0.350–4.500)

## 2019-10-31 LAB — PROTIME-INR
INR: 1.1 (ref 0.8–1.2)
Prothrombin Time: 13.5 seconds (ref 11.4–15.2)

## 2019-10-31 LAB — TROPONIN I (HIGH SENSITIVITY)
Troponin I (High Sensitivity): 36 ng/L — ABNORMAL HIGH (ref <18)
Troponin I (High Sensitivity): 33 ng/L — ABNORMAL HIGH (ref <18)
Troponin I (High Sensitivity): 33 ng/L — ABNORMAL HIGH (ref <18)
Troponin I (High Sensitivity): 39 ng/L — ABNORMAL HIGH (ref <18)

## 2019-10-31 LAB — RESPIRATORY PANEL BY RT PCR (FLU A&B, COVID)
Influenza A by PCR: NEGATIVE
Influenza B by PCR: NEGATIVE
SARS Coronavirus 2 by RT PCR: NEGATIVE

## 2019-10-31 LAB — APTT: aPTT: 31 seconds (ref 24–36)

## 2019-10-31 MED ORDER — ALPRAZOLAM 0.25 MG PO TABS: 0.2500 mg | ORAL_TABLET | Freq: Two times a day (BID) | ORAL | Status: DC | PRN

## 2019-10-31 MED ORDER — HEPARIN BOLUS VIA INFUSION
4000.0000 [IU] | Freq: Once | INTRAVENOUS | Status: AC
  Administered 2019-10-31: 4000 [IU] via INTRAVENOUS
  Filled 2019-10-31: qty 4000

## 2019-10-31 MED ORDER — ONDANSETRON HCL 4 MG/2ML IJ SOLN: 4.0000 mg | Freq: Four times a day (QID) | INTRAMUSCULAR | Status: DC | PRN

## 2019-10-31 MED ORDER — ASPIRIN 81 MG PO CHEW
324.0000 mg | CHEWABLE_TABLET | Freq: Once | ORAL | Status: AC
  Administered 2019-10-31: 324 mg via ORAL
  Filled 2019-10-31: qty 4

## 2019-10-31 MED ORDER — ACETAMINOPHEN 325 MG PO TABS: 650.0000 mg | ORAL_TABLET | ORAL | Status: DC | PRN

## 2019-10-31 MED ORDER — LOSARTAN POTASSIUM 25 MG PO TABS
25.0000 mg | ORAL_TABLET | Freq: Every day | ORAL | Status: DC
  Administered 2019-11-01 – 2019-11-02 (×2): 25 mg via ORAL
  Filled 2019-10-31 (×3): qty 1

## 2019-10-31 MED ORDER — HEPARIN (PORCINE) 25000 UT/250ML-% IV SOLN
1650.0000 [IU]/h | INTRAVENOUS | Status: DC
  Administered 2019-10-31: 1250 [IU]/h via INTRAVENOUS
  Filled 2019-10-31 (×2): qty 250

## 2019-10-31 MED ORDER — METOPROLOL SUCCINATE ER 25 MG PO TB24
25.0000 mg | ORAL_TABLET | Freq: Every day | ORAL | Status: DC
  Administered 2019-11-01 – 2019-11-02 (×2): 25 mg via ORAL
  Filled 2019-10-31 (×3): qty 1

## 2019-10-31 MED ORDER — ALBUTEROL SULFATE (2.5 MG/3ML) 0.083% IN NEBU: 2.5000 mg | INHALATION_SOLUTION | RESPIRATORY_TRACT | Status: DC | PRN

## 2019-10-31 MED ORDER — INSULIN ASPART 100 UNIT/ML ~~LOC~~ SOLN: 0.0000 [IU] | SUBCUTANEOUS | Status: DC

## 2019-10-31 MED ORDER — FUROSEMIDE 40 MG PO TABS
40.0000 mg | ORAL_TABLET | Freq: Once | ORAL | Status: AC
  Administered 2019-11-01: 40 mg via ORAL
  Filled 2019-10-31: qty 1

## 2019-10-31 MED ORDER — FUROSEMIDE 10 MG/ML IJ SOLN
40.0000 mg | Freq: Two times a day (BID) | INTRAMUSCULAR | Status: AC
  Administered 2019-11-01 – 2019-11-02 (×3): 40 mg via INTRAVENOUS
  Filled 2019-10-31 (×2): qty 4

## 2019-10-31 NOTE — Patient Instructions (Signed)
Medication Instructions:   1. Your physician recommends that you continue on your current medications as directed. Please refer to the Current Medication list given to you today.  *If you need a refill on your cardiac medications before your next appointment, please call your pharmacy*   Lab Work:  1. Your physician recommends that you go to the medical mall for lab work : Troponin , BNP, CBC, CMP  If you have labs (blood work) drawn today and your tests are completely normal, you will receive your results only by:  MyChart Message (if you have MyChart) OR  A paper copy in the mail If you have any lab test that is abnormal or we need to change your treatment, we will call you to review the results.   Testing/Procedures:  1. None Ordered   Follow-Up: At [facility], you and your health needs are our priority.  As part of our continuing mission to provide you with exceptional heart care, we have created designated Provider Care Teams.  These Care Teams include your primary Cardiologist (physician) and Advanced Practice Providers (APPs -  Physician Assistants and Nurse Practitioners) who all work together to provide you with the care you need, when you need it.  We recommend signing up for the patient portal called "MyChart".  Sign up information is provided on this After Visit Summary.  MyChart is used to connect with patients for Virtual Visits (Telemedicine).  Patients are able to view lab/test results, encounter notes, upcoming appointments, etc.  Non-urgent messages can be sent to your provider as well.   To learn more about what you can do with MyChart, go to [email or web address].    Your next appointment:    Follow up will be based off lab results.

## 2019-10-31 NOTE — Telephone Encounter (Signed)
Per Result Note:  "High-sensitivity troponin elevated at 36.0.   Recommend patient present to the emergency department.  Case discussed with DOD and recommendation for admission and right/left cardiac catheterization this admission."

## 2019-10-31 NOTE — Telephone Encounter (Signed)
Per Marisue Ivan, PA  Patients Troponin was Elevated at 36 Jacquelyn instructed me to call the patient and tell them to go to the ED.   I called the patient and made him aware that Per Marisue Ivan, PA he will need to head to the ED.  Patient verbalized and understood he needed to head to the ED.   Called ED charge nurse and made them aware he was in route to the ED.

## 2019-10-31 NOTE — ED Notes (Signed)
Pt provided with sandwich tray and water per request. 

## 2019-10-31 NOTE — H&P (Signed)
Joshua Cordova LDJ:[id number] DOB: 09/13/1970 DOA: 10/31/2019     PCP: Karie Schwalbe, MD   Outpatient Specialists:   CARDS:   Dr.Timothy Mariah Milling, MD    Patient arrived to ER on 10/31/19 at 1333 Referred by Attending Willy Eddy, MD   Patient coming from: home Lives alone,        Chief Complaint:  Chief Complaint  Patient presents with  . Shortness of Breath    HPI: Joshua Cordova is a 49 y.o. male with medical history significant of CHF, morbid obesity, Dm2, asthma, HTN    Presented with   Increased weight and orthopnea was seen by cardiology in clinic today and was sent to Er. Denies any CP but had an episode of chest pressure associate with SOB On that day he had no meds. He felt his heart was racing, cough productive of yellow sputum Had to use 6 pillows last night due to orthopnea.  He had worsening dyspnea with exertion. His symptoms have gotten a bit better in Am and he went to his cardiology office Reports he had his meds today.  Pt with dilated cardiomyopathy EF 255 April 2020 did not have ischemic work up due to lack of insurance  Has not been compliant with BP meds Per cardiology note from 10/13   Recommend IV heparin, ASA, continued BB, and initiation of statin before discharge   NPO after midnight in preparation for [facility] with tentative plan to occur on 10/15.   Reports he does not want to take any medications for DM2   Infectious risk factors:  Reports shortness of breath,  Has  NOt been vaccinated against COVID (refuses)   Initial COVID TEST  NEGATIVE  Lab Results  Component Value Date   SARSCOV2NAA NEGATIVE 10/31/2019    Regarding pertinent Chronic problems:      HTN on cozaar, toprolol   chronic CHF systolic - last echo April 2020 EF 25%     DM 2 -  Lab Results  Component Value Date   HGBA1C 7.0 (A) 05/22/2019  not taking any meds    Asthma -well  controlled on home inhalers              While in ER: CXR showing cardiomegaly ,  no O2 need    Hospitalist was called for admission for unstable angina  The following Work up has been ordered so far:  Orders Placed This Encounter  Procedures  . Respiratory Panel by RT PCR (Flu A&B, Covid) - Nasopharyngeal Swab  . DG Chest Portable 1 View  . APTT  . Protime-INR  . CBC  . heparin per pharmacy consult  . Consult to hospitalist  ALL PATIENTS BEING ADMITTED/HAVING PROCEDURES NEED COVID-19 SCREENING   Following Medications were ordered in ER: Medications  heparin bolus via infusion 4,000 Units (has no administration in time range)  heparin ADULT infusion 100 units/mL (25000 units/280mL sodium chloride 0.45%) (has no administration in time range)  aspirin chewable tablet 324 mg (324 mg Oral Given 10/31/19 1811)        Consult Orders  (From admission, onward)         Start     Ordered   10/31/19 1802  Consult to hospitalist  ALL PATIENTS BEING ADMITTED/HAVING PROCEDURES NEED COVID-19 SCREENING  Once       Comments: ALL PATIENTS BEING ADMITTED/HAVING PROCEDURES NEED COVID-19 SCREENING  Provider:  (Not yet assigned)  Question Answer Comment  Place call to: [phone]   Reason for  Consult Admit      10/31/19 1801          Significant initial  Findings: Abnormal Labs Reviewed  TROPONIN I (HIGH SENSITIVITY) - Abnormal; Notable for the following components:      Result Value   Troponin I (High Sensitivity) 33 (*)    All other components within normal limits  TROPONIN I (HIGH SENSITIVITY) - Abnormal; Notable for the following components:   Troponin I (High Sensitivity) 33 (*)    All other components within normal limits     Otherwise labs showing:    Recent Labs  Lab 10/31/19 1141  NA 138  K 3.7  CO2 25  GLUCOSE 125*  BUN 14  CREATININE 1.29*  CALCIUM 9.0    Cr   stable,   Lab Results  Component Value Date   CREATININE 1.29 (H) 10/31/2019   CREATININE 1.22 07/23/2019   CREATININE 1.28 (H) 03/13/2019    Recent Labs  Lab 10/31/19 1141    AST 24  ALT 24  ALKPHOS 51  BILITOT 1.6*  PROT 7.7  ALBUMIN 4.3   Lab Results  Component Value Date   CALCIUM 9.0 10/31/2019    WBC      Component Value Date/Time   WBC 8.4 10/31/2019 1141   LYMPHSABS 2.6 01/26/2018 1322   MONOABS 0.6 01/26/2018 1322   EOSABS 0.1 01/26/2018 1322   BASOSABS 0.0 01/26/2018 1322   Plt: Lab Results  Component Value Date   PLT 192 10/31/2019      Lab Results  Component Value Date   SARSCOV2NAA NEGATIVE 10/31/2019    HG/HCT  stable,      Component Value Date/Time   HGB 15.0 10/31/2019 1141   HGB 13.8 03/13/2019 1550   HCT 42.3 10/31/2019 1141   HCT 39.8 03/13/2019 1550   MCV 91.8 10/31/2019 1141   MCV 88 03/13/2019 1550    No results for input(s): LIPASE, AMYLASE in the last 168 hours. No results for input(s): AMMONIA in the last 168 hours.     Troponin 33 -33 Cardiac Panel (last 3 results) No results for input(s): CKTOTAL, CKMB, TROPONINI, RELINDX in the last 72 hours.     ECG: Ordered Personally reviewed by me showing: HR : 96 Rhythm:  NSR   nonspecific changes, TW I QTC 490   BNP (last 3 results) Recent Labs    10/31/19 1141  BNP 289.3*   DM  labs:  HbA1C: Recent Labs    05/22/19 1245  HGBA1C 7.0*       CBG (last 3)  No results for input(s): GLUCAP in the last 72 hours.     UA    ordered   Urine analysis: No results found for: COLORURINE, APPEARANCEUR, LABSPEC, PHURINE, GLUCOSEU, HGBUR, BILIRUBINUR, KETONESUR, PROTEINUR, UROBILINOGEN, NITRITE, LEUKOCYTESUR    Ordered   CXR - Cardiomegaly     ED Triage Vitals  Enc Vitals Group     BP 10/31/19 1439 125/85     Pulse Rate 10/31/19 1439 89     Resp 10/31/19 1439 16     Temp 10/31/19 1439 98.4 F (36.9 C)     Temp Source 10/31/19 1439 Oral     SpO2 10/31/19 1439 97 %     Weight 10/31/19 1440 221 lb 3.7 oz (100.4 kg)     Height --      Head Circumference --      Peak Flow --      Pain Score 10/31/19 1439 0  Pain Loc --      Pain Edu? --       Excl. in GC? --   TMAX(24)@       Latest  Blood pressure (!) 153/95, pulse 94, temperature 98.6 F (37 C), temperature source Oral, resp. rate 16, weight 100.4 kg, SpO2 96 %.   Review of Systems:    Pertinent positives include:   dyspnea on exertion, shortness of breath at rest.  non-productive cough Orthopnea, Constitutional:  No weight loss, night sweats, Fevers, chills, fatigue, weight loss  HEENT:  No headaches, Difficulty swallowing,Tooth/dental problems,Sore throat,  No sneezing, itching, ear ache, nasal congestion, post nasal drip,  Cardio-vascular:  No chest pain,  PND, anasarca, dizziness, palpitations.no Bilateral lower extremity swelling  GI:  No heartburn, indigestion, abdominal pain, nausea, vomiting, diarrhea, change in bowel habits, loss of appetite, melena, blood in stool, hematemesis Resp:    No excess mucus, no productive cough, No, No coughing up of blood.No change in color of mucus.No wheezing. Skin:  no rash or lesions. No jaundice GU:  no dysuria, change in color of urine, no urgency or frequency. No straining to urinate.  No flank pain.  Musculoskeletal:  No joint pain or no joint swelling. No decreased range of motion. No back pain.  Psych:  No change in mood or affect. No depression or anxiety. No memory loss.  Neuro: no localizing neurological complaints, no tingling, no weakness, no double vision, no gait abnormality, no slurred speech, no confusion  All systems reviewed and apart from HOPI all are negative  Past Medical History:   Past Medical History:  Diagnosis Date  . CHF (congestive heart failure) (HCC)   . Chronic systolic heart failure (HCC)   . Hypertension       History reviewed. No pertinent surgical history.  Social History:  Ambulatory  independently       reports that he has quit smoking. His smoking use included cigarettes. He smoked 1.00 pack per day. He has never used smokeless tobacco. He reports that he does not  drink alcohol and does not use drugs.  Family History:  Family History  Problem Relation Age of Onset  . Diabetes Mother   . Hypertension Father   . Heart disease Father   . Diabetes Maternal Grandfather   . Heart disease Other     Allergies: No Known Allergies   Prior to Admission medications   Medication Sig Start Date End Date Taking? Authorizing Provider  albuterol (VENTOLIN HFA) 108 (90 Base) MCG/ACT inhaler TAKE 2 PUFFS BY MOUTH EVERY 6 HOURS AS NEEDED FOR WHEEZE OR SHORTNESS OF BREATH 03/13/19   Karie Schwalbe, MD  furosemide (LASIX) 80 MG tablet Take 1 tablet (80 mg total) by mouth daily. 07/23/19   [name], MD  glucose blood (GE100 BLOOD GLUCOSE TEST) test strip Test daily or as directed 09/15/17   Karie Schwalbe, MD  ketoconazole (NIZORAL) 2 % cream Apply 1 application topically 2 (two) times daily as needed for irritation. 05/23/19   Karie Schwalbe, MD  losartan (COZAAR) 25 MG tablet Take 1 tablet (25 mg total) by mouth daily. 07/23/19   [name], MD  metoprolol succinate (TOPROL XL) 25 MG 24 hr tablet Take 1 tablet (25 mg total) by mouth daily. 07/23/19   [name], MD  triamcinolone cream (KENALOG) 0.1 % Apply 1 application topically 2 (two) times daily as needed. 05/22/19   Karie Schwalbe, MD   Physical Exam: Vitals  with BMI 10/31/2019 10/31/2019 10/31/2019  Height - - -  Weight - 221 lbs 4 oz -  BMI - 30.87 -  Systolic 153 - 125  Diastolic 95 - 85  Pulse 94 - 89     1. General:  in No  acute distress   Chronically ill -appearing 2. Psychological: Alert and  Oriented 3. Head/ENT:   Moist Mucous Membranes                          Head Non traumatic, neck supple                            Poor Dentition 4. SKIN: normal  Skin turgor,  Skin clean Dry and intact no rash 5. Heart: Regular rate and rhythm no Murmur, no Rub or gallop 6. Lungs Clear to auscultation bilaterally, no wheezes or crackles   7. Abdomen: Soft,  non-tender, Non  distended  Obese bowel sounds present 8. Lower extremities: no clubbing, cyanosis, no edema 9. Neurologically Grossly intact, moving all 4 extremities equally   10. MSK: Normal range of motion   All other LABS:     Recent Labs  Lab 10/31/19 1141  WBC 8.4  HGB 15.0  HCT 42.3  MCV 91.8  PLT 192     Recent Labs  Lab 10/31/19 1141  NA 138  K 3.7  CL 103  CO2 25  GLUCOSE 125*  BUN 14  CREATININE 1.29*  CALCIUM 9.0     Recent Labs  Lab 10/31/19 1141  AST 24  ALT 24  ALKPHOS 51  BILITOT 1.6*  PROT 7.7  ALBUMIN 4.3       Cultures: No results found for: SDES, SPECREQUEST, CULT, REPTSTATUS   Radiological Exams on Admission: DG Chest Portable 1 View  Result Date: 10/31/2019 CLINICAL DATA:  Shortness of breath. EXAM: PORTABLE CHEST 1 VIEW COMPARISON:  April 17, 2018 FINDINGS: The heart size is enlarged. There is no pneumothorax. No significant pleural effusion. No focal infiltrate. No acute osseous abnormality. IMPRESSION: Cardiomegaly without edema or focal infiltrate. Electronically Signed   By: Katherine Mantle M.D.   On: 10/31/2019 18:54    Chart has been reviewed    Assessment/Plan     49 y.o. male with medical history significant of CHF, morbid obesity, Dm2, asthma, HTN    Admitted for unstable angina  Present on Admission: . Unstable angina (HCC) - - H=  2   ,E= 1  ,A=  1  , R  2   , T   1 ,  for the  Total of 7  therefore will admit for observation and further evaluation ( Risk of MACE: Scores 0-3  of 0.9-1.7%.,  4-6: 12-16.6% , Scores ?7: 50-65% )   - troponin 33 -33 and stable -nonspecific  changes on ECG     - Daily aspirin -  Further risk stratify with lipid panel, hgA1C, obtain TSH.  Make sure patient is on Aspirin.  Cardiology rec heparin drip NPO post midnight and con homemeds Obtain Urine tox CXr Further management depends on pending  workup  . Essential hypertension - continue Toprolol and cozaar   . acute on Chronic systolic  CHF (congestive heart failure) (HCC) - as per cardiolgy note from today  - ReDS vest today 39%, consistent with volume overload. STAT labs with BNP 289.3.  Clinic weight today elevated from that of previous weight  216lbs  221lbs. Plant to repeat echo, cycle CE tentative plan for [facility] on 10/15 for further ischemic work-up and better understanding of his hemodynamics to assist with IV diuresis this admission Continue BB.  Continue losartan as renal function allows.    Continued lasix and change to IV for tomorrow Defer to cardiology regarding further diuresis  . Asthma, allergic, mild intermittent, uncomplicated - chronic stable  . Elevated troponin - contiue to trend currently no CP,   DM2- poorly controlled pt staes he does not want to take any medications for DM. States "iknow what Metformin can do to you" Order SSI  and diabetes coordinator consult   Other plan as per orders.  DVT prophylaxis:      heparin bolus via infusion 4,000 Units Start: 10/31/19 1830    Code Status:    Code Status: Not on file FULL CODE   as per patient  I had personally discussed CODE STATUS with patient     Family Communication:   Family not at  Bedside   Disposition Plan:      To home once workup is complete and patient is stable   Following barriers for discharge:                          Angina stable cardiac work is complete                             Pain controlled with PO medications                                                          Will need consultants to evaluate patient prior to discharge                                          Diabetes care coordinator                                Consults called: cardiology is aware (Plan for cardiac cath in AM)  Admission status:  ED Disposition    ED Disposition Condition Comment   Admit  Hospital Area: [facility] [[id number]]  Level of Care: Progressive Cardiac [[id number]]  Admit to Progressive based on following criteria:  CARDIOVASCULAR & THORACIC of moderate stability with acute coronary syndrome symptoms/low risk myocardial infarction/hypertensive urgency/arrhythmias/heart failure potentially compromising stability and stable post cardiovascular intervention patients.  Covid Evaluation: Symptomatic Person Under Investigation (PUI)  Diagnosis: Unstable angina [facility]) [[id number]]  Admitting Physician: Therisa Doyne [[id number]]  Attending Physician: Therisa Doyne [[id number]]  Estimated length of stay: 3 - 4 days  Certification:: I certify this patient will need inpatient services for at least 2 midnights         inpatient     I Expect 2 midnight stay secondary to severity of patient's current illness need for inpatient interventions justified by the following:   Severe lab/radiological/exam abnormalities including:    elevated troponin  and extensive comorbidities including:   DM2   CHF asthma   Obesity   That are currently affecting medical management.  I expect  patient to be hospitalized for 2 midnights requiring inpatient medical care.  Patient is at high risk for adverse outcome (such as loss of life or disability) if not treated.  Indication for inpatient stay as follows:     persistent chest pain despite medical management Need for operative/procedural  intervention    Need for  IV pain medications, IV anticoagulation    Level of care progressive tele indefinitely please discontinue once patient no longer qualifies COVID-19 Labs    Lab Results  Component Value Date   SARSCOV2NAA NEGATIVE 10/31/2019     Precautions: admitted as Covid Negative      PPE: Used by the provider:   P100  eye Goggles,  Gloves      Ellie Spickler 10/31/2019, 10:18 PM    [facility]     after 2 AM please page floor coverage PA If 7AM-7PM, please contact the day team taking care of the patient using [email or web address]   Patient was evaluated in the context of the global COVID-19 pandemic,  which necessitated consideration that the patient might be at risk for infection with the SARS-CoV-2 virus that causes COVID-19. Institutional protocols and algorithms that pertain to the evaluation of patients at risk for COVID-19 are in a state of rapid change based on information released by regulatory bodies including the CDC and federal and state organizations. These policies and algorithms were followed during the patient's care.

## 2019-10-31 NOTE — ED Triage Notes (Signed)
Says he had bad night 2 nights ago where he couldn't sleep due to short of breath.  He had missed does of meds.  He then took them and was okay last night.  Went to heart care todayand they sent him here for abnormal labs.

## 2019-10-31 NOTE — Progress Notes (Signed)
ANTICOAGULATION CONSULT NOTE Pharmacy Consult for Heparin Infusion Indication: chest pain/ACS  No Known Allergies  Patient Measurements: Weight: 100.4 kg (221 lb 3.7 oz) Heparin Dosing Weight: 96 kg  Vital Signs: Temp: 98.6 F (37 C) (10/14 1742) Temp Source: Oral (10/14 1742) BP: 153/95 (10/14 1742) Pulse Rate: 94 (10/14 1742)  Labs: Recent Labs    10/31/19 1141 10/31/19 1449 10/31/19 1709  HGB 15.0  --   --   HCT 42.3  --   --   PLT 192  --   --   CREATININE 1.29*  --   --   TROPONINIHS 36* 33* 33*    Estimated Creatinine Clearance: 85.4 mL/min (A) (by C-G formula based on SCr of 1.29 mg/dL (H)).   Medical History: Past Medical History:  Diagnosis Date  . CHF (congestive heart failure) (HCC)   . Chronic systolic heart failure (HCC)   . Hypertension     Medications:  Per chart review, patient not on anticoagulation prior to admission  Assessment: 50yo male with PMH for chronic systolic heart failure, HTN, DM, previous tobacco use, obesity, and asthma. He presented to the [facility] clinic with an episode of acute chest pressure and SOB on 10/13 and pt stated he turned very pale and was significantly short of breath during normal activity. At his clinic visit today (10/14) pt denies dyspnea or chest pain. Patient was sent to ER for elevated trop HS 36 for further ischemic work-up and tentatively for right and left heart catheterization on 10/15. Baseline H/H and Plt WNL. Pharmacy has been consulted for heparin dosing and monitoring.   Goal of Therapy:  Heparin level 0.3-0.7 units/ml Monitor platelets by anticoagulation protocol: Yes   Plan:  Give 4000 units bolus x 1 Start heparin infusion at 1250 units/hr Check anti-Xa level in 6 hours and daily while on heparin Continue to monitor H&H and platelets  Ordered baseline aPTT and PT-INR  Raiford Noble, PharmD Pharmacy Resident  10/31/2019 6:27 PM

## 2019-10-31 NOTE — ED Provider Notes (Signed)
[facility] Emergency Department Provider Note    First MD Initiated Contact with Patient 10/31/19 1731     (approximate)  I have reviewed the triage vital signs and the nursing notes.   HISTORY  Chief Complaint Shortness of Breath    HPI Joshua Cordova is a 49 y.o. male   with the below listed past medical history presents to the ER from her [facility] clinic cardiology clinic today.  Reported was having episodes of exertional dyspnea and orthopnea.  Was seen in clinic today found that he had significant weight gain with concern for volume overload given his presentation and history was recommended to come to the ER for admission for cardiac rule out and further evaluation by cardiology.  Patient denies any pain or discomfort at this time.   Past Medical History:  Diagnosis Date  . CHF (congestive heart failure) (HCC)   . Chronic systolic heart failure (HCC)   . Hypertension    Family History  Problem Relation Age of Onset  . Diabetes Mother   . Hypertension Father   . Heart disease Father   . Diabetes Maternal Grandfather   . Heart disease Other    History reviewed. No pertinent surgical history. Patient Active Problem List   Diagnosis Date Noted  . Chronic systolic CHF (congestive heart failure) (HCC) 05/03/2018  . Dilated cardiomyopathy (HCC) 05/03/2018  . Morbid obesity (HCC) 04/20/2018  . Diabetes mellitus type 2, diet-controlled (HCC) 04/20/2018  . DOE (dyspnea on exertion) 04/17/2018  . Congestive heart failure (HCC) 04/17/2018  . Asthma, allergic, mild intermittent, uncomplicated 04/16/2018  . SOB (shortness of breath) 01/26/2018  . Asthmatic bronchitis 09/15/2017  . Rash 09/15/2017  . Diabetes mellitus type 2, uncontrolled, without complications 09/12/2016  . Wart 09/06/2016  . Essential hypertension       Prior to Admission medications   Medication Sig Start Date End Date Taking? Authorizing Provider  albuterol (VENTOLIN HFA) 108  (90 Base) MCG/ACT inhaler TAKE 2 PUFFS BY MOUTH EVERY 6 HOURS AS NEEDED FOR WHEEZE OR SHORTNESS OF BREATH 03/13/19   Karie Schwalbe, MD  furosemide (LASIX) 80 MG tablet Take 1 tablet (80 mg total) by mouth daily. 07/23/19   [name], MD  glucose blood (GE100 BLOOD GLUCOSE TEST) test strip Test daily or as directed 09/15/17   Karie Schwalbe, MD  ketoconazole (NIZORAL) 2 % cream Apply 1 application topically 2 (two) times daily as needed for irritation. 05/23/19   Karie Schwalbe, MD  losartan (COZAAR) 25 MG tablet Take 1 tablet (25 mg total) by mouth daily. 07/23/19   [name], MD  metoprolol succinate (TOPROL XL) 25 MG 24 hr tablet Take 1 tablet (25 mg total) by mouth daily. 07/23/19   [name], MD  triamcinolone cream (KENALOG) 0.1 % Apply 1 application topically 2 (two) times daily as needed. 05/22/19   Karie Schwalbe, MD    Allergies Patient has no known allergies.    Social History Social History   Tobacco Use  . Smoking status: Former Smoker    Packs/day: 1.00    Types: Cigarettes  . Smokeless tobacco: Never Used  Vaping Use  . Vaping Use: Never used  Substance Use Topics  . Alcohol use: No  . Drug use: Never    Review of Systems Patient denies headaches, rhinorrhea, blurry vision, numbness, shortness of breath, chest pain, edema, cough, abdominal pain, nausea, vomiting, diarrhea, dysuria, fevers, rashes or hallucinations unless otherwise stated above  in HPI. ____________________________________________   PHYSICAL EXAM:  VITAL SIGNS: Vitals:   10/31/19 1439 10/31/19 1742  BP: 125/85 (!) 153/95  Pulse: 89 94  Resp: 16 16  Temp: 98.4 F (36.9 C) 98.6 F (37 C)  SpO2: 97% 96%    Constitutional: Alert and oriented.  Eyes: Conjunctivae are normal.  Head: Atraumatic. Nose: No congestion/rhinnorhea. Mouth/Throat: Mucous membranes are moist.   Neck: No stridor. Painless ROM.  Cardiovascular: Normal rate, regular rhythm. Grossly normal heart  sounds.  Good peripheral circulation. Respiratory: Normal respiratory effort.  No retractions. Lungs CTAB. Gastrointestinal: Soft and nontender. No distention. No abdominal bruits. No CVA tenderness. Genitourinary:  Musculoskeletal: No lower extremity tenderness nor edema.  No joint effusions. Neurologic:  Normal speech and language. No gross focal neurologic deficits are appreciated. No facial droop Skin:  Skin is warm, dry and intact. No rash noted. Psychiatric: Mood and affect are normal. Speech and behavior are normal.  ____________________________________________   LABS (all labs ordered are listed, but only abnormal results are displayed)  Results for orders placed or performed during the hospital encounter of 10/31/19 (from the past 24 hour(s))  Troponin I (High Sensitivity)     Status: Abnormal   Collection Time: 10/31/19  2:49 PM  Result Value Ref Range   Troponin I (High Sensitivity) 33 (H) <18 ng/L   ____________________________________________   ____________________________________________  RADIOLOGY  I personally reviewed all radiographic images ordered to evaluate for the above acute complaints and reviewed radiology reports and findings.  These findings were personally discussed with the patient.  Please see medical record for radiology report.  ____________________________________________   PROCEDURES  Procedure(s) performed:  Procedures    Critical Care performed: no ____________________________________________   INITIAL IMPRESSION / ASSESSMENT AND PLAN / ED COURSE  Pertinent labs & imaging results that were available during my care of the patient were reviewed by me and considered in my medical decision making (see chart for details).   DDX: ACS, pericarditis, pe, dissection, pna, bronchitis, costochondritis   Joshua Cordova is a 49 y.o. who presents to the ED with symptoms as described above sent over from cardiology clinic for admission for IV  heparinization aspirin cardiac rule out and plan for probable heart cath in the morning.  Patient is currently asymptomatic.  Does have mild troponin elevation but is stable.  No hypoxia.  Have discussed with the patient and available family all diagnostics and treatments performed thus far and all questions were answered to the best of my ability. The patient demonstrates understanding and agreement with plan.      The patient was evaluated in Emergency Department today for the symptoms described in the history of present illness. He/she was evaluated in the context of the global COVID-19 pandemic, which necessitated consideration that the patient might be at risk for infection with the SARS-CoV-2 virus that causes COVID-19. Institutional protocols and algorithms that pertain to the evaluation of patients at risk for COVID-19 are in a state of rapid change based on information released by regulatory bodies including the CDC and federal and state organizations. These policies and algorithms were followed during the patient's care in the ED.  As part of my medical decision making, I reviewed the following data within the electronic MEDICAL RECORD NUMBER Nursing notes reviewed and incorporated, Labs reviewed, notes from prior ED visits and [place] Controlled Substance Database   ____________________________________________   FINAL CLINICAL IMPRESSION(S) / ED DIAGNOSES  Final diagnoses:  Orthopnea      NEW MEDICATIONS  STARTED DURING THIS VISIT:  New Prescriptions   No medications on file     Note:  This document was prepared using Dragon voice recognition software and may include unintentional dictation errors.    Willy Eddy, MD 10/31/19 Flossie Buffy

## 2019-11-01 ENCOUNTER — Encounter: Payer: Self-pay | Admitting: Certified Registered Nurse Anesthetist

## 2019-11-01 ENCOUNTER — Other Ambulatory Visit: Payer: Self-pay

## 2019-11-01 ENCOUNTER — Encounter
Admission: EM | Disposition: A | Discharge status: Home / Self Care | Payer: Self-pay | Source: Home / Self Care | Attending: Family Medicine

## 2019-11-01 ENCOUNTER — Inpatient Hospital Stay (HOSPITAL_COMMUNITY)
Admit: 2019-11-01 | Discharge: 2019-11-01 | Disposition: A | Discharge status: Home / Self Care | Payer: Self-pay | Attending: Physician Assistant | Admitting: Physician Assistant

## 2019-11-01 ENCOUNTER — Encounter: Payer: Self-pay | Admitting: Internal Medicine

## 2019-11-01 DIAGNOSIS — E118 Type 2 diabetes mellitus with unspecified complications: Secondary | ICD-10-CM

## 2019-11-01 DIAGNOSIS — I10 Essential (primary) hypertension: Secondary | ICD-10-CM

## 2019-11-01 DIAGNOSIS — R0601 Orthopnea: Secondary | ICD-10-CM

## 2019-11-01 DIAGNOSIS — R778 Other specified abnormalities of plasma proteins: Secondary | ICD-10-CM

## 2019-11-01 DIAGNOSIS — E119 Type 2 diabetes mellitus without complications: Secondary | ICD-10-CM

## 2019-11-01 DIAGNOSIS — I5022 Chronic systolic (congestive) heart failure: Secondary | ICD-10-CM

## 2019-11-01 DIAGNOSIS — I5021 Acute systolic (congestive) heart failure: Secondary | ICD-10-CM

## 2019-11-01 DIAGNOSIS — I200 Unstable angina: Secondary | ICD-10-CM

## 2019-11-01 DIAGNOSIS — I25118 Atherosclerotic heart disease of native coronary artery with other forms of angina pectoris: Secondary | ICD-10-CM

## 2019-11-01 DIAGNOSIS — J4520 Mild intermittent asthma, uncomplicated: Secondary | ICD-10-CM

## 2019-11-01 DIAGNOSIS — I5023 Acute on chronic systolic (congestive) heart failure: Secondary | ICD-10-CM

## 2019-11-01 HISTORY — PX: RIGHT/LEFT HEART CATH AND CORONARY ANGIOGRAPHY: CATH118266

## 2019-11-01 LAB — URINALYSIS, COMPLETE (UACMP) WITH MICROSCOPIC
Bacteria, UA: NONE SEEN
Bilirubin Urine: NEGATIVE
Glucose, UA: NEGATIVE mg/dL
Ketones, ur: NEGATIVE mg/dL
Leukocytes,Ua: NEGATIVE
Nitrite: NEGATIVE
Protein, ur: >=300 mg/dL — AB
Specific Gravity, Urine: 1.022 (ref 1.005–1.030)
Squamous Epithelial / HPF: NONE SEEN (ref 0–5)
pH: 5 (ref 5.0–8.0)

## 2019-11-01 LAB — URINE DRUG SCREEN, QUALITATIVE (ARMC ONLY)
Amphetamines, Ur Screen: NOT DETECTED
Barbiturates, Ur Screen: NOT DETECTED
Benzodiazepine, Ur Scrn: NOT DETECTED
Cannabinoid 50 Ng, Ur ~~LOC~~: NOT DETECTED
Cocaine Metabolite,Ur ~~LOC~~: NOT DETECTED
MDMA (Ecstasy)Ur Screen: NOT DETECTED
Methadone Scn, Ur: NOT DETECTED
Opiate, Ur Screen: NOT DETECTED
Phencyclidine (PCP) Ur S: NOT DETECTED
Tricyclic, Ur Screen: NOT DETECTED

## 2019-11-01 LAB — CBC WITH DIFFERENTIAL/PLATELET
Abs Immature Granulocytes: 0.04 10*3/uL (ref 0.00–0.07)
Basophils Absolute: 0.1 10*3/uL (ref 0.0–0.1)
Basophils Relative: 1 %
Eosinophils Absolute: 0.2 10*3/uL (ref 0.0–0.5)
Eosinophils Relative: 2 %
HCT: 39.2 % (ref 39.0–52.0)
Hemoglobin: 13.4 g/dL (ref 13.0–17.0)
Immature Granulocytes: 1 %
Lymphocytes Relative: 40 %
Lymphs Abs: 3.2 10*3/uL (ref 0.7–4.0)
MCH: 31.5 pg (ref 26.0–34.0)
MCHC: 34.2 g/dL (ref 30.0–36.0)
MCV: 92.2 fL (ref 80.0–100.0)
Monocytes Absolute: 0.5 10*3/uL (ref 0.1–1.0)
Monocytes Relative: 6 %
Neutro Abs: 4.1 10*3/uL (ref 1.7–7.7)
Neutrophils Relative %: 50 %
Platelets: 178 10*3/uL (ref 150–400)
RBC: 4.25 MIL/uL (ref 4.22–5.81)
RDW: 13.8 % (ref 11.5–15.5)
WBC: 8 10*3/uL (ref 4.0–10.5)
nRBC: 0 % (ref 0.0–0.2)

## 2019-11-01 LAB — COMPREHENSIVE METABOLIC PANEL
ALT: 22 U/L (ref 0–44)
AST: 21 U/L (ref 15–41)
Albumin: 3.8 g/dL (ref 3.5–5.0)
Alkaline Phosphatase: 45 U/L (ref 38–126)
Anion gap: 11 (ref 5–15)
BUN: 19 mg/dL (ref 6–20)
CO2: 22 mmol/L (ref 22–32)
Calcium: 8.8 mg/dL — ABNORMAL LOW (ref 8.9–10.3)
Chloride: 106 mmol/L (ref 98–111)
Creatinine, Ser: 1.25 mg/dL — ABNORMAL HIGH (ref 0.61–1.24)
GFR, Estimated: >60 mL/min (ref >60)
Glucose, Bld: 117 mg/dL — ABNORMAL HIGH (ref 70–99)
Potassium: 3.5 mmol/L (ref 3.5–5.1)
Sodium: 139 mmol/L (ref 135–145)
Total Bilirubin: 1.6 mg/dL — ABNORMAL HIGH (ref 0.3–1.2)
Total Protein: 7.1 g/dL (ref 6.5–8.1)

## 2019-11-01 LAB — HEPARIN LEVEL (UNFRACTIONATED)
Heparin Unfractionated: 0.13 IU/mL — ABNORMAL LOW (ref 0.30–0.70)
Heparin Unfractionated: 0.24 IU/mL — ABNORMAL LOW (ref 0.30–0.70)

## 2019-11-01 LAB — GLUCOSE, CAPILLARY
Glucose-Capillary: 126 mg/dL — ABNORMAL HIGH (ref 70–99)
Glucose-Capillary: 135 mg/dL — ABNORMAL HIGH (ref 70–99)
Glucose-Capillary: 139 mg/dL — ABNORMAL HIGH (ref 70–99)
Glucose-Capillary: 81 mg/dL (ref 70–99)
Glucose-Capillary: 99 mg/dL (ref 70–99)
Glucose-Capillary: 99 mg/dL (ref 70–99)

## 2019-11-01 LAB — TROPONIN I (HIGH SENSITIVITY): Troponin I (High Sensitivity): 41 ng/L — ABNORMAL HIGH (ref <18)

## 2019-11-01 LAB — HIV ANTIBODY (ROUTINE TESTING W REFLEX): HIV Screen 4th Generation wRfx: NONREACTIVE

## 2019-11-01 SURGERY — RIGHT/LEFT HEART CATH AND CORONARY ANGIOGRAPHY
Anesthesia: Moderate Sedation

## 2019-11-01 MED ORDER — HEPARIN (PORCINE) IN NACL 2000-0.9 UNIT/L-% IV SOLN
INTRAVENOUS | Status: DC | PRN
  Administered 2019-11-01: 1000 mL

## 2019-11-01 MED ORDER — SODIUM CHLORIDE 0.9% FLUSH: 3.0000 mL | INTRAVENOUS | Status: DC | PRN

## 2019-11-01 MED ORDER — IOHEXOL 300 MG/ML  SOLN
INTRAMUSCULAR | Status: DC | PRN
  Administered 2019-11-01: 90 mL

## 2019-11-01 MED ORDER — SODIUM CHLORIDE 0.9 % IV SOLN: INTRAVENOUS | Status: DC

## 2019-11-01 MED ORDER — GUAIFENESIN-DM 100-10 MG/5ML PO SYRP
5.0000 mL | ORAL_SOLUTION | ORAL | Status: DC | PRN
  Administered 2019-11-01: 5 mL via ORAL
  Filled 2019-11-01: qty 5

## 2019-11-01 MED ORDER — LIDOCAINE HCL (PF) 1 % IJ SOLN
INTRAMUSCULAR | Status: AC
  Filled 2019-11-01: qty 30

## 2019-11-01 MED ORDER — HEPARIN (PORCINE) IN NACL 1000-0.9 UT/500ML-% IV SOLN
INTRAVENOUS | Status: AC
  Filled 2019-11-01: qty 1000

## 2019-11-01 MED ORDER — HEPARIN BOLUS VIA INFUSION
2000.0000 [IU] | Freq: Once | INTRAVENOUS | Status: AC
  Administered 2019-11-01: 2000 [IU] via INTRAVENOUS
  Filled 2019-11-01: qty 2000

## 2019-11-01 MED ORDER — FENTANYL CITRATE (PF) 100 MCG/2ML IJ SOLN
INTRAMUSCULAR | Status: DC | PRN
Start: 2019-11-01 — End: 2019-11-01
  Administered 2019-11-01: 50 ug via INTRAVENOUS
  Administered 2019-11-01: 25 ug via INTRAVENOUS

## 2019-11-01 MED ORDER — ASPIRIN 81 MG PO CHEW
CHEWABLE_TABLET | ORAL | Status: AC
  Filled 2019-11-01: qty 1

## 2019-11-01 MED ORDER — HEPARIN BOLUS VIA INFUSION
1000.0000 [IU] | Freq: Once | INTRAVENOUS | Status: DC
  Filled 2019-11-01: qty 1000

## 2019-11-01 MED ORDER — POTASSIUM CHLORIDE CRYS ER 20 MEQ PO TBCR
20.0000 meq | EXTENDED_RELEASE_TABLET | Freq: Two times a day (BID) | ORAL | Status: AC
  Administered 2019-11-01 (×2): 20 meq via ORAL
  Filled 2019-11-01 (×2): qty 1

## 2019-11-01 MED ORDER — MIDAZOLAM HCL 2 MG/2ML IJ SOLN
INTRAMUSCULAR | Status: DC | PRN
  Administered 2019-11-01 (×2): 1 mg via INTRAVENOUS

## 2019-11-01 MED ORDER — MIDAZOLAM HCL 2 MG/2ML IJ SOLN
INTRAMUSCULAR | Status: AC
  Filled 2019-11-01: qty 2

## 2019-11-01 MED ORDER — SODIUM CHLORIDE 0.9 % IV SOLN: 250.0000 mL | INTRAVENOUS | Status: DC | PRN

## 2019-11-01 MED ORDER — ASPIRIN 81 MG PO CHEW
81.0000 mg | CHEWABLE_TABLET | ORAL | Status: AC
  Administered 2019-11-01: 81 mg via ORAL

## 2019-11-01 MED ORDER — PERFLUTREN LIPID MICROSPHERE
1.0000 mL | INTRAVENOUS | Status: AC | PRN
  Administered 2019-11-01: 2 mL via INTRAVENOUS
  Filled 2019-11-01: qty 10

## 2019-11-01 MED ORDER — SODIUM CHLORIDE 0.9% FLUSH
3.0000 mL | Freq: Two times a day (BID) | INTRAVENOUS | Status: DC
  Administered 2019-11-01: 3 mL via INTRAVENOUS

## 2019-11-01 MED ORDER — FENTANYL CITRATE (PF) 100 MCG/2ML IJ SOLN
INTRAMUSCULAR | Status: AC
  Filled 2019-11-01: qty 2

## 2019-11-01 SURGICAL SUPPLY — 13 items
CATH INFINITI 5FR JL4 (CATHETERS) ×2 IMPLANT
CATH INFINITI JR4 5F (CATHETERS) ×3 IMPLANT
CATH SWAN GANZ 7F STRAIGHT (CATHETERS) ×2 IMPLANT
DEVICE CLOSURE MYNXGRIP 5F (Vascular Products) ×3 IMPLANT
GUIDEWIRE EMER 3M J .025X150CM (WIRE) ×3 IMPLANT
KIT HEART LEFT (KITS) ×3 IMPLANT
KIT RIGHT HEART (MISCELLANEOUS) ×3 IMPLANT
NDL PERC 18GX7CM (NEEDLE) IMPLANT
NEEDLE PERC 18GX7CM (NEEDLE) ×3 IMPLANT
PACK CARDIAC CATH (CUSTOM PROCEDURE TRAY) ×3 IMPLANT
SHEATH AVANTI 5FR X 11CM (SHEATH) ×2 IMPLANT
SHEATH AVANTI 7FRX11 (SHEATH) ×2 IMPLANT
WIRE GUIDERIGHT .035X150 (WIRE) ×2 IMPLANT

## 2019-11-01 NOTE — ED Notes (Signed)
Pt states he wants to take dose of lasix now because he laid back and felt like the fluid was building up in his chest.

## 2019-11-01 NOTE — Progress Notes (Signed)
ANTICOAGULATION CONSULT NOTE  Pharmacy Consult for Heparin Infusion Indication: chest pain/ACS  No Known Allergies  Patient Measurements: Weight: 100.4 kg (221 lb 3.7 oz) Heparin Dosing Weight: 96 kg  Vital Signs: Temp: 98.6 F (37 C) (10/14 1742) Temp Source: Oral (10/14 1742) BP: 137/93 (10/15 0110) Pulse Rate: 93 (10/15 0110)  Labs: Recent Labs    10/31/19 1141 10/31/19 1449 10/31/19 1709 10/31/19 1819 10/31/19 2314 11/01/19 0055  HGB 15.0  --   --   --   --   --   HCT 42.3  --   --   --   --   --   PLT 192  --   --   --   --   --   APTT  --   --   --  31  --   --   LABPROT  --   --   --  13.5  --   --   INR  --   --   --  1.1  --   --   HEPARINUNFRC  --   --   --   --   --  0.13*  CREATININE 1.29*  --   --   --   --   --   TROPONINIHS 36*   < > 33*  --  39* 41*   < > = values in this interval not displayed.    Estimated Creatinine Clearance: 85.4 mL/min (A) (by C-G formula based on SCr of 1.29 mg/dL (H)).   Medical History: Past Medical History:  Diagnosis Date  . CHF (congestive heart failure) (HCC)   . Chronic systolic heart failure (HCC)   . Hypertension     Medications:  Per chart review, patient not on anticoagulation prior to admission  Assessment: 49yo male with PMH for chronic systolic heart failure, HTN, DM, previous tobacco use, obesity, and asthma. He presented to the [facility] clinic with an episode of acute chest pressure and SOB on 10/13 and pt stated he turned very pale and was significantly short of breath during normal activity. At his clinic visit today (10/14) pt denies dyspnea or chest pain. Patient was sent to ER for elevated trop HS 36 for further ischemic work-up and tentatively for right and left heart catheterization on 10/15. Baseline H/H and Plt WNL. Pharmacy has been consulted for heparin dosing and monitoring.   Goal of Therapy:  Heparin level 0.3-0.7 units/ml Monitor platelets by anticoagulation protocol: Yes   Plan:  Give  4000 units bolus x 1 Start heparin infusion at 1250 units/hr Check anti-Xa level in 6 hours and daily while on heparin Continue to monitor H&H and platelets  10/15 0055 HL 0.13, SUBtherapeutic.  Will rebolus w/ Heparin 2000 units x 1 and increase infusion to 1450 units/hr, recheck HL in 6 hours  Wayland Denis, PharmD 11/01/2019 1:56 AM

## 2019-11-01 NOTE — Progress Notes (Signed)
Inpatient Diabetes Program Recommendations  AACE/ADA: New Consensus Statement on Inpatient Glycemic Control (2015)  Target Ranges:  Prepandial:   less than 140 mg/dL      Peak postprandial:   less than 180 mg/dL (1-2 hours)      Critically ill patients:  140 - 180 mg/dL   Lab Results  Component Value Date   GLUCAP 126 (H) 11/01/2019   HGBA1C 7.0 (A) 05/22/2019    Review of Glycemic Control Results for Joshua Cordova, Joshua Cordova (MRN [id number]) as of 11/01/2019 12:54  Ref. Range 10/31/2019 19:43 10/31/2019 23:16 11/01/2019 04:59 11/01/2019 08:49  Glucose-Capillary Latest Ref Range: 70 - 99 mg/dL 91 779 (H) 99 390 (H)   Diabetes history: DM 2 Outpatient Diabetes medications: None Current orders for Inpatient glycemic control:  Novolog sensitive q 4 hours  Inpatient Diabetes Program Recommendations:    It appears that patient's blood sugars are controlled at this time.  A1C in May, 2021 was at goal.  Will follow. Does not seem to need oral medications added at this time.   Thanks,  Beryl Meager, RN, BC-ADM Inpatient Diabetes Coordinator Pager [phone] (8a-5p)

## 2019-11-01 NOTE — Consult Note (Addendum)
Cardiology Consultation:   Patient ID: Joshua Cordova MRN: [id number]; DOB: Apr 01, 1970  Admit date: 10/31/2019 Date of Consult: 11/01/2019  Primary Care Provider: Karie Schwalbe, MD Primary Cardiologist: Julien Nordmann, MD  Primary Electrophysiologist:  None    Patient Profile:   Joshua Cordova is a 49 y.o. male with history of dilated cardiomyopathy with unclear etiology and previous ischemic work-up declined 2/2 insurance issues/barriers to care, chronic systolic heart failure with EF 25% 2018/05/12), diabetes, previous tobacco use, obesity, asthma, prescription medication reluctance, barriers to care including insurance, and seen today for chest pain /pressure and SOB with elevated HS Tn in clinic 10/31/19 at the request of Dr. Flossie Dibble.  History of Present Illness:   Joshua Cordova  is a 49 y.o. male with PMH as above.  His father died in 2015/05/12.  He reports that his father had cardiac issues; however, these remain unclear per documentation.  He reportedly took care of his father for several years and reported significant stress at that time.    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  He reportedly does not like taking medications.    He was last seen by his primary cardiologist, [name], 07/23/2019.  It was noted that he had a recent phone call, during which time he was concerned his cardiac medications were causing his elevated hemoglobin A1c.  Recent A1c was 7.0, compared with 2016-05-11 A1c of 12.8.  His medications included Lasix 80 mg daily, losartan 25 mg daily, and metoprolol succinate 25 mg daily.  He had not taken his blood pressure medications that morning and recommendation was that he monitor his blood pressure at work and call if numbers were elevated.  He did not have a scale at home.  His previous reported shortness of breath have resolved with restarting his Lasix, as well as resolution of associated weight gain.  Given unclear etiology  of his dilated cardiomyopathy, further ischemic work-up was recommended.  Per patient preference, he wanted to wait until he had full medical insurance through work.  Recommendation was for echo +/- stress testing.   Since his last visit, he has self increased his Lasix 80 mg, due to shortness of breath between visits.  He notes that he usually takes 180 mg tablet in the morning and another 1/2 tablet (40 mg) in the afternoon.  On 10/31/2019, he presented to clinic and noted an episode of acute chest pressure and shortness of breath from 1 AM to 4 AM on 10/13.   He had not taken any of his cardiac medications that day and wondered if that could be the reason for his symptoms. He felt as if someone had their foot on his chest.  Home BP 120/70. He had never felt SOB and CP like this before in the past.  He initially started to cough up some dark yellow phlegm that then resolved.  Associated symptoms included racing heart rate and diaphoresis.  He did not try his inhaler, given that his heart was racing, and he knew his inhaler might make this worse.  He used 6 pillows that night, as he was unable to lay flat to sleep.  The following day (late AM and PM 10/13) he was pale, weak, and had significant DOE with minimal exertion. 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  Usually, he was able to walk 9 hours without any chest pain or shortness  of breath.  When seen in clinic 10/14, denied any recurrent chest pressure since the episode as described above.  No further racing heart rate, DOE, paleness, or diaphoresis.  His orthopnea had resolved. He had taken all of his cardiac medications that day. Weight 221 pounds, elevated from 07/23/19 wt 216 pounds.  BP 120/80 (134/100 on 7/6).  Heart rate 96 bpm (84bpm 7/6).  Given his symptoms as described above, it was discussed that high-sensitivity troponin would be checked, and if elevated, he would be sent to the  emergency department.  Subsequent high-sensitivity troponin 36 with patient called and instructed to present to the ED.  In the ED, initial vitals significant for HR 89 bpm, RR 16, 125/85, 97% on room air.  Weight 100.4 kg.  Labs showed potassium 3.7, creatinine elevated slightly at 1.29 with BUN 14, bilirubin 1.6, BNP 289.3, high-sensitivity troponin 36  33  33, hemoglobin 15.0, hematocrit 42.3. EKG NSR, 86 bpm, new left bundle branch block with QRS 120 ms, QTC 469, LVH, previous T wave inversion in inferior leads resolved with new T wave inversion in lateral leads.  Checks x-ray shows cardiomegaly without edema or focal infiltrate.  During consultation today, he denies any further chest pressure, orthopnea, acute shortness of breath, or weakness.  He reports that he only slept 2 hours last night.  He received IV Lasix and states that he is not urinating very often.  He urinated once with urine dark per patient and on review of labs small amount RBC detected.  He states that he is still coughing up phlegm; however, this is now light in color.  He states that the BP pressure cuff used overnight was a child small and he is uncertain how accurate the BP readings are, given that they are both with the child's cuff and were taken around his wrist.  He states that he is not yet had his medications this morning.  He denies any further racing heart rate.  No palpitations.  No presyncope or syncope.  No PND.  No abdominal distention or early satiety.  He is eager to proceed with cardiac catheterization.  He reports his main symptom today as fatigue.   Heart Pathway Score:     Past Medical History:  Diagnosis Date  . CHF (congestive heart failure) (HCC)   . Chronic systolic heart failure (HCC)   . Hypertension     History reviewed. No pertinent surgical history.   Home Medications:  Prior to Admission medications   Medication Sig Start Date End Date Taking? Authorizing Provider  albuterol (VENTOLIN HFA)  108 (90 Base) MCG/ACT inhaler TAKE 2 PUFFS BY MOUTH EVERY 6 HOURS AS NEEDED FOR WHEEZE OR SHORTNESS OF BREATH 03/13/19  Yes Karie Schwalbe, MD  furosemide (LASIX) 80 MG tablet Take 1 tablet (80 mg total) by mouth daily. 07/23/19  Yes Gollan, Tollie Pizza, MD  losartan (COZAAR) 25 MG tablet Take 1 tablet (25 mg total) by mouth daily. 07/23/19  Yes [name], MD  metoprolol succinate (TOPROL XL) 25 MG 24 hr tablet Take 1 tablet (25 mg total) by mouth daily. 07/23/19  Yes [name], MD  glucose blood (GE100 BLOOD GLUCOSE TEST) test strip Test daily or as directed 09/15/17   Karie Schwalbe, MD  ketoconazole (NIZORAL) 2 % cream Apply 1 application topically 2 (two) times daily as needed for irritation. Patient not taking: Reported on 10/31/2019 05/23/19   Tillman Abide I, MD  triamcinolone cream (KENALOG) 0.1 % Apply  1 application topically 2 (two) times daily as needed. Patient not taking: Reported on 10/31/2019 05/22/19   Karie Schwalbe, MD    Inpatient Medications: Scheduled Meds: . furosemide  40 mg Intravenous Q12H  . insulin aspart  0-9 Units Subcutaneous Q4H  . losartan  25 mg Oral Daily  . metoprolol succinate  25 mg Oral Daily   Continuous Infusions: . heparin 1,450 Units/hr (11/01/19 0206)   PRN Meds: acetaminophen, albuterol, ALPRAZolam, ondansetron (ZOFRAN) IV  Allergies:   No Known Allergies  Social History:   Social History   Socioeconomic History  . Marital status: Single    Spouse name: Not on file  . Number of children: Not on file  . Years of education: Not on file  . Highest education level: Not on file  Occupational History  . Occupation: Delivering auto parts    Comment: [employer]  . Occupation:    Tobacco Use  . Smoking status: Former Smoker    Packs/day: 1.00    Types: Cigarettes  . Smokeless tobacco: Never Used  Vaping Use  . Vaping Use: Never used  Substance and Sexual Activity  . Alcohol use: No  . Drug use: Never  . Sexual  activity: Not on file  Other Topics Concern  . Not on file  Social History Narrative  . Not on file   Social Determinants of Health   Financial Resource Strain:   . Difficulty of Paying Living Expenses: Not on file  Food Insecurity:   . Worried About Programme researcher, broadcasting/film/video in the Last Year: Not on file  . Ran Out of Food in the Last Year: Not on file  Transportation Needs:   . Lack of Transportation (Medical): Not on file  . Lack of Transportation (Non-Medical): Not on file  Physical Activity:   . Days of Exercise per Week: Not on file  . Minutes of Exercise per Session: Not on file  Stress:   . Feeling of Stress : Not on file  Social Connections:   . Frequency of Communication with Friends and Family: Not on file  . Frequency of Social Gatherings with Friends and Family: Not on file  . Attends Religious Services: Not on file  . Active Member of Clubs or Organizations: Not on file  . Attends Banker Meetings: Not on file  . Marital Status: Not on file  Intimate Partner Violence:   . Fear of Current or Ex-Partner: Not on file  . Emotionally Abused: Not on file  . Physically Abused: Not on file  . Sexually Abused: Not on file    Family History:    Family History  Problem Relation Age of Onset  . Diabetes Mother   . Hypertension Father   . Heart disease Father   . Diabetes Maternal Grandfather   . Heart disease Other      ROS:  Please see the history of present illness.  Review of Systems  Constitutional: Positive for malaise/fatigue.  Respiratory: Positive for cough, sputum production and shortness of breath. Negative for hemoptysis.   Cardiovascular: Positive for chest pain and orthopnea.       No further episodes of chest pain.  Continues to cough with clear sputum to yellow sputum, improved from previous dark sputum.  Denies any further shortness of breath at rest or orthopnea.  Gastrointestinal: Negative for blood in stool and melena.  Genitourinary:  Positive for hematuria.       Reports dark urine  Neurological: Negative  for dizziness, focal weakness and loss of consciousness.  Psychiatric/Behavioral:       Did not sleep well last night, due to being in the hospital and as the bed is uncomfortable.  All other systems reviewed and are negative.   All other ROS reviewed and negative.     Physical Exam/Data:   Vitals:   11/01/19 0145 11/01/19 0215 11/01/19 0245 11/01/19 0500  BP:    (!) 159/112  Pulse: 84 85 79 86  Resp:    14  Temp:      TempSrc:      SpO2: 94% 93% 90% 95%  Weight:       No intake or output data in the 24 hours ending 11/01/19 0715 Last 3 Weights 10/31/2019 10/31/2019 07/23/2019  Weight (lbs) 221 lb 3.7 oz 221 lb 4 oz 216 lb 8 oz  Weight (kg) 100.35 kg 100.358 kg 98.204 kg     Body mass index is 30.86 kg/m.  General:  Well nourished, well developed, in no acute distress HEENT: normal Neck: no JVD Vascular: No carotid bruits; radial pulses 2+ bilaterally Cardiac:  normal S1, S2; RRR; no murmur Lungs:  clear to auscultation bilaterally, no wheezing, rhonchi or rales  Abd: soft, nontender, no hepatomegaly  Ext: no edema Musculoskeletal:  No deformities, BUE and BLE strength normal and equal Skin: warm and dry  Neuro:  No focal abnormalities noted Psych:  Normal affect   EKG:  The EKG was personally reviewed and demonstrates:  EKG NSR, 86 bpm, new left bundle branch block with QRS 120 ms, QTC 469, LVH, previous T wave inversion in inferior leads resolved with new T wave inversion in lateral leads. Telemetry:  Telemetry was personally reviewed and demonstrates: NSR, PVCs, rates 80s to 90s  Relevant CV Studies: Echo 04/2018 1. The left ventricle has severely reduced systolic function, with an  ejection fraction of 25-30%. The cavity size was severely dilated. There  is mildly increased left ventricular wall thickness. Left ventricular  diastolic Doppler parameters are  consistent with restrictive filling  due to nondiagnostic images. Elevated  mean left atrial pressure.  2. The right ventricle has normal systolic function. The cavity was  normal. There is no increase in right ventricular wall thickness. Right  ventricular systolic pressure is moderately elevated with an estimated  pressure of 68.3 mmHg.  3. Left atrial size was moderately dilated.  4. The mitral valve is degenerative. Mild thickening of the mitral valve  leaflet. Mitral valve regurgitation is moderate by color flow Doppler.  5. The tricuspid valve is not well visualized. Tricuspid valve  regurgitation is mild-moderate.  6. The aortic valve was not well visualized.  7. The aortic root and ascending aorta are normal in size and structure.   Laboratory Data:  High Sensitivity Troponin:   Recent Labs  Lab 10/31/19 1141 10/31/19 1449 10/31/19 1709 10/31/19 2314 11/01/19 0055  TROPONINIHS 36* 33* 33* 39* 41*     Cardiac EnzymesNo results for input(s): TROPONINI in the last 168 hours. No results for input(s): TROPIPOC in the last 168 hours.  Chemistry Recent Labs  Lab 10/31/19 1141 11/01/19 0456  NA 138 139  K 3.7 3.5  CL 103 106  CO2 25 22  GLUCOSE 125* 117*  BUN 14 19  CREATININE 1.29* 1.25*  CALCIUM 9.0 8.8*  GFRNONAA >60 >60  ANIONGAP 10 11    Recent Labs  Lab 10/31/19 1141 11/01/19 0456  PROT 7.7 7.1  ALBUMIN 4.3 3.8  AST 24  21  ALT 24 22  ALKPHOS 51 45  BILITOT 1.6* 1.6*   Hematology Recent Labs  Lab 10/31/19 1141 11/01/19 0456  WBC 8.4 8.0  RBC 4.61 4.25  HGB 15.0 13.4  HCT 42.3 39.2  MCV 91.8 92.2  MCH 32.5 31.5  MCHC 35.5 34.2  RDW 13.7 13.8  PLT 192 178   BNP Recent Labs  Lab 10/31/19 1141  BNP 289.3*    DDimer No results for input(s): DDIMER in the last 168 hours.   Radiology/Studies:  DG Chest Portable 1 View  Result Date: 10/31/2019 CLINICAL DATA:  Shortness of breath. EXAM: PORTABLE CHEST 1 VIEW COMPARISON:  April 17, 2018 FINDINGS: The heart size is  enlarged. There is no pneumothorax. No significant pleural effusion. No focal infiltrate. No acute osseous abnormality. IMPRESSION: Cardiomegaly without edema or focal infiltrate. Electronically Signed   By: Katherine Mantle M.D.   On: 10/31/2019 18:54    Assessment and Plan:   Non-STEMI --No current chest pain or SOB.  Describes an episode from 1 AM to 4 AM 10/13 with chest pressure, shortness of breath, racing heart rate, diaphoresis, and orthopnea with a day of weakness and dyspnea following this event.  EF 25 to 30%; however, due to insurance issues, further ischemic work-up never performed.  Family history includes a father with CAD but no family history of early cardiac death.  Risk factors for cardiac ischemia include male, previous history of smoking, FHx, and elevated LDL/total cholesterol.  Given his elevated high-sensitivity stat troponin today of 36.0  31  41, further ischemic work-up planned today with right and left heart catheterization.  Continue to cycle high-sensitivity troponin. Update echo. Daily BMET and CBC.  Recommend IV heparin, ASA, continued BB, and initiation of statin before discharge as below. NPO in preparation for [facility] with tentative plan to occur today 10/15. Risks and benefits of cardiac catheterization have been discussed with the patient.  These include bleeding, infection, kidney damage, stroke, heart attack, death.  The patient understands these risks and is willing to proceed.   Dilated CM of unknown etiology Acute on chronic systolic heart failure --No current SOB or dyspnea. Reports acute onset orthopnea and shortness of breath 1 AM to 4 AM on 10/13.  He has a known reduced ejection fraction of 25 to 30%. RVSP 68.3 mmHg per echo.  ReDS vest 10/14 39%, consistent with volume overload. STAT labs with BNP 289.3.  Clinic weight today elevated from that of previous weight 216lbs  221lbs.   He reports he is not responding well to IV Lasix and has recently had dark  urine.  Continue to monitor I's/O's, daily standing weights.  No documented I/Os yet. Closely monitor daily BMET.  Plan for right and left heart cardiac catheterization 11/01/19 to better understand hemodynamics and assist with IV diuresis. Reviewed plan for [facility] with the patient, and he is willing to proceed. Repeat echo as above. Continue BB.  Continue losartan as renal function allows.    Essential hypertension --BP suboptimal with patient reporting that pressures have been taken with the child's cuff.  Care order sent to nursing staff for clarification/repeat BP with no cough. Continue current medications / PTA Toprol 25 mg daily.   HLD --04/17/2018 labs show LDL 140 and total cholesterol 198.  Most recent liver function tstable.  Given his elevated LDL, recommend initiation of statin before discharge with follow-up lipid and liver function in 6 to 8 weeks.  DM2 --Most recent hemoglobin A1c 7.0.  SSI  per IM.  Glycemic control recommended for risk factor modification.  BMI 30.86 --Lifestyle changes discussed.  Ongoing diet and exercise.     For questions or updates, please contact [facility] Please consult [email or web address] for contact info under     Signed, Lennon Alstrom, PA-C  11/01/2019 7:15 AM

## 2019-11-01 NOTE — Progress Notes (Signed)
*  PRELIMINARY RESULTS* Echocardiogram 2D Echocardiogram has been performed.  Cristela Blue 11/01/2019, 2:42 PM

## 2019-11-01 NOTE — Progress Notes (Addendum)
ANTICOAGULATION CONSULT NOTE  Pharmacy Consult for Heparin Infusion Indication: chest pain/ACS  Patient Measurements: Heparin Dosing Weight: 96 kg  Labs: Recent Labs    10/31/19 1141 10/31/19 1449 10/31/19 1709 10/31/19 1819 10/31/19 2314 11/01/19 0055 11/01/19 0456 11/01/19 0841  HGB 15.0  --   --   --   --   --  13.4  --   HCT 42.3  --   --   --   --   --  39.2  --   PLT 192  --   --   --   --   --  178  --   APTT  --   --   --  31  --   --   --   --   LABPROT  --   --   --  13.5  --   --   --   --   INR  --   --   --  1.1  --   --   --   --   HEPARINUNFRC  --   --   --   --   --  0.13*  --  0.24*  CREATININE 1.29*  --   --   --   --   --  1.25*  --   TROPONINIHS 36*   < > 33*  --  39* 41*  --   --    < > = values in this interval not displayed.    Estimated Creatinine Clearance: 88.1 mL/min (A) (by C-G formula based on SCr of 1.25 mg/dL (H)).   Medical History: Past Medical History:  Diagnosis Date  . CHF (congestive heart failure) (HCC)   . Chronic systolic heart failure (HCC)   . Hypertension     Medications:  Per chart review, patient not on anticoagulation prior to admission  Assessment: 49yo male with PMH for chronic systolic heart failure, HTN, DM, previous tobacco use, obesity, and asthma. He presented to the [facility] clinic with an episode of acute chest pressure and SOB on 10/13 and pt stated he turned very pale and was significantly short of breath during normal activity. At his clinic visit today (10/14) pt denies dyspnea or chest pain. Patient was sent to ER for elevated trop HS 36 for further ischemic work-up and tentatively for right and left heart catheterization on 10/15. Baseline H/H and Plt WNL. Pharmacy has been consulted for heparin dosing and monitoring.   Goal of Therapy:  Heparin level 0.3-0.7 units/ml Monitor platelets by anticoagulation protocol: Yes   Plan:  --10/15 at 0841 HL = 0.24, subtherapeutic. Will order heparin 1000 unit IV  bolus x 1 and increase heparin drip rate to 1650 units/hr --HL 6 hours after rate change --Daily CBC per protocol --Patient is to go for [facility] with cardiology today, continue to follow heparin plan  Tressie Ellis 11/01/2019 9:21 AM

## 2019-11-01 NOTE — Progress Notes (Signed)
PROGRESS NOTE    Patient: Joshua Cordova                            PCP: Karie Schwalbe, MD                    DOB: 02-04-1970            DOA: 10/31/2019 [id number]:[id number]             DOS: 11/01/2019, 8:04 AM   LOS: 1 day   Date of Service: The patient was seen and examined on 11/01/2019  Subjective:   The patient was seen and examined this Am. Stable  Addendum: Status post cardiac catheterization -tolerated procedure Hemodynamically stable  Brief Narrative:   HPI: Joshua Cordova is a 49 y.o. male with medical history significant of CHF, morbid obesity, Dm2, asthma, HTN   Presented with   Increased weight and orthopnea was seen by cardiology in clinic today and was sent to Er. Denies any CP but had an episode of chest pressure associate with SOB On that day he had no meds. He felt his heart was racing, cough productive of yellow sputum Had to use 6 pillows last night due to orthopnea.  He had worsening dyspnea with exertion. His symptoms have gotten a bit better in Am and he went to his cardiology office Reports he had his meds today.  Pt with dilated cardiomyopathy EF 255 April 2020 did not have ischemic work up due to lack of insurance  Has not been compliant with BP meds Per cardiology note from 10/13 Recommend IV heparin, ASA, continued BB, and initiation of statinbefore discharge NPOafter midnight in preparation for [facility] with tentative plan to occuron 10/15.  Reports he does not want to take any medications for DM2    Assessment & Plan:   Active Problems:   Essential hypertension   Asthma, allergic, mild intermittent, uncomplicated   Diabetes mellitus type 2, diet-controlled (HCC)   Chronic systolic CHF (congestive heart failure) (HCC)   Unstable angina (HCC)   Elevated troponin   Acute on chronic systolic CHF (congestive heart failure) (HCC)   Unstable angina  -Denies of having any chest pain at this time (HCC) - - H=  2   ,E= 1  ,A=  1  , R  2   ,  T   1 >> for the  Total of 7) ( Risk of MACE: Scores 0-3  of 0.9-1.7%.,  4-6: 12-16.6% , Scores ?7: 50-65%)   - troponin 33 -33 >>> 41 -nonspecific  changes on ECG    - we will Cont. aspirin -  Lipid panel, LDL:  -  hgA1C,  - TSH : 2.448 Cardiology rec heparin drip NPO post midnight a Obtain Urine tox -clear negative CXr: Cardiomegaly without edema -Cardiology following   Addendum finding; Cardiac catheterization 11/01/2019 Findings: Diffuse moderate coronary artery disease, moderate LAD disease, diagonal branches, RCA, depressed ejection fraction, acute cardiomyopathy, -Cardiology recommended medical management high intensity statins, aspirin, ARB, beta-blocker   Acute on Chronic systolic CHF   -sCHF (congestive heart failure) (HCC) - as per cardiolgy   - ReDSvest today 39%,consistent with volume overload.  -With known previous ejection fraction of 20% in 2020 -elevated BNP 289.3 >>  - Clinic weight today elevated from that of previous weight 216lbs >> 221lbs >>  -Echo:  -Plan forR/LHCon 10/1519 --- finding consistent with reduced LV function, coronary artery disease, -Anticipating initiation  of BB, ARB's, statin -monitor Renal Fx creatinine 1.29, 1.25, Continued lasix  Defer to cardiology regarding further diuresis    Essential hypertension  -Remained stable - continue Toprolol and cozaar     Asthma, allergic, mild intermittent, uncomplicated  - stable  - chronic stable   DM2- poorly controlled pt staes he does not want to take any medications for DM. States "iknow what Metformin can do to you" Order SSI  and diabetes coordinator consult   Other plan as per orders.  DVT prophylaxis:      heparin bolus via infusion 4,000 Units Start: 10/31/19 1830    Code Status:     FULL CODE   as per patient      Family Communication:   Family not at  Bedside   Disposition Plan:      To home once workup is complete and patient is stable   Following barriers  for discharge:                          Angina stable cardiac work is complete                             Pain controlled with PO medications                                                                                      Consults called: cardiology is aware (Plan for cardiac cath in AM)      ------------------------------------------------------------------------------------------------------------------------------------------------  Admission status:    Status is: Inpatient  Remains inpatient appropriate because:Inpatient level of care appropriate due to severity of illness   Dispo: The patient is from: Home              Anticipated d/c is to: Home              Anticipated d/c date is: 2 days              Patient currently is not medically stable to d/c.        Procedures:   No admission procedures for hospital encounter.     Antimicrobials:  Anti-infectives (From admission, onward)   None       Medication:   furosemide  40 mg Intravenous Q12H   insulin aspart  0-9 Units Subcutaneous Q4H   losartan  25 mg Oral Daily   metoprolol succinate  25 mg Oral Daily    acetaminophen, albuterol, ALPRAZolam, ondansetron (ZOFRAN) IV   Objective:   Vitals:   11/01/19 0145 11/01/19 0215 11/01/19 0245 11/01/19 0500  BP:    (!) 159/112  Pulse: 84 85 79 86  Resp:    14  Temp:      TempSrc:      SpO2: 94% 93% 90% 95%  Weight:       No intake or output data in the 24 hours ending 11/01/19 0804 Filed Weights   10/31/19 1440  Weight: 100.4 kg     Examination:   Physical Exam  Constitution:  Alert, cooperative, no distress,  Appears calm and comfortable  Psychiatric:  Normal and stable mood and affect, cognition intact,   HEENT: Normocephalic, PERRL, otherwise with in Normal limits  Chest:Chest symmetric Cardio vascular:  S1/S2, RRR, No murmure, No Rubs or Gallops  pulmonary: Clear to auscultation bilaterally, respirations unlabored, negative  wheezes / crackles Abdomen: Soft, non-tender, non-distended, bowel sounds,no masses, no organomegaly Muscular skeletal: Limited exam - in bed, able to move all 4 extremities, Normal strength,  Neuro: CNII-XII intact. , normal motor and sensation, reflexes intact  Extremities: No pitting edema lower extremities, +2 pulses  Skin: Dry, warm to touch, negative for any Rashes, No open wounds Wounds: per nursing documentation    ------------------------------------------------------------------------------------------------------------------------------------------    LABs:  CBC Latest Ref Rng & Units 11/01/2019 10/31/2019 03/13/2019  WBC 4.0 - 10.5 K/uL 8.0 8.4 9.4  Hemoglobin 13.0 - 17.0 g/dL 33.8 25.0 53.9  Hematocrit 39 - 52 % 39.2 42.3 39.8  Platelets 150 - 400 K/uL 178 192 229   CMP Latest Ref Rng & Units 11/01/2019 10/31/2019 07/23/2019  Glucose 70 - 99 mg/dL 767(H) 419(F) 790(W)  BUN 6 - 20 mg/dL 19 14 18   Creatinine 0.61 - 1.24 mg/dL 4.09(B) 3.53(G) 9.92  Sodium 135 - 145 mmol/L 139 138 140  Potassium 3.5 - 5.1 mmol/L 3.5 3.7 4.5  Chloride 98 - 111 mmol/L 106 103 106  CO2 22 - 32 mmol/L 22 25 19(L)  Calcium 8.9 - 10.3 mg/dL 4.2(A) 9.0 9.5  Total Protein 6.5 - 8.1 g/dL 7.1 7.7 -  Total Bilirubin 0.3 - 1.2 mg/dL 8.3(M) 1.9(Q) -  Alkaline Phos 38 - 126 U/L 45 51 -  AST 15 - 41 U/L 21 24 -  ALT 0 - 44 U/L 22 24 -       Micro Results Recent Results (from the past 240 hour(s))  Respiratory Panel by RT PCR (Flu A&B, Covid) - Nasopharyngeal Swab     Status: None   Collection Time: 10/31/19  6:07 PM   Specimen: Nasopharyngeal Swab  Result Value Ref Range Status   SARS Coronavirus 2 by RT PCR NEGATIVE NEGATIVE Final    Comment: (NOTE) SARS-CoV-2 target nucleic acids are NOT DETECTED.  The SARS-CoV-2 RNA is generally detectable in upper respiratoy specimens during the acute phase of infection. The lowest concentration of SARS-CoV-2 viral copies this assay can detect is 131  copies/mL. A negative result does not preclude SARS-Cov-2 infection and should not be used as the sole basis for treatment or other patient management decisions. A negative result may occur with  improper specimen collection/handling, submission of specimen other than nasopharyngeal swab, presence of viral mutation(s) within the areas targeted by this assay, and inadequate number of viral copies (<131 copies/mL). A negative result must be combined with clinical observations, patient history, and epidemiological information. The expected result is Negative.  Fact Sheet for Patients:  https://www.moore.com/  Fact Sheet for Healthcare Providers:  https://www.young.biz/  This test is no t yet approved or cleared by the [place] FDA and  has been authorized for detection and/or diagnosis of SARS-CoV-2 by FDA under an Emergency Use Authorization (EUA). This EUA will remain  in effect (meaning this test can be used) for the duration of the COVID-19 declaration under Section 564(b)(1) of the Act, 21 U.S.C. section 360bbb-3(b)(1), unless the authorization is terminated or revoked sooner.     Influenza A by PCR NEGATIVE NEGATIVE Final   Influenza B by PCR NEGATIVE NEGATIVE Final    Comment: (NOTE) The Xpert Xpress SARS-CoV-2/FLU/RSV assay is intended as an aid in  the diagnosis of influenza from Nasopharyngeal swab specimens and  should not be used as a sole basis for treatment. Nasal washings and  aspirates are unacceptable for Xpert Xpress SARS-CoV-2/FLU/RSV  testing.  Fact Sheet for Patients: https://www.moore.com/  Fact Sheet for Healthcare Providers: https://www.young.biz/  This test is not yet approved or cleared by the [place] FDA and  has been authorized for detection and/or diagnosis of SARS-CoV-2 by  FDA under an Emergency Use Authorization (EUA). This EUA will remain  in effect (meaning  this test can be used) for the duration of the  Covid-19 declaration under Section 564(b)(1) of the Act, 21  U.S.C. section 360bbb-3(b)(1), unless the authorization is  terminated or revoked. Performed at [facility], [address] [place], [place] [address]     Radiology Reports DG Chest Portable 1 View  Result Date: 10/31/2019 CLINICAL DATA:  Shortness of breath. EXAM: PORTABLE CHEST 1 VIEW COMPARISON:  April 17, 2018 FINDINGS: The heart size is enlarged. There is no pneumothorax. No significant pleural effusion. No focal infiltrate. No acute osseous abnormality. IMPRESSION: Cardiomegaly without edema or focal infiltrate. Electronically Signed   By: Katherine Mantle M.D.   On: 10/31/2019 18:54    SIGNED: Kendell Bane, MD, FACP, FHM. [facility],  Pager (please use [email or web address] to page/text)  If 7PM-7AM, please contact night-coverage [email or web address], Password [facility] 11/01/2019, 8:04 AM

## 2019-11-01 NOTE — Progress Notes (Signed)
Notified by CCMD that patient was ringing asystole on telemetry.  Nurse entered room and patient dressed in street clothes and on phone talking with family members. Pt had removed telemetry monitor, states his family is on way to pick him up.  Reports he is leaving to go take his B/P medication. Informed that meds will be given here in hospital.  States previous RN would not give his B/P medication.  Informed that I would bring meds in for him to take.  Reports being upset with conflicting information regarding medications and when he would be discharged.  Able to provide information and convince patient to not leave AMA. Medications given.

## 2019-11-01 NOTE — Progress Notes (Signed)
Cardiac catheterization Diffuse moderate disease noted in the LAD, diagonal branch, circumflex RCA Discussed with interventional cardiology -Depressed ejection fraction/cardiomyopathy is out of proportion to his underlying coronary disease -Medical management recommended at this time with high intensity statin, aspirin, ARB, beta-blocker -Echocardiogram pending -Consider transition to [facility].  Will need to confirm insurance issues or may need medical management assistance --Markedly elevated right heart pressures, -We will continue Lasix IV twice daily  Signed, Dossie Arbour, MD, Ph.D [facility]

## 2019-11-02 ENCOUNTER — Encounter: Payer: Self-pay | Admitting: Internal Medicine

## 2019-11-02 ENCOUNTER — Other Ambulatory Visit: Payer: Self-pay | Admitting: Nurse Practitioner

## 2019-11-02 DIAGNOSIS — N182 Chronic kidney disease, stage 2 (mild): Secondary | ICD-10-CM

## 2019-11-02 LAB — CBC
HCT: 39.1 % (ref 39.0–52.0)
Hemoglobin: 13.2 g/dL (ref 13.0–17.0)
MCH: 31.4 pg (ref 26.0–34.0)
MCHC: 33.8 g/dL (ref 30.0–36.0)
MCV: 93.1 fL (ref 80.0–100.0)
Platelets: 171 10*3/uL (ref 150–400)
RBC: 4.2 MIL/uL — ABNORMAL LOW (ref 4.22–5.81)
RDW: 14 % (ref 11.5–15.5)
WBC: 8.1 10*3/uL (ref 4.0–10.5)
nRBC: 0 % (ref 0.0–0.2)

## 2019-11-02 LAB — BASIC METABOLIC PANEL
Anion gap: 11 (ref 5–15)
BUN: 23 mg/dL — ABNORMAL HIGH (ref 6–20)
CO2: 24 mmol/L (ref 22–32)
Calcium: 9.1 mg/dL (ref 8.9–10.3)
Chloride: 104 mmol/L (ref 98–111)
Creatinine, Ser: 1.35 mg/dL — ABNORMAL HIGH (ref 0.61–1.24)
GFR, Estimated: >60 mL/min (ref >60)
Glucose, Bld: 124 mg/dL — ABNORMAL HIGH (ref 70–99)
Potassium: 3.6 mmol/L (ref 3.5–5.1)
Sodium: 139 mmol/L (ref 135–145)

## 2019-11-02 LAB — HEMOGLOBIN A1C
Hgb A1c MFr Bld: 6.8 % — ABNORMAL HIGH (ref 4.8–5.6)
Mean Plasma Glucose: 148 mg/dL

## 2019-11-02 LAB — BRAIN NATRIURETIC PEPTIDE: B Natriuretic Peptide: 340.4 pg/mL — ABNORMAL HIGH (ref 0.0–100.0)

## 2019-11-02 LAB — GLUCOSE, CAPILLARY
Glucose-Capillary: 110 mg/dL — ABNORMAL HIGH (ref 70–99)
Glucose-Capillary: 116 mg/dL — ABNORMAL HIGH (ref 70–99)

## 2019-11-02 MED ORDER — FUROSEMIDE 40 MG PO TABS: 80.0000 mg | ORAL_TABLET | Freq: Every day | ORAL | Status: DC

## 2019-11-02 MED ORDER — ATORVASTATIN CALCIUM 40 MG PO TABS: 40.0000 mg | ORAL_TABLET | Freq: Every day | ORAL | 1 refills | Status: DC

## 2019-11-02 MED ORDER — ASPIRIN 81 MG PO CHEW
81.0000 mg | CHEWABLE_TABLET | Freq: Every day | ORAL | Status: AC
  Administered 2019-11-02: 81 mg via ORAL
  Filled 2019-11-02: qty 1

## 2019-11-02 MED ORDER — ASPIRIN 81 MG PO CHEW: 81.0000 mg | CHEWABLE_TABLET | Freq: Every day | ORAL | 0 refills | Status: AC

## 2019-11-02 MED ORDER — ATORVASTATIN CALCIUM 20 MG PO TABS
40.0000 mg | ORAL_TABLET | Freq: Every day | ORAL | Status: DC
  Filled 2019-11-02: qty 2

## 2019-11-02 NOTE — Plan of Care (Signed)
  Problem: Education: Goal: Ability to verbalize understanding of medication therapies will improve Outcome: Not Progressing Rang out requesting Lasix due to cough and dyspnea on exertion.  Explained medication scheduled every 12hours and not on as needed schedule.  Dyspnea resolved with rest and head of bed elevated. Goal: Individualized Educational Video(s) Outcome: Progressing Heart failure videos watched this shift.  Reinforcement given and needed with booklet.

## 2019-11-02 NOTE — Progress Notes (Addendum)
Progress Note  Patient Name: Joshua Cordova Date of Encounter: 11/02/2019  Primary Cardiologist: Julien Nordmann, MD  Subjective   Feels much better.  Has ambulated w/o dyspnea.  Eager to go home. Recognizes now that over the past month, he has been going to [employer] for lunch pretty much everyday, and thinks that dypnea was coming on for several days.    Inpatient Medications    Scheduled Meds: . furosemide  40 mg Intravenous Q12H  . insulin aspart  0-9 Units Subcutaneous Q4H  . losartan  25 mg Oral Daily  . metoprolol succinate  25 mg Oral Daily  . sodium chloride flush  3 mL Intravenous Q12H   Continuous Infusions:  PRN Meds: acetaminophen, albuterol, ALPRAZolam, guaiFENesin-dextromethorphan, ondansetron (ZOFRAN) IV   Vital Signs    Vitals:   11/01/19 2008 11/01/19 2311 11/02/19 0407 11/02/19 0416  BP: (!) 132/94 (!) 132/92 (!) 129/94   Pulse: 94 93 86   Resp: 20 20 18    Temp: 98.1 F (36.7 C) 98.7 F (37.1 C) 98.7 F (37.1 C)   TempSrc: Oral Oral Oral   SpO2: 96% 98% 96%   Weight:    98.2 kg  Height:        Intake/Output Summary (Last 24 hours) at 11/02/2019 0719 Last data filed at 11/02/2019 0348 Gross per 24 hour  Intake 240 ml  Output 900 ml  Net -660 ml   Filed Weights   10/31/19 1440 11/01/19 0915 11/02/19 0416  Weight: 100.4 kg 100.4 kg 98.2 kg    Physical Exam   GEN: Well nourished, well developed, in no acute distress.  HEENT: Grossly normal.  Neck: Supple, no JVD, carotid bruits, or masses. Cardiac: RRR, no murmurs, rubs, or gallops. No clubbing, cyanosis, edema.  Radials/DP/PT 2+ and equal bilaterally. R groin cath site w/o bleeding/bruit/hematoma. Respiratory:  Respirations regular and unlabored, clear to auscultation bilaterally. GI: Soft, nontender, nondistended, BS + x 4. MS: no deformity or atrophy. Skin: warm and dry, no rash. Neuro:  Strength and sensation are intact. Psych: AAOx3.  Normal affect.  Labs    Chemistry Recent Labs    Lab 10/31/19 1141 11/01/19 0456 11/02/19 0530  NA 138 139 139  K 3.7 3.5 3.6  CL 103 106 104  CO2 25 22 24   GLUCOSE 125* 117* 124*  BUN 14 19 23*  CREATININE 1.29* 1.25* 1.35*  CALCIUM 9.0 8.8* 9.1  PROT 7.7 7.1  --   ALBUMIN 4.3 3.8  --   AST 24 21  --   ALT 24 22  --   ALKPHOS 51 45  --   BILITOT 1.6* 1.6*  --   GFRNONAA >60 >60 >60  ANIONGAP 10 11 11      Hematology Recent Labs  Lab 10/31/19 1141 11/01/19 0456 11/02/19 0530  WBC 8.4 8.0 8.1  RBC 4.61 4.25 4.20*  HGB 15.0 13.4 13.2  HCT 42.3 39.2 39.1  MCV 91.8 92.2 93.1  MCH 32.5 31.5 31.4  MCHC 35.5 34.2 33.8  RDW 13.7 13.8 14.0  PLT 192 178 171    Cardiac Enzymes  Recent Labs  Lab 10/31/19 1141 10/31/19 1449 10/31/19 1709 10/31/19 2314 11/01/19 0055  TROPONINIHS 36* 33* 33* 39* 41*      BNP Recent Labs  Lab 10/31/19 1141 11/02/19 0522  BNP 289.3* 340.4*     Lipids  Lab Results  Component Value Date   CHOL 198 04/17/2018   HDL 27.50 (L) 04/17/2018   LDLCALC 140 (H) 04/17/2018  LDLDIRECT 162.0 09/06/2016   TRIG 156.0 (H) 04/17/2018   CHOLHDL 7 04/17/2018    HbA1c  Lab Results  Component Value Date   HGBA1C 6.8 (H) 10/31/2019    Radiology    DG Chest Portable 1 View  Result Date: 10/31/2019 CLINICAL DATA:  Shortness of breath. EXAM: PORTABLE CHEST 1 VIEW COMPARISON:  April 17, 2018 FINDINGS: The heart size is enlarged. There is no pneumothorax. No significant pleural effusion. No focal infiltrate. No acute osseous abnormality. IMPRESSION: Cardiomegaly without edema or focal infiltrate. Electronically Signed   By: Katherine Mantle M.D.   On: 10/31/2019 18:54    Telemetry    RSR, 70-90s - Personally Reviewed  Cardiac Studies   Cardiac Catheterization 6.15.2021  Procedural Findings: Hemodynamics:  AO 98/68 mean 81 LV 98/19/30  Coronary angiography:  Coronary dominance: Right Left mainstem: Large vessel that bifurcates into the LAD and left circumflex, no significant  disease noted Left anterior descending (LAD): Large vessel that extends to the apical region, diagonal branch 2 of moderate size, 60% mid LAD disease, 70% proximal diagonal disease Left circumflex (LCx): Large vessel with OM branch 2, 40% mid vessel disease, 50% mid OM2 disease Right coronary artery (RCA): Right dominant vessel with PL and PDA, 30% mid vessel disease, luminal irregularities Left ventriculography: Left ventricular systolic function not performed secondary to contrast load and uncertainty whether intervention would be needed, echo is pending Known dilated cardiomyopathy -No significant aortic valve stenosis on LV pressure measurements, pullback  Right heart pressures RA 11 RV 61/9/15 PA 61/27/43 Wedge 28   Final Conclusions:  Nonobstructive disease of the LAD, circumflex Discussed with interventional cardiology, Cardiomyopathy felt out of proportion to underlying coronary disease, medical management recommended -Moderately elevated right heart pressures  Recommendations:  We will pursue heart failure regimen, aggressive diuresis given elevated wedge pressure, moderately elevated right heart pressures -Continue losartan, metoprolol, Lasix IV twice daily -Consider transition to Entresto if insurance permits _____________    Patient Profile     49 y.o. male with history of dilated cardiomyopathy with unclear etiology and previous ischemic work-up declined 2/2 insurance issues/barriers to care, chronic systolic heart failure with EF 25% (04/2018), diabetes, previous tobacco use, obesity, asthma, prescription medication reluctance, and barriers to care including insurance, who was seen in clinic 10/14 w/ c/o chest pain and dyspnea and was sent to ED for admission.  S/p cath this admission w/ mod nonobs CAD and elevated R heart pressures.  Assessment & Plan    1.  Acute on chronic HF w/ Reduced EF/NICM:  EF 25% by echo 04/2018 (repeat echo pending this admission.  Admitted  from office 10/14 in setting of c/p and dyspnea.  HsTrops minimally elevated w/ flat trend.  CXR and labs otw unremarkable w/ exception of CKD 2 (creat 1.29 on admission).  Cath 10/14 w/ mod nonobs CAD and mod elev R heart pressures (PA 61/27/43, PCWP 28).  Minus 660 overnight.  Wt down 2.2 kg this AM @ 98.2 kg (standing scale).  Dry wt was 98kg.  It looks like his echo was done yesterday though not read.  He feels much better.  Appears euvolemic this AM.  Ambulated w/o difficulty last night/this AM.  He is eager to go home.  We discussed his dietary habits and he says that in the past month, he sort of fell off with lunch, as he was going to [employer] every day and eating fries and a [place] wrap.  We discussed salt load and restaurant/fast foods and  he understands where he probably went wrong.  With dietary compliance, it is likely that previous home dose of Lasix-80 mg daily-will be sufficient.  His BUN and creatinine bumped slightly with diuresis and contrast yesterday.  He will receive one more dose of IV Lasix this morning with a plan to start back Lasix 80 mg oral daily tomorrow.  Continue current doses of metoprolol succinate and losartan.  We did discuss potentially switching him to [facility].  He does not currently have insurance though is eligible for it through his work.  We can readdress on the outpatient side, at which time we can transition to [facility], provide samples, and get him assistance, at least until he has insurance.  We'll also need to consider addition of spiro and [place].  I suspect he can go home this afternoon with plan for early cardiology follow-up next week and basic metabolic panel at that time.  2.  Nonobstructive CAD: moderate nonobs 3 vessel dzs.  As above, HsTrops minimally elevated w/ flat trend - likely demand ischemia in setting of CHF.  He has not had any chest pain.  We discussed the importance of secondary prevention.  Continue beta-blocker.  I will add aspirin and  statin.  3.  DMII:  A1c 6.8.  Was not taking anything @ home-lost a lot of weight.  Ideally would look to add farxiga - will depend on affordability.  4.  CKDII:  BUN/creat up sl s/p cath and w/ diuresis.  We'll plan to follow-up labs in office next week.  5.  HL:  LDL 140 last year.  Adding statin in setting of mod CAD.  Will need outpt f/u labs in ~ 6 wks.  Signed, Nicolasa Ducking, NP  11/02/2019, 7:19 AM    Patient examined chart reviewed Patient ready for d/c CHF improved Discussed dietary improvements and less fast food Has outpatient f/u with Dr Mariah Milling  Charlton Haws MD [facility] For questions or updates, please contact   Please consult [email or web address] for contact info under Cardiology/STEMI.

## 2019-11-02 NOTE — Progress Notes (Signed)
Patient alert and oriented, vss, no complaints of pain.  Escorted out of hospital via wheelchair by volunteers.    D/c telemetry and pivs.  F/u with cardiology.

## 2019-11-02 NOTE — Discharge Summary (Addendum)
Physician Discharge Summary [facility]    Patient: Joshua Cordova                   Admit date: 10/31/2019   DOB: Aug 05, 1970             Discharge date:11/02/2019/10:50 AM QQV:[id number]                          PCP: Karie Schwalbe, MD  Disposition: HOME Recommendations for Outpatient Follow-up:   . Follow up: in 1 week-with your cardiologist  Discharge Condition: Stable   Code Status:   Code Status: Full Code  Diet recommendation: Cardiac diet   Discharge Diagnoses:    Active Problems:   Essential hypertension   Asthma, allergic, mild intermittent, uncomplicated   Diabetes mellitus type 2, diet-controlled (HCC)   Chronic systolic CHF (congestive heart failure) (HCC)   Unstable angina (HCC)   Elevated troponin   Acute on chronic systolic CHF (congestive heart failure) (HCC)   History of Present Illness/ Hospital Course Charline Bills Summary:   HPI: Joshua Cordova a 49 y.o.malewith medical history significant of CHF, morbid obesity, Dm2, asthma, HTN  Presented withIncreased weight and orthopnea was seen by cardiology in clinic today and was sent to Er. Denies any CP but had an episode of chest pressure associate with SOB On that day he had no meds. He felt his heart was racing, cough productive of yellow sputum Had to use 6 pillows last night due to orthopnea.  He had worsening dyspnea with exertion. His symptoms have gotten a bit better in Am and he went to his cardiology office Reports he had his meds today.  Pt with dilated cardiomyopathy EF 255 April 2020 did not have ischemic work up due to lack of insurance  Has not been compliant with BP meds Per cardiology note from 10/13Recommend IV heparin, ASA, continued BB, and initiation of statinbefore dischargeNPOafter midnight in preparation for [facility] with tentative plan to occuron 10/15.  Reports he does not want to take any medications for DM2    Assessment & Plan:   Active  Problems:   Essential hypertension   Asthma, allergic, mild intermittent, uncomplicated   Diabetes mellitus type 2, diet-controlled (HCC)   Chronic systolic CHF (congestive heart failure) (HCC)   Unstable angina (HCC)   Elevated troponin   Acute on chronic systolic CHF (congestive heart failure) (HCC)   Unstable angina -Stable denied having chest pain Status post cardiac cath: Cardiac catheterization 11/01/2019 Findings: Diffuse moderate coronary artery disease, moderate LAD disease, diagonal branches, RCA, depressed ejection fraction, acute cardiomyopathy, -Cardiology recommended medical management high intensity statins, aspirin, ARB, beta-blocker   - troponin33 -33 >>> 41 -nonspecificchanges on ECG   - we will Cont. aspirin - Lipid panel, LDL:  -  hgA1C; 6.8  - TSH : 2.448 Cardiology rec heparin drip NPO post midnight a Obtain Urine tox -clear negative CXr: Cardiomegaly without edema -Cardiology following --- clearing patient for discharge      Acute onChronic systolic CHF  -sCHF (congestive heart failure) (HCC) - as per cardiolgy  - ReDSvest today 39%,consistent with volume overload.  -With known previous ejection fraction of 20% in 2020 -elevated BNP 289.3 >>  - Clinic weight today elevated from that of previous weight 216lbs >> 221lbs >>  -Echo:  To be reviewed by cardiology-pending final official report -Plan forR/LHCon 10/1519 --- finding consistent with reduced LV function, coronary artery disease, -Anticipating initiation of BB, ARB's,  statin -monitor Renal Fx creatinine 1.29, 1.25, 1.35 Continued lasix switch from IV to p.o. 80 mg daily Defer to cardiology regarding further diuresis    Essential hypertension  -Remained stable - continue Toprolol and cozaar   Asthma, allergic, mild intermittent, uncomplicated - stable  - chronic stable   Pre-DM2- poorly controlled pt staes he does not want to take any medications for DM.  States "iknow what Metformin can do to you" -A1c 6.8 -Not any medication at this time -Recommended follow-up with PCP -CBGs during hospitalization 139, 135, 99, 110, 160 -Blood sugar remains controlled, with diabetic diet at this time Recommending follow-up with her PCP, consider restarting oral diabetic medication  Disposition Plan:To home      Discharge Instructions:   Discharge Instructions    Activity as tolerated - No restrictions   Complete by: As directed    Diet - low sodium heart healthy   Complete by: As directed    Discharge instructions   Complete by: As directed    Follow-up with your cardiologist next week continue current medication, your medication may be adjusted titrated by your cardiology   Increase activity slowly   Complete by: As directed        Medication List    STOP taking these medications   glucose blood test strip Commonly known as: GE100 Blood Glucose Test     TAKE these medications   albuterol 108 (90 Base) MCG/ACT inhaler Commonly known as: VENTOLIN HFA TAKE 2 PUFFS BY MOUTH EVERY 6 HOURS AS NEEDED FOR WHEEZE OR SHORTNESS OF BREATH   aspirin 81 MG chewable tablet Chew 1 tablet (81 mg total) by mouth daily.   atorvastatin 40 MG tablet Commonly known as: LIPITOR Take 1 tablet (40 mg total) by mouth daily.   furosemide 80 MG tablet Commonly known as: LASIX Take 1 tablet (80 mg total) by mouth daily.   ketoconazole 2 % cream Commonly known as: NIZORAL Apply 1 application topically 2 (two) times daily as needed for irritation.   losartan 25 MG tablet Commonly known as: COZAAR Take 1 tablet (25 mg total) by mouth daily.   metoprolol succinate 25 MG 24 hr tablet Commonly known as: Toprol XL Take 1 tablet (25 mg total) by mouth daily.   triamcinolone cream 0.1 % Commonly known as: KENALOG Apply 1 application topically 2 (two) times daily as needed.       Follow-up Information    [facility] Follow up on 11/13/2019.   Specialty: Cardiology Why: at 11:30am. Enter through the Medical Mall entrance Contact information: [address] [place] [address] [phone]       [name], MD Follow up in 1 week(s).   Specialty: Cardiology Why: we will contact you for follow-up Contact information: [address] [place] [address] [phone]        [facility] Follow up on 11/06/2019.   Why: please present to the medical mall lab @ [facility] on 10/20 for blood chemistry Contact information: [address] [place] [address]             No Known Allergies   Procedures /Studies:   CARDIAC CATHETERIZATION  Result Date: 11/01/2019  2nd Mrg lesion is 50% stenosed.  Mid LAD lesion is 60% stenosed.  2nd Diag lesion is 70% stenosed.  Prox Cx to Mid Cx lesion is 40% stenosed.  Mid RCA lesion is 30%  stenosed.  RPAV lesion is 40% stenosed.  Hemodynamic findings consistent with moderate pulmonary hypertension.    DG Chest Portable 1 View  Result Date: 10/31/2019 CLINICAL DATA:  Shortness of breath. EXAM: PORTABLE CHEST 1 VIEW COMPARISON:  April 17, 2018 FINDINGS: The heart size is enlarged. There is no pneumothorax. No significant pleural effusion. No focal infiltrate. No acute osseous abnormality. IMPRESSION: Cardiomegaly without edema or focal infiltrate. Electronically Signed   By: Katherine Mantle M.D.   On: 10/31/2019 18:54    Subjective:   Patient was seen and examined 11/02/2019, 10:50 AM Patient stable today. No acute distress.  No issues overnight Stable for discharge.  Discharge Exam:    Vitals:   11/01/19 2311 11/02/19 0407 11/02/19 0416 11/02/19 0838  BP: (!) 132/92 (!) 129/94  120/83  Pulse: 93 86  77  Resp: 20 18  19   Temp: 98.7 F (37.1 C) 98.7 F (37.1 C)  97.6 F (36.4 C)  TempSrc: Oral Oral  Oral  SpO2: 98% 96%  100%    Weight:   98.2 kg   Height:        General: Pt lying comfortably in bed & appears in no obvious distress. Cardiovascular: S1 & S2 heard, RRR, S1/S2 +. No murmurs, rubs, gallops or clicks. No JVD or pedal edema. Respiratory: Clear to auscultation without wheezing, rhonchi or crackles. No increased work of breathing. Abdominal:  Non-distended, non-tender & soft. No organomegaly or masses appreciated. Normal bowel sounds heard. CNS: Alert and oriented. No focal deficits. Extremities: no edema, no cyanosis    The results of significant diagnostics from this hospitalization (including imaging, microbiology, ancillary and laboratory) are listed below for reference.      Microbiology:   Recent Results (from the past 240 hour(s))  Respiratory Panel by RT PCR (Flu A&B, Covid) - Nasopharyngeal Swab     Status: None   Collection Time: 10/31/19  6:07 PM   Specimen: Nasopharyngeal Swab  Result Value Ref Range Status   SARS Coronavirus 2 by RT PCR NEGATIVE NEGATIVE Final    Comment: (NOTE) SARS-CoV-2 target nucleic acids are NOT DETECTED.  The SARS-CoV-2 RNA is generally detectable in upper respiratoy specimens during the acute phase of infection. The lowest concentration of SARS-CoV-2 viral copies this assay can detect is 131 copies/mL. A negative result does not preclude SARS-Cov-2 infection and should not be used as the sole basis for treatment or other patient management decisions. A negative result may occur with  improper specimen collection/handling, submission of specimen other than nasopharyngeal swab, presence of viral mutation(s) within the areas targeted by this assay, and inadequate number of viral copies (<131 copies/mL). A negative result must be combined with clinical observations, patient history, and epidemiological information. The expected result is Negative.  Fact Sheet for Patients:  11/02/19  Fact Sheet for Healthcare Providers:   https://www.moore.com/  This test is no t yet approved or cleared by the https://www.young.biz/ FDA and  has been authorized for detection and/or diagnosis of SARS-CoV-2 by FDA under an Emergency Use Authorization (EUA). This EUA will remain  in effect (meaning this test can be used) for the duration of the COVID-19 declaration under Section 564(b)(1) of the Act, 21 U.S.C. section 360bbb-3(b)(1), unless the authorization is terminated or revoked sooner.     Influenza A by PCR NEGATIVE NEGATIVE Final   Influenza B by PCR NEGATIVE NEGATIVE Final    Comment: (NOTE) The Xpert Xpress SARS-CoV-2/FLU/RSV assay is intended as an aid in  the diagnosis  of influenza from Nasopharyngeal swab specimens and  should not be used as a sole basis for treatment. Nasal washings and  aspirates are unacceptable for Xpert Xpress SARS-CoV-2/FLU/RSV  testing.  Fact Sheet for Patients: https://www.moore.com/  Fact Sheet for Healthcare Providers: https://www.young.biz/  This test is not yet approved or cleared by the [place] FDA and  has been authorized for detection and/or diagnosis of SARS-CoV-2 by  FDA under an Emergency Use Authorization (EUA). This EUA will remain  in effect (meaning this test can be used) for the duration of the  Covid-19 declaration under Section 564(b)(1) of the Act, 21  U.S.C. section 360bbb-3(b)(1), unless the authorization is  terminated or revoked. Performed at [facility], [address] [place], [place] [address]      Labs:   CBC: Recent Labs  Lab 10/31/19 1141 11/01/19 0456 11/02/19 0530  WBC 8.4 8.0 8.1  NEUTROABS  --  4.1  --   HGB 15.0 13.4 13.2  HCT 42.3 39.2 39.1  MCV 91.8 92.2 93.1  PLT 192 178 171   Basic Metabolic Panel: Recent Labs  Lab 10/31/19 1141 10/31/19 1935 11/01/19 0456 11/02/19 0530  NA 138  --  139 139  K 3.7  --  3.5 3.6  CL 103  --  106 104  CO2 25  --  22 24   GLUCOSE 125*  --  117* 124*  BUN 14  --  19 23*  CREATININE 1.29*  --  1.25* 1.35*  CALCIUM 9.0  --  8.8* 9.1  MG  --  2.0  --   --    Liver Function Tests: Recent Labs  Lab 10/31/19 1141 11/01/19 0456  AST 24 21  ALT 24 22  ALKPHOS 51 45  BILITOT 1.6* 1.6*  PROT 7.7 7.1  ALBUMIN 4.3 3.8   BNP (last 3 results) Recent Labs    10/31/19 1141 11/02/19 0522  BNP 289.3* 340.4*   Cardiac Enzymes: No results for input(s): CKTOTAL, CKMB, CKMBINDEX, TROPONINI in the last 168 hours. CBG: Recent Labs  Lab 11/01/19 1650 11/01/19 2006 11/01/19 2355 11/02/19 0416 11/02/19 0840  GLUCAP 139* 135* 99 110* 116*   Hgb A1c Recent Labs    10/31/19 1935  HGBA1C 6.8*   Lipid Profile No results for input(s): CHOL, HDL, LDLCALC, TRIG, CHOLHDL, LDLDIRECT in the last 72 hours. Thyroid function studies Recent Labs    10/31/19 1935  TSH 2.448   Anemia work up No results for input(s): VITAMINB12, FOLATE, FERRITIN, TIBC, IRON, RETICCTPCT in the last 72 hours. Urinalysis    Component Value Date/Time   COLORURINE YELLOW (A) 11/01/2019 0055   APPEARANCEUR HAZY (A) 11/01/2019 0055   LABSPEC 1.022 11/01/2019 0055   PHURINE 5.0 11/01/2019 0055   GLUCOSEU NEGATIVE 11/01/2019 0055   HGBUR SMALL (A) 11/01/2019 0055   BILIRUBINUR NEGATIVE 11/01/2019 0055   KETONESUR NEGATIVE 11/01/2019 0055   PROTEINUR >=300 (A) 11/01/2019 0055   NITRITE NEGATIVE 11/01/2019 0055   LEUKOCYTESUR NEGATIVE 11/01/2019 0055         Time coordinating discharge: Over 45 minutes  SIGNED: Kendell Bane, MD, FACP, FHM. [facility],  Please use [email or web address] to Page If 7PM-7AM, please contact night-coverage [email or web address], Password [facility] 11/02/2019, 10:50 AM

## 2019-11-04 ENCOUNTER — Encounter: Payer: Self-pay | Admitting: Cardiovascular Disease

## 2019-11-04 ENCOUNTER — Telehealth: Payer: Self-pay

## 2019-11-04 ENCOUNTER — Telehealth: Payer: Self-pay | Admitting: Cardiovascular Disease

## 2019-11-04 LAB — ECHOCARDIOGRAM COMPLETE
AR max vel: 1.28 cm2
AV Area VTI: 1.36 cm2
AV Area mean vel: 1.22 cm2
AV Mean grad: 2.5 mmHg
AV Peak grad: 4.4 mmHg
Ao pk vel: 1.05 m/s
Area-P 1/2: 5.46 cm2
Calc EF: 14.8 %
Height: 71 in
S' Lateral: 6.72 cm
Single Plane A2C EF: 17.4 %
Single Plane A4C EF: 19.1 %
Weight: 3541.47 oz

## 2019-11-04 NOTE — Telephone Encounter (Signed)
TCM....  Patient is being discharged     They are scheduled to see  [name] 10-26 at 4  They were seen for  cath this admission w/ mod nonobs CAD and elevated R heart pressures.  They need to be seen within 7 days     Please call

## 2019-11-04 NOTE — Telephone Encounter (Signed)
Post hospital discharge call to patient.  He states he is doing pretty good, still has some "tinges of shortness of breath".  Was able to sleep last night on 2 pillows, prior to admission was using 6. He has not tried to lie flat, but will give it a try.    Discussed medications he is taking as ordered except he did not have the money to fill the Lipitor, he is also concerned about the side effects of the cholesterol lowering medication. Suggested he call the office and see if they have samples, he voices understanding.  He also wondered about returning to work, hospitalitis cleared him for this Wednesday, suggested if he feels this is too soon to discuss with [name].  He is not weighing himself  daily as he does not have a scale, but will go out and get one.  Also discussed his diet.  Mother is doing the cooking/baking.  No added salt, fresh and frozen vegetables.  He also has appoint for blood work on the 20 of October and sees Inetta Fermo NP in the Heart failure clinic on October 21.  Tresa Endo RN, [facility]

## 2019-11-04 NOTE — Telephone Encounter (Signed)
Patient calling in to state he does not feel like he is ready to go back to work at this time. Patient was discharged 10/16 and is expected to return to work on 10/20 after his cath.Patient wanting to know if anything can be done to keep him out of work longer  Please longer

## 2019-11-04 NOTE — Telephone Encounter (Signed)
Patient contacted regarding discharge from [facility] on 11/02/19.  Patient understands to follow up with provider [name] on 11/12/19 at 4:00PM at [facility]. Patient understands discharge instructions? Yes Patient understands medications and regiment? Yes Patient understands to bring all medications to this visit? Yes  Patient is not enrolled in My Chart.  Encouraged to enroll in My Chart if at all possible. He stated that he might work on it.

## 2019-11-04 NOTE — Telephone Encounter (Signed)
Spoke with patient earlier and reviewed that he should not return to work until his follow up appointment and that we would provide letter at that time due to his absence.

## 2019-11-11 ENCOUNTER — Telehealth: Payer: Self-pay

## 2019-11-11 NOTE — Progress Notes (Signed)
Date:  11/12/2019   ID:  [name], DOB Jan 16, 1971, MRN [id number]  Patient Location:  [address] [place] [address]   Provider location:   [facility], [place] office  PCP:  Karie Schwalbe, MD  Cardiologist:  [email or web address]  Chief Complaint  Patient presents with  . office visit    F/U after cardiac cath and echo; Meds verbally reviewed with patient.    History of Present Illness:    Joshua Cordova is a 49 y.o. male  past medical history of Diabetes Obesity Former smoker Asthma Prescription medication reluctance, no insurance, Dilated cardiomyopathy unclear etiology, ischemic work-up declined secondary to insurance issues Who presents for f/u of his chronic systolic CHF, EF 26% 2018/05/06 Prior studies reviewed with him Ejection fraction November 01, 2019 estimated less than 20%  Underwent cardiac catheterization November 01, 2019 Nonobstructive disease of the LAD, circumflex Cardiomyopathy felt out of proportion to underlying coronary disease, medical management recommended -Moderately elevated right heart pressures  recommendation made to -Continue losartan, metoprolol, Lasix IV twice daily while he was in the hospital then transition to oral Lasix Blood pressure is low limiting initiation of Entresto  In follow-up today reports his diet is very strict, overall doing well Weight continues to trend downward, was 221 in the hospital now to 212 at home Denies any abdominal distention, leg swelling  He is cut out all fast food  Feels a little sleepy, otherwise feels well Weight at home 212, today  Most recent A1c in the 6.8 range May 05, 2016 A1c 12.24 November 2016 down to 8.8, September 2019 6.9  Medication list includes Lasix 80 daily, losartan 25 mg daily, metoprolol succinate 25 daily  Father died 05-06-2015 He took care of his father for several years  Significant stress at that time   Used to work delivering parts/engines, lots  of walking Has not been back to work since mid October  EKG personally reviewed by myself on todays visit NSR rate 82, nonspecific T wave abnormality   Past Medical History:  Diagnosis Date  . Chronic systolic heart failure (HCC)   . Hypertension   . NICM (nonischemic cardiomyopathy) [facility])    Past Surgical History:  Procedure Laterality Date  . CARDIAC CATHETERIZATION    . RIGHT/LEFT HEART CATH AND CORONARY ANGIOGRAPHY N/A 11/01/2019   Procedure: RIGHT/LEFT HEART CATH AND CORONARY ANGIOGRAPHY;  Surgeon: [name], MD;  Location: [facility] INVASIVE CV LAB;  Service: Cardiovascular;  Laterality: N/A;     Current Meds  Medication Sig  . albuterol (VENTOLIN HFA) 108 (90 Base) MCG/ACT inhaler TAKE 2 PUFFS BY MOUTH EVERY 6 HOURS AS NEEDED FOR WHEEZE OR SHORTNESS OF BREATH  . aspirin 81 MG chewable tablet Chew 1 tablet (81 mg total) by mouth daily.  Marland Kitchen atorvastatin (LIPITOR) 40 MG tablet Take 1 tablet (40 mg total) by mouth daily.  . furosemide (LASIX) 80 MG tablet Take 1 tablet (80 mg total) by mouth daily.  Marland Kitchen losartan (COZAAR) 25 MG tablet Take 1 tablet (25 mg total) by mouth daily.  . metoprolol succinate (TOPROL XL) 25 MG 24 hr tablet Take 1 tablet (25 mg total) by mouth daily.     Allergies:   Patient has no known allergies.   Social History   Tobacco Use  . Smoking status: Former Smoker    Packs/day: 1.00    Types: Cigarettes  . Smokeless tobacco: Never Used  Vaping Use  . Vaping Use: Never  used  Substance Use Topics  . Alcohol use: No  . Drug use: Never      Family Hx: The patient's family history includes Diabetes in his maternal grandfather and mother; Heart disease in his father and another family member; Hypertension in his father.  ROS:   Please see the history of present illness.    Review of Systems  Constitutional: Negative.   HENT: Negative.   Respiratory: Negative.   Cardiovascular: Negative.   Gastrointestinal: Negative.   Musculoskeletal:  Negative.   Neurological: Negative.   Psychiatric/Behavioral: Negative.   All other systems reviewed and are negative.    Labs/Other Tests and Data Reviewed:    Recent Labs: 10/31/2019: Magnesium 2.0; TSH 2.448 11/01/2019: ALT 22 11/02/2019: B Natriuretic Peptide 340.4; BUN 23; Creatinine, Ser 1.35; Hemoglobin 13.2; Platelets 171; Potassium 3.6; Sodium 139   Recent Lipid Panel Lab Results  Component Value Date/Time   CHOL 198 04/17/2018 12:02 PM   TRIG 156.0 (H) 04/17/2018 12:02 PM   HDL 27.50 (L) 04/17/2018 12:02 PM   CHOLHDL 7 04/17/2018 12:02 PM   LDLCALC 140 (H) 04/17/2018 12:02 PM   LDLDIRECT 162.0 09/06/2016 12:34 PM    Wt Readings from Last 3 Encounters:  11/12/19 212 lb (96.2 kg)  11/02/19 216 lb 8 oz (98.2 kg)  10/31/19 221 lb 4 oz (100.4 kg)     Exam:   BP 104/80 (BP Location: Left Arm, Patient Position: Sitting, Cuff Size: Large)   Pulse 82   Ht 5\' 11"  (1.803 m)   Wt 212 lb (96.2 kg)   SpO2 98%   BMI 29.57 kg/m  Constitutional:  oriented to person, place, and time. No distress.  HENT:  Head: Grossly normal Eyes:  no discharge. No scleral icterus.  Neck: No JVD, no carotid bruits  Cardiovascular: Regular rate and rhythm, no murmurs appreciated Pulmonary/Chest: Clear to auscultation bilaterally, no wheezes or rails Abdominal: Soft.  no distension.  no tenderness.  Musculoskeletal: Normal range of motion Neurological:  normal muscle tone. Coordination normal. No atrophy Skin: Skin warm and dry Psychiatric: normal affect, pleasant   ASSESSMENT & PLAN:    Chronic systolic CHF Continue metoprolol, losartan, Lasix at current dose Scheduled to see CHF clinic tomorrow, we will delay the several weeks as he is relatively stable BMP today in the hospital lobby He will monitor blood pressure at home, call with some numbers Ideally would like to transition him to [facility] but blood pressure low and he has fatigue and is losing weight through dietary  changes We will hold off on any changes for now  Dilated cardiomyopathy Nonischemic cardiomyopathy Ejection fraction 25% on echocardiogram 2020 More recent ejection fraction 2021 with EF less than 20% Nonischemic by catheterization, low EF out of proportion to underlying disease -He has changed his habits, no smoking, no alcohol, strict diet, compliance with his medications -We will need repeat echocardiogram several months time  Essential hypertension Continue metoprolol succinate 25 daily, losartan 25 daily BP low but he is not orthostatic  Diabetes mellitus type 2, diet-controlled (HCC) Dramatic improvement in numbers over the past year   Total encounter time more than 25 minutes  Greater than 50% was spent in counseling and coordination of care with the patient    Signed, 2022, MD  11/12/2019 4:07 PM    [facility] [address], [place], [place]

## 2019-11-11 NOTE — Telephone Encounter (Signed)
Spoke to pt to see how he was doing after recent hospital stay for CHF. He said he is feeling great. I advised him Dr Alphonsus Sias is wanting to see him int he next few weeks. He said he would call back to schedule.

## 2019-11-12 ENCOUNTER — Ambulatory Visit (INDEPENDENT_AMBULATORY_CARE_PROVIDER_SITE_OTHER): Payer: Self-pay | Admitting: Cardiovascular Disease

## 2019-11-12 ENCOUNTER — Other Ambulatory Visit
Admission: RE | Admit: 2019-11-12 | Discharge: 2019-11-12 | Disposition: A | Discharge status: Home / Self Care | Payer: Self-pay | Source: Ambulatory Visit | Attending: Cardiovascular Disease | Admitting: Cardiovascular Disease

## 2019-11-12 ENCOUNTER — Encounter: Payer: Self-pay | Admitting: Cardiovascular Disease

## 2019-11-12 ENCOUNTER — Encounter: Payer: Self-pay | Admitting: *Deleted

## 2019-11-12 ENCOUNTER — Other Ambulatory Visit: Payer: Self-pay

## 2019-11-12 VITALS — BP 104/80 | HR 82 | Ht 71.0 in | Wt 212.0 lb

## 2019-11-12 DIAGNOSIS — R0602 Shortness of breath: Secondary | ICD-10-CM | POA: Insufficient documentation

## 2019-11-12 DIAGNOSIS — I10 Essential (primary) hypertension: Secondary | ICD-10-CM | POA: Insufficient documentation

## 2019-11-12 DIAGNOSIS — I420 Dilated cardiomyopathy: Secondary | ICD-10-CM | POA: Insufficient documentation

## 2019-11-12 DIAGNOSIS — N182 Chronic kidney disease, stage 2 (mild): Secondary | ICD-10-CM

## 2019-11-12 DIAGNOSIS — E119 Type 2 diabetes mellitus without complications: Secondary | ICD-10-CM

## 2019-11-12 DIAGNOSIS — I5022 Chronic systolic (congestive) heart failure: Secondary | ICD-10-CM

## 2019-11-12 LAB — BASIC METABOLIC PANEL
Anion gap: 10 (ref 5–15)
BUN: 28 mg/dL — ABNORMAL HIGH (ref 6–20)
CO2: 24 mmol/L (ref 22–32)
Calcium: 9.4 mg/dL (ref 8.9–10.3)
Chloride: 103 mmol/L (ref 98–111)
Creatinine, Ser: 1.55 mg/dL — ABNORMAL HIGH (ref 0.61–1.24)
GFR, Estimated: 55 mL/min — ABNORMAL LOW (ref >60)
Glucose, Bld: 102 mg/dL — ABNORMAL HIGH (ref 70–99)
Potassium: 4.3 mmol/L (ref 3.5–5.1)
Sodium: 137 mmol/L (ref 135–145)

## 2019-11-12 MED ORDER — METOPROLOL SUCCINATE ER 25 MG PO TB24
25.0000 mg | ORAL_TABLET | Freq: Every day | ORAL | 3 refills | Status: DC
Start: 2019-11-12 — End: 2020-07-27

## 2019-11-12 MED ORDER — FUROSEMIDE 80 MG PO TABS: 80.0000 mg | ORAL_TABLET | Freq: Every day | ORAL | 3 refills | Status: DC

## 2019-11-12 MED ORDER — LOSARTAN POTASSIUM 25 MG PO TABS: 25.0000 mg | ORAL_TABLET | Freq: Every day | ORAL | 3 refills | Status: DC

## 2019-11-12 MED ORDER — ATORVASTATIN CALCIUM 40 MG PO TABS: 40.0000 mg | ORAL_TABLET | Freq: Every day | ORAL | 3 refills | Status: DC

## 2019-11-12 NOTE — Patient Instructions (Addendum)
Work note out Oct 14th to Nov 1st Light duty, nothing over 20 pounds   Joshua Cordova push out to next month: Rescheduled to 12/16/2019 at 10:30 AM per [name] request. Please arrive at 10:00 AM to allow for screening and check in process.  Refills   Medication Instructions:  No changes  If you need a refill on your cardiac medications before your next appointment, please call your pharmacy.    Lab work: [employer] today Please go to [facility] Entrance and check in at registration for labs to be done. Give them lab slip so they know what is needed today.   If you have labs (blood work) drawn today and your tests are completely normal, you will receive your results only by: Marland Kitchen MyChart Message (if you have MyChart) OR . A paper copy in the mail If you have any lab test that is abnormal or we need to change your treatment, we will call you to review the results.   Testing/Procedures: No new testing needed   Follow-Up: At [facility], you and your health needs are our priority.  As part of our continuing mission to provide you with exceptional heart care, we have created designated Provider Care Teams.  These Care Teams include your primary Cardiologist (physician) and Advanced Practice Providers (APPs -  Physician Assistants and Nurse Practitioners) who all work together to provide you with the care you need, when you need it.  . You will need a follow up appointment in 2 months  . Providers on your designated Care Team:   . Nicolasa Ducking, NP . Eula Listen, PA-C . Marisue Ivan, PA-C  Any Other Special Instructions Will Be Listed Below (If Applicable).  COVID-19 Vaccine Information can be found at: [email or web address] For questions related to vaccine distribution or appointments, please email [email or web address] or call [phone].

## 2019-11-13 ENCOUNTER — Ambulatory Visit: Payer: Self-pay | Admitting: Family

## 2019-11-14 ENCOUNTER — Encounter: Payer: Self-pay | Admitting: *Deleted

## 2019-11-14 ENCOUNTER — Telehealth: Payer: Self-pay | Admitting: Cardiovascular Disease

## 2019-11-14 NOTE — Telephone Encounter (Signed)
Spoke with patient and he was concerned about lower blood pressures and not feeling well. Blood pressure while I was on the phone was 110/72. Yesterday he started taking 1/2 a pill of the metoprolol. He denied any swelling and no shortness of breath. Advised to take medications as directed and take metoprolol in the evening. Advised I would send to provider for review and would be in touch with any recommendations. He verbalized understanding with no further questions at this time.   Current cardiac medications: Metoprolol succinate 25 mg once daily (Recommended that he take in the evening) Losartan 25 mg once daily Furosemide 80 mg once daily

## 2019-11-14 NOTE — Telephone Encounter (Signed)
Pt c/o BP issue: STAT if pt c/o blurred vision, one-sided weakness or slurred speech  1. What are your last 5 BP readings? 112/83 104/72   2. Are you having any other symptoms (ex. Dizziness, headache, blurred vision, passed out)? Tired   3. What is your BP issue?    Patient feels bp too low and he is not going to take metoprolol today he would like advice on other meds.   Patient asked what bp normal is and he is not sure but says it was low at the office visit  .     Patient asked about HR .  He reports machine says 43

## 2019-11-17 NOTE — Telephone Encounter (Signed)
Would cut the Lasix back to 40 mg daily based on recent lab work We would like a low blood pressure sometimes even in the high 90s as it can prevent congestive heart failure episodes so numbers running 100 up to 110 are at goal Much of the fatigue could be the cardiac pump is still recovering Would consider cardiac rehab 3 days a week at [facility] Ideally need echocardiogram to 3 months after last study in mid October which would put it mid December or after Each of the medications is essential to bringing the heart pump back up including losartan metoprolol Lasix Ideally would like to add additional medications but we have been limited by low blood pressure

## 2019-11-19 ENCOUNTER — Other Ambulatory Visit: Payer: Self-pay | Admitting: *Deleted

## 2019-11-19 DIAGNOSIS — N182 Chronic kidney disease, stage 2 (mild): Secondary | ICD-10-CM

## 2019-11-19 DIAGNOSIS — I5023 Acute on chronic systolic (congestive) heart failure: Secondary | ICD-10-CM

## 2019-11-19 DIAGNOSIS — E119 Type 2 diabetes mellitus without complications: Secondary | ICD-10-CM

## 2019-11-19 NOTE — Telephone Encounter (Signed)
Spoke with patient and reviewed provider recommendations and he was agreeable with this plan. Order placed for rehab services and also sending message over to them.

## 2019-12-16 ENCOUNTER — Telehealth: Payer: Self-pay | Admitting: Family

## 2019-12-16 ENCOUNTER — Ambulatory Visit: Payer: Self-pay | Admitting: Family

## 2019-12-16 NOTE — Progress Notes (Deleted)
   Patient ID: Kiet Geer, male    DOB: 12/28/1970, 49 y.o.   MRN: [id number]  HPI  Mr Mclean is a 49 y/o male with a history of   Echo report from 11/01/19 reviewed and showed an EF of <20% along with severe LAE and moderate MR.   RHC/LHC done 11/01/19 showed:  2nd Mrg lesion is 50% stenosed.  Mid LAD lesion is 60% stenosed.  2nd Diag lesion is 70% stenosed.  Prox Cx to Mid Cx lesion is 40% stenosed.  Mid RCA lesion is 30% stenosed.  RPAV lesion is 40% stenosed.  Hemodynamic findings consistent with moderate pulmonary hypertension.  Admitted 10/31/19 due to acute on chronic HF along with unstable angina. Cardiology consult obtained. Initially placed on heparin drip and cath was done. ReDS vest reading 39%. Initially needed IV lasix with transition to oral diuretics. Discharged after 2 days.   He presents today for his initial visit with a chief complaint of   Review of Systems    Physical Exam    Assessment & Plan:  1: Chronic heart failure with reduced ejection fraction- - NYHA class - saw cardiology Mariah Milling) 11/12/19 - BNP 11/02/19 was 340.4  2: HTN- - BP - saw PCP Alphonsus Sias) 05/22/19 - BMP 11/12/19 reviewed and showed sodium 137, potassium 4.3, creatinine 1.55 and GFR 55   3: DM- - A1c 10/31/19 was 6.8%

## 2019-12-16 NOTE — Telephone Encounter (Signed)
Patient did not show for his [facility] Failure [facility] appointment on 12/16/19. Will attempt to reschedule.

## 2020-01-12 ENCOUNTER — Other Ambulatory Visit: Payer: Self-pay | Admitting: Internal Medicine

## 2020-01-13 NOTE — Progress Notes (Signed)
Date:  01/15/2020   ID:  Joshua Cordova, DOB Jan 11, 1971, MRN [id number]  Patient Location:  [address] [place] [address]   Provider location:   [facility], [place] office  PCP:  Karie Schwalbe, MD  Cardiologist:  [email or web address]  Chief Complaint  Patient presents with  . Follow-up    2 month. "doing well." Patient is out of Atorvastatin for about x 2 weeks.     History of Present Illness:    Joshua Cordova is a 49 y.o. male  past medical history of Diabetes Obesity Former smoker Asthma Prescription medication reluctance, no insurance, Dilated cardiomyopathy unclear etiology, ischemic work-up declined secondary to insurance issues Who presents for f/u of his chronic systolic CHF, EF 32% 05-15-2018 Prior studies reviewed with him Ejection fraction November 01, 2019 estimated less than 20%  Underwent cardiac catheterization November 01, 2019 Nonobstructive disease of the LAD, circumflex -Moderately elevated right heart pressures -Continue losartan, metoprolol, lasix Blood pressure is low limiting initiation of Entresto  In follow-up today reports feeling well Weight stable, down several pounds Continues to work full-time, eats healthy Minimal fast food Denies any abdominal distention no leg swelling  Does not check blood pressure at home Compliant with his medications  Medication list includes Lasix 80 daily, losartan 25 mg daily, metoprolol succinate 25 daily  Most recent A1c in the 6.8 range 2016-05-14 A1c 12.24 November 2016 down to 8.8, 2019/09/286.9  Father died 2015-05-15 He took care of his father for several years  Significant stress at that time   EKG personally reviewed by myself on todays visit NSR rate 89, nonspecific T wave abnormality   Past Medical History:  Diagnosis Date  . Chronic systolic heart failure (HCC)   . Hypertension   . NICM (nonischemic cardiomyopathy) [facility])    Past Surgical History:  Procedure  Laterality Date  . CARDIAC CATHETERIZATION    . RIGHT/LEFT HEART CATH AND CORONARY ANGIOGRAPHY N/A 11/01/2019   Procedure: RIGHT/LEFT HEART CATH AND CORONARY ANGIOGRAPHY;  Surgeon: [name], MD;  Location: [facility] INVASIVE CV LAB;  Service: Cardiovascular;  Laterality: N/A;     Current Meds  Medication Sig  . albuterol (VENTOLIN HFA) 108 (90 Base) MCG/ACT inhaler TAKE 2 PUFFS BY MOUTH EVERY 6 HOURS AS NEEDED FOR WHEEZE OR SHORTNESS OF BREATH  . furosemide (LASIX) 80 MG tablet Take 1 tablet (80 mg total) by mouth daily.  Marland Kitchen losartan (COZAAR) 25 MG tablet Take 1 tablet (25 mg total) by mouth daily.  . metoprolol succinate (TOPROL XL) 25 MG 24 hr tablet Take 1 tablet (25 mg total) by mouth daily.     Allergies:   Patient has no known allergies.   Social History   Tobacco Use  . Smoking status: Former Smoker    Packs/day: 1.00    Types: Cigarettes  . Smokeless tobacco: Never Used  Vaping Use  . Vaping Use: Never used  Substance Use Topics  . Alcohol use: No  . Drug use: Never      Family Hx: The patient's family history includes Diabetes in his maternal grandfather and mother; Heart disease in his father and another family member; Hypertension in his father.  ROS:   Please see the history of present illness.    Review of Systems  Constitutional: Negative.   HENT: Negative.   Respiratory: Negative.   Cardiovascular: Negative.   Gastrointestinal: Negative.   Musculoskeletal: Negative.   Neurological: Negative.  Psychiatric/Behavioral: Negative.   All other systems reviewed and are negative.    Labs/Other Tests and Data Reviewed:    Recent Labs: 10/31/2019: Magnesium 2.0; TSH 2.448 11/01/2019: ALT 22 11/02/2019: B Natriuretic Peptide 340.4; Hemoglobin 13.2; Platelets 171 11/12/2019: BUN 28; Creatinine, Ser 1.55; Potassium 4.3; Sodium 137   Recent Lipid Panel Lab Results  Component Value Date/Time   CHOL 198 04/17/2018 12:02 PM   TRIG 156.0 (H) 04/17/2018  12:02 PM   HDL 27.50 (L) 04/17/2018 12:02 PM   CHOLHDL 7 04/17/2018 12:02 PM   LDLCALC 140 (H) 04/17/2018 12:02 PM   LDLDIRECT 162.0 09/06/2016 12:34 PM    Wt Readings from Last 3 Encounters:  01/15/20 216 lb 6 oz (98.1 kg)  11/12/19 212 lb (96.2 kg)  11/02/19 216 lb 8 oz (98.2 kg)     Exam:   BP 120/80 (BP Location: Left Arm, Patient Position: Sitting, Cuff Size: Normal)   Pulse 89   Ht 5\' 11"  (1.803 m)   Wt 216 lb 6 oz (98.1 kg)   SpO2 98%   BMI 30.18 kg/m  Constitutional:  oriented to person, place, and time. No distress.  HENT:  Head: Grossly normal Eyes:  no discharge. No scleral icterus.  Neck: No JVD, no carotid bruits  Cardiovascular: Regular rate and rhythm, no murmurs appreciated Pulmonary/Chest: Clear to auscultation bilaterally, no wheezes or rails Abdominal: Soft.  no distension.  no tenderness.  Musculoskeletal: Normal range of motion Neurological:  normal muscle tone. Coordination normal. No atrophy Skin: Skin warm and dry Psychiatric: normal affect, pleasant   ASSESSMENT & PLAN:    Chronic systolic CHF Continue metoprolol, losartan, Lasix at current dose He will monitor blood pressure at home We will hope to start Entresto 24/26 mg p.o. twice daily in the new year when he gets insurance BMP in the next week or so  Dilated cardiomyopathy Nonischemic cardiomyopathy Ejection fraction 25% on echocardiogram 2020 More recent ejection fraction 2021 with EF less than 20% Nonischemic by catheterization, low EF out of proportion to underlying disease Stable, consider starting Entresto as above in the new year  Essential hypertension Continue metoprolol succinate 25 daily, losartan 25 daily Blood pressure low, stable, he will monitor pressures at home and call 2022 with numbers  Diabetes mellitus type 2, diet-controlled (HCC) Dramatic improvement in numbers over the past year   Total encounter time more than 25 minutes  Greater than 50% was spent in  counseling and coordination of care with the patient    Signed, [place], MD  01/15/2020 2:34 PM    [facility] [address], [place], [place]

## 2020-01-15 ENCOUNTER — Other Ambulatory Visit: Payer: Self-pay

## 2020-01-15 ENCOUNTER — Encounter: Payer: Self-pay | Admitting: Cardiovascular Disease

## 2020-01-15 ENCOUNTER — Ambulatory Visit (INDEPENDENT_AMBULATORY_CARE_PROVIDER_SITE_OTHER): Payer: Self-pay | Admitting: Cardiovascular Disease

## 2020-01-15 VITALS — BP 120/80 | HR 89 | Ht 71.0 in | Wt 216.4 lb

## 2020-01-15 DIAGNOSIS — E119 Type 2 diabetes mellitus without complications: Secondary | ICD-10-CM

## 2020-01-15 DIAGNOSIS — I420 Dilated cardiomyopathy: Secondary | ICD-10-CM

## 2020-01-15 DIAGNOSIS — R0602 Shortness of breath: Secondary | ICD-10-CM

## 2020-01-15 DIAGNOSIS — I10 Essential (primary) hypertension: Secondary | ICD-10-CM

## 2020-01-15 DIAGNOSIS — N182 Chronic kidney disease, stage 2 (mild): Secondary | ICD-10-CM

## 2020-01-15 DIAGNOSIS — I5022 Chronic systolic (congestive) heart failure: Secondary | ICD-10-CM

## 2020-01-15 NOTE — Patient Instructions (Addendum)
Monitor blood pressure If it runs 120 or above on the top, call the office We might be able to use entresto  Medication Instructions:  No changes  If you need a refill on your cardiac medications before your next appointment, please call your pharmacy.    Lab work: CMP in January  Walk into medical mall at the check in desk, they will direct you to lab registration, hours for labs are Monday-Friday 07:00am-5:30pm (no appointment necessary)    If you have labs (blood work) drawn today and your tests are completely normal, you will receive your results only by: Marland Kitchen MyChart Message (if you have MyChart) OR . A paper copy in the mail If you have any lab test that is abnormal or we need to change your treatment, we will call you to review the results.   Testing/Procedures: No new testing needed   Follow-Up: At [facility], you and your health needs are our priority.  As part of our continuing mission to provide you with exceptional heart care, we have created designated Provider Care Teams.  These Care Teams include your primary Cardiologist (physician) and Advanced Practice Providers (APPs -  Physician Assistants and Nurse Practitioners) who all work together to provide you with the care you need, when you need it.  . You will need a follow up appointment in 6 months  . Providers on your designated Care Team:   . Nicolasa Ducking, NP . Eula Listen, PA-C . Marisue Ivan, PA-C  Any Other Special Instructions Will Be Listed Below (If Applicable).  COVID-19 Vaccine Information can be found at: PodExchange.nl For questions related to vaccine distribution or appointments, please email [email or web address] or call [phone].    Blood Pressure Record Sheet To take your blood pressure, you will need a blood pressure machine. You can buy a blood pressure machine (blood pressure monitor) at your clinic, drug store, or  online. When choosing one, consider:  An automatic monitor that has an arm cuff.  A cuff that wraps snugly around your upper arm. You should be able to fit only one finger between your arm and the cuff.  A device that stores blood pressure reading results.  Do not choose a monitor that measures your blood pressure from your wrist or finger. Follow your health care provider's instructions for how to take your blood pressure. To use this form:  Get one reading in the morning (a.m.) before you take any medicines.  Get one reading in the evening (p.m.) before supper.  Take at least 2 readings with each blood pressure check. This makes sure the results are correct. Wait 1-2 minutes between measurements.  Write down the results in the spaces on this form.  Repeat this once a week, or as told by your health care provider.  Make a follow-up appointment with your health care provider to discuss the results.  Blood pressure log Date: _______________________  a.m. _____________________(1st reading) _____________________(2nd reading)  p.m. _____________________(1st reading) _____________________(2nd reading) Date: _______________________  a.m. _____________________(1st reading) _____________________(2nd reading)  p.m. _____________________(1st reading) _____________________(2nd reading) Date: _______________________  a.m. _____________________(1st reading) _____________________(2nd reading)  p.m. _____________________(1st reading) _____________________(2nd reading) Date: _______________________  a.m. _____________________(1st reading) _____________________(2nd reading)  p.m. _____________________(1st reading) _____________________(2nd reading) Date: _______________________  a.m. _____________________(1st reading) _____________________(2nd reading)  p.m.  _____________________(1st reading) _____________________(2nd reading)    How to Take Your Blood Pressure You can take your blood pressure at home with a machine. You may need to check your blood pressure at home:  To check if you have high blood pressure (hypertension).  To check your blood pressure over time.  To make sure your blood pressure medicine is working. Supplies needed: You will need a blood pressure machine, or monitor. You can buy one at a drugstore or online. When choosing one:  Choose one with an arm cuff.  Choose one that wraps around your upper arm. Only one finger should fit between your arm and the cuff.  Do not choose one that measures your blood pressure from your wrist or finger. Your doctor can suggest a monitor. How to prepare Avoid these things for 30 minutes before checking your blood pressure:  Drinking caffeine.  Drinking alcohol.  Eating.  Smoking.  Exercising. Five minutes before checking your blood pressure:  Pee.  Sit in a dining chair. Avoid sitting in a soft couch or armchair.  Be quiet. Do not talk. How to take your blood pressure Follow the instructions that came with your machine. If you have a digital blood pressure monitor, these may be the instructions: 1. Sit up straight. 2. Place your feet on the floor. Do not cross your ankles or legs. 3. Rest your left arm at the level of your heart. You may rest it on a table, desk, or chair. 4. Pull up your shirt sleeve. 5. Wrap the blood pressure cuff around the upper part of your left arm. The cuff should be 1 inch (2.5 cm) above your elbow. It is best to wrap the cuff around bare skin. 6. Fit the cuff snugly around your arm. You should be able to place only one finger between the cuff and your arm. 7. Put the cord inside the groove of your elbow. 8. Press the power button. 9. Sit quietly while the cuff fills with air and loses air. 10. Write down the numbers on the screen. 11. Wait  2-3 minutes and then repeat steps 1-10. What do the numbers mean? Two numbers make up your blood pressure. The first number is called systolic pressure. The second is called diastolic pressure. An example of a blood pressure reading is "120 over 80" (or 120/80). If you are an adult and do not have a medical condition, use this guide to find out if your blood pressure is normal: Normal  First number: below 120.  Second number: below 80. Elevated  First number: 120-129.  Second number: below 80. Hypertension stage 1  First number: 130-139.  Second number: 80-89. Hypertension stage 2  First number: 140 or above.  Second number: 90 or above. Your blood pressure is above normal even if only the top or bottom number is above normal. Follow these instructions at home:  Check your blood pressure as often as your doctor tells you to.  Take your monitor to your next doctor's appointment. Your doctor will: ? Make sure you are using it correctly. ? Make sure  it is working right.  Make sure you understand what your blood pressure numbers should be.  Tell your doctor if your medicines are causing side effects. Contact a doctor if:  Your blood pressure keeps being high. Get help right away if:  Your first blood pressure number is higher than 180.  Your second blood pressure number is higher than 120. This information is not intended to replace advice given to you by your health care provider. Make sure you discuss any questions you have with your health care provider. Document Revised: 12/16/2016 Document Reviewed: 06/12/2015 Elsevier Patient Education  2020 [employer].

## 2020-01-16 NOTE — Progress Notes (Deleted)
   Patient ID: Joshua Cordova, male    DOB: 01/05/1971, 49 y.o.   MRN: [id number]  HPI  Mr Depoy is a 49 y/o male with a history of  Echo report from 11/01/19 reviewed and showed an EF of <20% along with severe LAE and moderate MR.   RHC/LHC done 11/01/19 and showed:  2nd Mrg lesion is 50% stenosed.  Mid LAD lesion is 60% stenosed.  2nd Diag lesion is 70% stenosed.  Prox Cx to Mid Cx lesion is 40% stenosed.  Mid RCA lesion is 30% stenosed.  RPAV lesion is 40% stenosed.  Hemodynamic findings consistent with moderate pulmonary hypertension.  Admitted 10/31/19 due to shortness of breath and fluid overload. Cardiology consult obtained. Cath done with results per above. Initially given IV lasix with transition to oral diuretics. ReDS vest reading at 39%. Does not want diabetes treated.     Discharged after 2 days.   He presents today for his initial visit with a chief complaint of   Review of Systems    Physical Exam    Assessment & Plan:  1: Chronic heart failure with reduced ejection fraction- - NYHA class  2: HTN- - BP  3: DM-

## 2020-01-20 ENCOUNTER — Ambulatory Visit: Payer: Self-pay | Admitting: Family

## 2020-01-24 ENCOUNTER — Telehealth: Payer: Self-pay | Admitting: Internal Medicine

## 2020-01-24 NOTE — Telephone Encounter (Signed)
Pt called in wanted to know about what to do due to he is coughing with plhegm and that he can not sleep at night , I did offer him a virtual on Wednesday but he wanted to see Dr. Alphonsus Sias sooner.

## 2020-01-24 NOTE — Telephone Encounter (Signed)
Attempted to call pt, received a busy signal.

## 2020-01-29 NOTE — Telephone Encounter (Signed)
Left message to call office

## 2020-02-12 ENCOUNTER — Ambulatory Visit: Payer: Self-pay | Admitting: Family

## 2020-03-24 ENCOUNTER — Telehealth: Payer: Self-pay | Admitting: Cardiovascular Disease

## 2020-03-24 MED ORDER — LOSARTAN POTASSIUM 25 MG PO TABS
25.0000 mg | ORAL_TABLET | Freq: Every day | ORAL | 0 refills | Status: DC
Start: 2020-03-24 — End: 2020-07-27

## 2020-03-24 NOTE — Telephone Encounter (Signed)
Requested Prescriptions   Signed Prescriptions Disp Refills   losartan (COZAAR) 25 MG tablet 90 tablet 0    Sig: Take 1 tablet (25 mg total) by mouth daily.    Authorizing Provider: [name]    Ordering User: Thayer Headings, Nikitia Asbill L

## 2020-03-24 NOTE — Telephone Encounter (Signed)
*  STAT* If patient is at the pharmacy, call can be transferred to refill team.   1. Which medications need to be refilled? (please list name of each medication and dose if known) losartan 25 MG 1 tablet daily   2. Which pharmacy/location (including street and city if local pharmacy) is medication to be sent to? [facility]   3. Do they need a 30 day or 90 day supply? 90 day

## 2020-04-06 ENCOUNTER — Ambulatory Visit: Payer: Self-pay | Admitting: Cardiovascular Disease

## 2020-04-14 ENCOUNTER — Other Ambulatory Visit: Payer: Self-pay | Admitting: Internal Medicine

## 2020-04-17 ENCOUNTER — Ambulatory Visit: Payer: Self-pay | Admitting: Physician Assistant

## 2020-05-12 ENCOUNTER — Other Ambulatory Visit: Payer: Self-pay | Admitting: Internal Medicine

## 2020-05-19 ENCOUNTER — Ambulatory Visit: Payer: Self-pay | Admitting: Cardiovascular Disease

## 2020-06-21 ENCOUNTER — Other Ambulatory Visit: Payer: Self-pay | Admitting: Internal Medicine

## 2020-07-16 ENCOUNTER — Other Ambulatory Visit: Payer: Self-pay | Admitting: Internal Medicine

## 2020-07-26 NOTE — Progress Notes (Signed)
Date:  07/27/2020   ID:  [name], DOB 06-15-1970, MRN [id number]  Patient Location:  [address] [place] [address]   Provider location:   [facility], [place] office  PCP:  Karie Schwalbe, MD  Cardiologist:  [email or web address]  Chief Complaint  Patient presents with   6 month follow up     "Doing well." Medications reviewed by the patient verbally.     History of Present Illness:    Joshua Cordova is a 50 y.o. male  past medical history of Diabetes Obesity Former smoker Asthma Prescription medication reluctance, no insurance, Dilated cardiomyopathy , nonischemic Who presents for f/u of his chronic systolic CHF, EF 26% 2018/05/22 Last seen in clinic by myself December 2021 Ejection fraction November 01, 2019 estimated less than 20%  cardiac catheterization November 01, 2019 Nonobstructive disease of the LAD, circumflex -Moderately elevated right heart pressures  He has done well on losartan, metoprolol, lasix daily Blood pressure previously low limiting initiation of Entresto Blood pressure continues to run low, 100 systolic today Occasionally takes extra Lasix reports weight in fluid status has been relatively stable  Working at [employer], should get insurance soon through [employer] Would like to do lab work when [employer] in  Last lab work October 2021, creatinine 1.55 BUN 28 A1c in the 6.8 range 05-21-16 A1c 12.24 November 2016 down to 8.8, 2019-10-056.9  Father died May 22, 2015 He took care of his father for several years   EKG personally reviewed by myself on todays visit NSR rate 85, nonspecific T wave abnormality   Past Medical History:  Diagnosis Date   Chronic systolic heart failure (HCC)    Hypertension    NICM (nonischemic cardiomyopathy) (HCC)    Past Surgical History:  Procedure Laterality Date   CARDIAC CATHETERIZATION     RIGHT/LEFT HEART CATH AND CORONARY ANGIOGRAPHY N/A 11/01/2019   Procedure:  RIGHT/LEFT HEART CATH AND CORONARY ANGIOGRAPHY;  Surgeon: [name], MD;  Location: [facility] INVASIVE CV LAB;  Service: Cardiovascular;  Laterality: N/A;     Current Outpatient Medications on File Prior to Visit  Medication Sig Dispense Refill   albuterol (VENTOLIN HFA) 108 (90 Base) MCG/ACT inhaler TAKE 2 PUFFS BY MOUTH EVERY 6 HOURS AS NEEDED FOR WHEEZE OR SHORTNESS OF BREATH 18 each 0   No current facility-administered medications on file prior to visit.    Allergies:   Patient has no known allergies.   Social History   Tobacco Use   Smoking status: Former    Packs/day: 1.00    Pack years: 0.00    Types: Cigarettes    Quit date: 2016/05/21    Years since quitting: 4.5   Smokeless tobacco: Never  Vaping Use   Vaping Use: Never used  Substance Use Topics   Alcohol use: No   Drug use: Never      Family Hx: The patient's family history includes Diabetes in his maternal grandfather and mother; Heart disease in his father and another family member; Hypertension in his father.  ROS:   Please see the history of present illness.    Review of Systems  Constitutional: Negative.   HENT: Negative.    Respiratory: Negative.    Cardiovascular: Negative.   Gastrointestinal: Negative.   Musculoskeletal: Negative.   Neurological: Negative.   Psychiatric/Behavioral: Negative.    All other systems reviewed and are negative.   Labs/Other Tests and Data Reviewed:  Recent Labs: 10/31/2019: Magnesium 2.0; TSH 2.448 11/01/2019: ALT 22 11/02/2019: B Natriuretic Peptide 340.4; Hemoglobin 13.2; Platelets 171 11/12/2019: BUN 28; Creatinine, Ser 1.55; Potassium 4.3; Sodium 137   Recent Lipid Panel Lab Results  Component Value Date/Time   CHOL 198 04/17/2018 12:02 PM   TRIG 156.0 (H) 04/17/2018 12:02 PM   HDL 27.50 (L) 04/17/2018 12:02 PM   CHOLHDL 7 04/17/2018 12:02 PM   LDLCALC 140 (H) 04/17/2018 12:02 PM   LDLDIRECT 162.0 09/06/2016 12:34 PM    Wt Readings from Last 3  Encounters:  07/27/20 213 lb 12 oz (97 kg)  01/15/20 216 lb 6 oz (98.1 kg)  11/12/19 212 lb (96.2 kg)     Exam:   BP 100/70 (BP Location: Left Arm, Patient Position: Sitting, Cuff Size: Normal)   Pulse 85   Ht 5\' 11"  (1.803 m)   Wt 213 lb 12 oz (97 kg)   SpO2 97%   BMI 29.81 kg/m  Constitutional:  oriented to person, place, and time. No distress.  HENT:  Head: Grossly normal Eyes:  no discharge. No scleral icterus.  Neck: No JVD, no carotid bruits  Cardiovascular: Regular rate and rhythm, no murmurs appreciated Pulmonary/Chest: Clear to auscultation bilaterally, no wheezes or rails Abdominal: Soft.  no distension.  no tenderness.  Musculoskeletal: Normal range of motion Neurological:  normal muscle tone. Coordination normal. No atrophy Skin: Skin warm and dry Psychiatric: normal affect, pleasant   ASSESSMENT & PLAN:    Chronic systolic CHF Continue metoprolol, losartan, Lasix at current dose Reports he will be signing up for insurance in the very near future Would look to add digoxin, Jardiance Potentially could try Entresto though blood pressure is low Will hold off on spironolactone In general reports he is asymptomatic, occasionally takes extra Lasix if he has any shortness of breath when supine  Dilated cardiomyopathy Nonischemic cardiomyopathy Ejection fraction 25% on echocardiogram 2020 ejection fraction 2021 with EF less than 20% Nonischemic by catheterization, low EF out of proportion to underlying disease -Consider medication changes as above 1C get insurance  Essential hypertension Continue metoprolol succinate 25 daily, losartan 25 daily Low pressures, asymptomatic  Diabetes mellitus type 2, diet-controlled (HCC) Dramatic improvement in numbers   Hyperlipidemia He is concerned about taking a statin, recommend he continue on Lipitor 40 given moderate coronary disease on prior catheterization   Total encounter time more than 25 minutes  Greater than  50% was spent in counseling and coordination of care with the patient    Signed, 2022, MD  07/27/2020 12:26 PM    [facility] [address], [place], [place]

## 2020-07-27 ENCOUNTER — Encounter: Payer: Self-pay | Admitting: Cardiovascular Disease

## 2020-07-27 ENCOUNTER — Ambulatory Visit (INDEPENDENT_AMBULATORY_CARE_PROVIDER_SITE_OTHER): Payer: Self-pay | Admitting: Cardiovascular Disease

## 2020-07-27 ENCOUNTER — Other Ambulatory Visit: Payer: Self-pay

## 2020-07-27 VITALS — BP 100/70 | HR 85 | Ht 71.0 in | Wt 213.8 lb

## 2020-07-27 DIAGNOSIS — I5022 Chronic systolic (congestive) heart failure: Secondary | ICD-10-CM

## 2020-07-27 DIAGNOSIS — R079 Chest pain, unspecified: Secondary | ICD-10-CM

## 2020-07-27 DIAGNOSIS — E119 Type 2 diabetes mellitus without complications: Secondary | ICD-10-CM

## 2020-07-27 DIAGNOSIS — I10 Essential (primary) hypertension: Secondary | ICD-10-CM

## 2020-07-27 DIAGNOSIS — R0602 Shortness of breath: Secondary | ICD-10-CM

## 2020-07-27 DIAGNOSIS — I420 Dilated cardiomyopathy: Secondary | ICD-10-CM

## 2020-07-27 DIAGNOSIS — N182 Chronic kidney disease, stage 2 (mild): Secondary | ICD-10-CM

## 2020-07-27 MED ORDER — ATORVASTATIN CALCIUM 40 MG PO TABS
40.0000 mg | ORAL_TABLET | Freq: Every day | ORAL | 3 refills | Status: DC
Start: 2020-07-27 — End: 2020-12-15

## 2020-07-27 MED ORDER — FUROSEMIDE 80 MG PO TABS
80.0000 mg | ORAL_TABLET | Freq: Every day | ORAL | 3 refills | Status: DC
Start: 2020-07-27 — End: 2021-01-27

## 2020-07-27 MED ORDER — METOPROLOL SUCCINATE ER 25 MG PO TB24
25.0000 mg | ORAL_TABLET | Freq: Every day | ORAL | 3 refills | Status: DC
Start: 2020-07-27 — End: 2020-10-22

## 2020-07-27 MED ORDER — LOSARTAN POTASSIUM 25 MG PO TABS
25.0000 mg | ORAL_TABLET | Freq: Every day | ORAL | 0 refills | Status: DC
Start: 2020-07-27 — End: 2020-11-17

## 2020-07-27 NOTE — Patient Instructions (Addendum)
Medication Instructions:  Restart atorvastatin 40 mg Call if lots of cramps   If you need a refill on your cardiac medications before your next appointment, please call your pharmacy.   Lab work: No new labs needed  Testing/Procedures: No new testing needed   Follow-Up: At [facility], you and your health needs are our priority.  As part of our continuing mission to provide you with exceptional heart care, we have created designated Provider Care Teams.  These Care Teams include your primary Cardiologist (physician) and Advanced Practice Providers (APPs -  Physician Assistants and Nurse Practitioners) who all work together to provide you with the care you need, when you need it.  You will need a follow up appointment in 6 months  Providers on your designated Care Team:   Nicolasa Ducking, NP Eula Listen, PA-C Marisue Ivan, PA-C [place]ence [place], [place]   COVID-19 Vaccine Information can be found at: [email or web address] For questions related to vaccine distribution or appointments, please email [email or web address] or call [phone].

## 2020-10-21 NOTE — Progress Notes (Signed)
Cardiology Office Note:    Date:  10/22/2020   ID:  Joshua Cordova, DOB [birth year], MRN [id number]  PCP:  Karie Schwalbe, MD  [facility] Cardiologist:  Julien Nordmann, MD  [facility] Electrophysiologist:  None   Referring MD: Karie Schwalbe, MD   Chief Complaint: Shortness of breath  History of Present Illness:    Joshua Cordova is a 50 y.o. male with a hx of DM2, obesity, former smoker, asthma, NICM EF EF<20% in 2021, barriers to car eincluding insurance.    He has a long history of cardiomyopathy with EF 25% in 2020. Cardiac cath November 01, 2019 showed nonobstructive disease of the LAD Cx. In October 2021 EF noted to be <20%. Has not tolerated entresto in the pst due to hypotension.   Last seen 07/2020 and was doing well from a cardiac perspective. Consider digoxin or jardiance at follow-up.   Today, the patient reports he has been having shortness of breath. Was bending over and felt short of breath. Had some issues sleeping, said he was waking up short of breath. Family member noted episodes of slow or no breathing. No chest pain. No LLE, palpitations, lightheadedness, dizziness, fever, chills. Eating  and drinking normally. Has felt this SOB before. Stopped Toprol by himself one night he felt real pale and decided to stop it. He did not take his BP at that time, so unsure if it was actually low. Does not take BP at home.  Never started statin because of the possible side affects. Weights have been the same around 214-216. Eats low salt diet. Has lost weight 255>214lbs. Stopped exercising, but has been of work for the last 2 months. 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 He takes lasix 80mg  in the morning and then at night he would take a half one. Unsure if this would help.   Past Medical History:  Diagnosis Date   Chronic systolic heart failure (HCC)    Hypertension    NICM (nonischemic cardiomyopathy) (HCC)     Past Surgical History:  Procedure Laterality Date    CARDIAC CATHETERIZATION     RIGHT/LEFT HEART CATH AND CORONARY ANGIOGRAPHY N/A 11/01/2019   Procedure: RIGHT/LEFT HEART CATH AND CORONARY ANGIOGRAPHY;  Surgeon: 11/03/2019, MD;  Location: [facility] INVASIVE CV LAB;  Service: Cardiovascular;  Laterality: N/A;    Current Medications: Current Meds  Medication Sig   empagliflozin (JARDIANCE) 10 MG TABS tablet Take 1 tablet (10 mg) by mouth once daily   furosemide (LASIX) 80 MG tablet Take 1 tablet (80 mg total) by mouth daily.   losartan (COZAAR) 25 MG tablet Take 1 tablet (25 mg total) by mouth daily.     Allergies:   Patient has no known allergies.   Social History   Socioeconomic History   Marital status: Single    Spouse name: Not on file   Number of children: Not on file   Years of education: Not on file   Highest education level: Not on file  Occupational History   Occupation: Delivering auto parts    Comment: [employer]   Occupation:    Tobacco Use   Smoking status: Former    Packs/day: 1.00    Types: Cigarettes    Quit date: 2018    Years since quitting: 4.7   Smokeless tobacco: Never  Vaping Use   Vaping Use: Never used  Substance and Sexual Activity   Alcohol use: No   Drug use: Never  Sexual activity: Not on file  Other Topics Concern   Not on file  Social History Narrative   Not on file   Social Determinants of Health   Financial Resource Strain: Not on file  Food Insecurity: Not on file  Transportation Needs: Not on file  Physical Activity: Not on file  Stress: Not on file  Social Connections: Not on file     Family History: The patient's family history includes Diabetes in his maternal grandfather and mother; Heart disease in his father and another family member; Hypertension in his father.  ROS:   Please see the history of present illness.     All other systems reviewed and are negative.  EKGs/Labs/Other Studies Reviewed:    The following studies were reviewed today:  Echo  10/2019  1. Left ventricular ejection fraction, by estimation, is <20%. The left  ventricle has severely decreased function. The left ventricle demonstrates  global hypokinesis. The left ventricular internal cavity size was severely  dilated. Left ventricular  diastolic parameters are consistent with Grade II diastolic dysfunction  (pseudonormalization).   2. Right ventricular systolic function is normal. The right ventricular  size is normal. Tricuspid regurgitation signal is inadequate for assessing  PA pressure.   3. Left atrial size was severely dilated.   4. Right atrial size was mildly dilated.   5. The mitral valve is normal in structure. Moderate mitral valve  regurgitation. No evidence of mitral stenosis.   6. The aortic valve is normal in structure. Aortic valve regurgitation is  not visualized. Mild aortic valve sclerosis is present, with no evidence  of aortic valve stenosis.   7. The inferior vena cava is dilated in size with <50% respiratory  variability, suggesting right atrial pressure of 15 mmHg.   Cardiac cath 10/2019 Conclusion  2nd Mrg lesion is 50% stenosed. Mid LAD lesion is 60% stenosed. 2nd Diag lesion is 70% stenosed. Prox Cx to Mid Cx lesion is 40% stenosed. Mid RCA lesion is 30% stenosed. RPAV lesion is 40% stenosed. Hemodynamic findings consistent with moderate pulmonary hypertension.    Echo 04/2018  1. The left ventricle has severely reduced systolic function, with an  ejection fraction of 25-30%. The cavity size was severely dilated. There  is mildly increased left ventricular wall thickness. Left ventricular  diastolic Doppler parameters are  consistent with restrictive filling due to nondiagnostic images. Elevated  mean left atrial pressure.   2. The right ventricle has normal systolic function. The cavity was  normal. There is no increase in right ventricular wall thickness. Right  ventricular systolic pressure is moderately elevated with an  estimated  pressure of 68.3 mmHg.   3. Left atrial size was moderately dilated.   4. The mitral valve is degenerative. Mild thickening of the mitral valve  leaflet. Mitral valve regurgitation is moderate by color flow Doppler.   5. The tricuspid valve is not well visualized. Tricuspid valve  regurgitation is mild-moderate.   6. The aortic valve was not well visualized.   7. The aortic root and ascending aorta are normal in size and structure.   EKG:  EKG is  ordered today.  The ekg ordered today demonstrates NSR, LAD, 92bpm, LAD,   Recent Labs: 10/31/2019: Magnesium 2.0; TSH 2.448 11/01/2019: ALT 22 11/02/2019: B Natriuretic Peptide 340.4; Hemoglobin 13.2; Platelets 171 11/12/2019: BUN 28; Creatinine, Ser 1.55; Potassium 4.3; Sodium 137  Recent Lipid Panel    Component Value Date/Time   CHOL 198 04/17/2018 1202   TRIG 156.0 (  H) 04/17/2018 1202   HDL 27.50 (L) 04/17/2018 1202   CHOLHDL 7 04/17/2018 1202   VLDL 31.2 04/17/2018 1202   LDLCALC 140 (H) 04/17/2018 1202   LDLDIRECT 162.0 09/06/2016 1234    Physical Exam:    VS:  BP 110/82 (BP Location: Left Arm, Patient Position: Sitting, Cuff Size: Normal)   Pulse 92   Ht 5\' 11"  (1.803 m)   Wt 214 lb (97.1 kg)   SpO2 91%   BMI 29.85 kg/m     Wt Readings from Last 3 Encounters:  10/22/20 214 lb (97.1 kg)  07/27/20 213 lb 12 oz (97 kg)  01/15/20 216 lb 6 oz (98.1 kg)     GEN:  Well nourished, well developed in no acute distress HEENT: Normal NECK: No JVD; No carotid bruits LYMPHATICS: No lymphadenopathy CARDIAC: RRR, no murmurs, rubs, gallops RESPIRATORY:  Clear to auscultation without rales, wheezing or rhonchi  ABDOMEN: Soft, non-tender, non-distended MUSCULOSKELETAL:  No edema; No deformity  SKIN: Warm and dry NEUROLOGIC:  Alert and oriented x 3 PSYCHIATRIC:  Normal affect   ASSESSMENT:    1. Chronic systolic CHF (congestive heart failure) (HCC)   2. Dilated cardiomyopathy (HCC)   3. Essential hypertension    4. SOB (shortness of breath)   5. Sleep-disordered breathing   6. Medication management   7. Screening for hyperlipidemia    PLAN:    In order of problems listed above:  HFrEF NICM Moderate pulmonary HTN Echo 10/2019 showed EF<20%. Cath showed nonobstructive CAD, CM felt out of proportion to underlying coronary disease, medical management recommended. Patient reports worsening shortness of breath. He takes lasix 80mg  daily and has taken extra 40mg  lasix sporadically during the last 2 weeks, unsure if this helped. He does not feel volume overloaded, also does not appear volume up on exam. I will check BNP and BMET. He previously stopped Toprol for suspected low BP/lightheadedness. I will restart lower dose Toprol 12.5mg  daily. Continue Losartan 25mg  daily, unsure BP will tolerate Entresto. Will start Jardiance. Continue GDMT at follow-up as tolerated. Can repeat echo at follow-up. Will refer to EP for defibrillator consideration. If BNP is elevated will  recommend lasix 80mg  BID for 3 days then back down to 80mg  daily. BMET in a week.  HTN BP 110/82. Restart Toprol 12.5mg  daily. Continue Losartan and start Jardiance.   HLD Last LDL 140 03/2018 with the goal less than 70. Patient said he did not start Lipitor 40mg  for possible side effects. Re-check LFTs and cholesterol today. Says he has lost some weight since the last check. Based on numbers he may consider starting statin.   Possible sleep apnea Patient reports waking up at night short of breath. Sisters reports he has episodes of slowed or no breathing. Will refer to pulmonology for sleep study.   Disposition: Follow up in 2 month(s) with MD/APP    Signed, Laquisha Northcraft , PA-C  10/22/2020 12:38 PM    [facility]

## 2020-10-22 ENCOUNTER — Other Ambulatory Visit: Payer: Self-pay

## 2020-10-22 ENCOUNTER — Ambulatory Visit (INDEPENDENT_AMBULATORY_CARE_PROVIDER_SITE_OTHER): Payer: Self-pay | Admitting: Medical

## 2020-10-22 ENCOUNTER — Encounter: Payer: Self-pay | Admitting: Medical

## 2020-10-22 VITALS — BP 110/82 | HR 92 | Ht 71.0 in | Wt 214.0 lb

## 2020-10-22 DIAGNOSIS — Z79899 Other long term (current) drug therapy: Secondary | ICD-10-CM

## 2020-10-22 DIAGNOSIS — G4730 Sleep apnea, unspecified: Secondary | ICD-10-CM

## 2020-10-22 DIAGNOSIS — Z13220 Encounter for screening for lipoid disorders: Secondary | ICD-10-CM

## 2020-10-22 DIAGNOSIS — I420 Dilated cardiomyopathy: Secondary | ICD-10-CM

## 2020-10-22 DIAGNOSIS — R0602 Shortness of breath: Secondary | ICD-10-CM

## 2020-10-22 DIAGNOSIS — I10 Essential (primary) hypertension: Secondary | ICD-10-CM

## 2020-10-22 DIAGNOSIS — I5022 Chronic systolic (congestive) heart failure: Secondary | ICD-10-CM

## 2020-10-22 MED ORDER — EMPAGLIFLOZIN 10 MG PO TABS: ORAL_TABLET | ORAL | 6 refills | Status: DC

## 2020-10-22 MED ORDER — METOPROLOL SUCCINATE ER 25 MG PO TB24: 12.5000 mg | ORAL_TABLET | Freq: Every day | ORAL | Status: DC

## 2020-10-22 NOTE — Patient Instructions (Addendum)
Medication Instructions:  - Your physician has recommended you make the following change in your medication:   1) RESTART Toprol (metoprolol succinate) 25 mg: - take 0.5 tablet (12.5 mg) by mouth once daily   2) START Jardiance 10 mg: - take 1 tablet by mouth once daily   Samples Given: Jardiance 10 mg Lot: [id number] Exp: May 2024 # 2 boxes  *If you need a refill on your cardiac medications before your next appointment, please call your pharmacy*   Lab Work: - Your physician recommends that you have lab work today: BNP/ CMET/ Lipid   - Your physician recommends that you return for lab work in: 1 week BMP   If you have labs (blood work) drawn today and your tests are completely normal, you will receive your results only by: MyChart Message (if you have MyChart) OR A paper copy in the mail If you have any lab test that is abnormal or we need to change your treatment, we will call you to review the results.   Testing/Procedures: 1) You have been referred to : Cardiac Electrophysiology- Dr. Graciela Husbands Dr. Lalla Brothers for evaluation of your low heart pumping function/ consideration of a defibrillator implant  2)You have been referred to: [facility]- for a consultation for a sleep study  - Their office will call you directly to schedule. If you do not hear from them within 1.5- 2 weeks, then please call them to schedule at [phone].    Follow-Up: At [facility], you and your health needs are our priority.  As part of our continuing mission to provide you with exceptional heart care, we have created designated Provider Care Teams.  These Care Teams include your primary Cardiologist (physician) and Advanced Practice Providers (APPs -  Physician Assistants and Nurse Practitioners) who all work together to provide you with the care you need, when you need it.  We recommend signing up for the patient portal called "MyChart".  Sign up information is provided on this After Visit  Summary.  MyChart is used to connect with patients for Virtual Visits (Telemedicine).  Patients are able to view lab/test results, encounter notes, upcoming appointments, etc.  Non-urgent messages can be sent to your provider as well.   To learn more about what you can do with MyChart, go to [email or web address].    Your next appointment:   3 month(s)  The format for your next appointment:   In Person  Provider:   You may see Julien Nordmann, MD or one of the following Advanced Practice Providers on your designated Care Team:   Nicolasa Ducking, NP Eula Listen, PA-C Marisue Ivan, PA-C Cadence Fransico Michael, [place]   Other Instructions  JARDIANCE (Empagliflozin) Tablets What is this medication? EMPAGLIFLOZIN (EM pa gli FLOE zin) treats type 2 diabetes. It works by helping your kidneys remove sugar (glucose) from your blood through the urine, which decreases your blood sugar. It can also be used to lower the risk of heart attack, stroke, and hospitalization for heart failure in people with type 2 diabetes. Changes to diet and exercise are often combined with this medication. This medicine may be used for other purposes; ask your health care provider or pharmacist if you have questions. COMMON BRAND NAME(S): Jardiance What should I tell my care team before I take this medication? They need to know if you have any of these conditions: Dehydration Diabetic ketoacidosis Diet low in salt Eating less due to illness, surgery, dieting, or any other reason Having surgery High cholesterol  High levels of potassium in the blood History of pancreatitis or pancreas problems History of yeast infection of the penis or vagina If you often drink alcohol Infections in the bladder, kidneys, or urinary tract Kidney disease Liver disease Low blood pressure On hemodialysis Problems urinating Type 1 diabetes Uncircumcised male An unusual or allergic reaction to empagliflozin, other medications, foods,  dyes, or preservatives Pregnant or trying to get pregnant Breast-feeding How should I use this medication? Take this medication by mouth with water. Take it as directed on the prescription label at the same time every day. You may take it with or without food. Keep taking it unless your care team tells you to stop. A special MedGuide will be given to you by the pharmacist with each prescription and refill. Be sure to read this information carefully each time. Talk to your care team about the use of this medication in children. Special care may be needed. Overdosage: If you think you have taken too much of this medicine contact a poison control center or emergency room at once. NOTE: This medicine is only for you. Do not share this medicine with others. What if I miss a dose? If you miss a dose, take it as soon as you can. If it is almost time for your next dose, take only that dose. Do not take double or extra doses. What may interact with this medication? Alcohol Diuretics Insulin This list may not describe all possible interactions. Give your health care provider a list of all the medicines, herbs, non-prescription drugs, or dietary supplements you use. Also tell them if you smoke, drink alcohol, or use illegal drugs. Some items may interact with your medicine. What should I watch for while using this medication? Visit your care team for regular checks on your progress. Tell your care team if your symptoms do not start to get better or if they get worse. This medication can cause a serious condition in which there is too much acid in the blood. If you develop nausea, vomiting, stomach pain, unusual tiredness, or breathing problems, stop taking this medication and call your care team right away. If possible, use a ketone dipstick to check for ketones in your urine. Check with your care team if you have severe diarrhea, nausea, and vomiting, or if you sweat a lot. The loss of too much body fluid  may make it dangerous for you to take this medication. A test called the HbA1C (A1C) will be monitored. This is a simple blood test. It measures your blood sugar control over the last 2 to 3 months. You will receive this test every 3 to 6 months. Learn how to check your blood sugar. Learn the symptoms of low and high blood sugar and how to manage them. Always carry a quick-source of sugar with you in case you have symptoms of low blood sugar. Examples include hard sugar candy or glucose tablets. Make sure others know that you can choke if you eat or drink when you develop serious symptoms of low blood sugar, such as seizures or unconsciousness. Get medical help at once. Tell your care team if you have high blood sugar. You might need to change the dose of your medication. If you are sick or exercising more than usual, you may need to change the dose of your medication. What side effects may I notice from receiving this medication? Side effects that you should report to your care team as soon as possible: Allergic reactions-skin rash, itching,  hives, swelling of the face, lips, tongue, or throat Dehydration-increased thirst, dry mouth, feeling faint or lightheaded, headache, dark yellow or brown urine Diabetic ketoacidosis (DKA)-increased thirst or amount of urine, dry mouth, fatigue, fruity odor to breath, trouble breathing, stomach pain, nausea, vomiting Genital yeast infection-redness, swelling, pain, or itchiness, odor, thick or lumpy discharge New pain or tenderness, change in skin color, sores or ulcers, infection of the leg or foot Infection or redness, swelling, tenderness, or pain in the genitals, or area from the genitals to the back of the rectum Urinary tract infection (UTI)-burning when passing urine, passing frequent small amounts of urine, bloody or cloudy urine, pain in the lower back or sides This list may not describe all possible side effects. Call your doctor for medical advice about  side effects. You may report side effects to FDA at 1-800-FDA-1088. Where should I keep my medication? Keep out of the reach of children and pets. Store at room temperature between 20 and 25 degrees C (68 and 77 degrees F). Get rid of any unused medication after the expiration date. To get rid of medications that are no longer needed or have expired: Take the medication to a medication take-back program. Check with your pharmacy or law enforcement to find a location. If you cannot return the medication, check the label or package insert to see if the medication should be thrown out in the garbage or flushed down the toilet. If you are not sure, ask your care team. If it is safe to put it in the trash, take the medication out of the container. Mix the medication with cat litter, dirt, coffee grounds, or other unwanted substance. Seal the mixture in a bag or container. Put it in the trash. NOTE: This sheet is a summary. It may not cover all possible information. If you have questions about this medicine, talk to your doctor, pharmacist, or health care provider.  2022 Elsevier/Gold Standard (2020-03-02 14:18:18)

## 2020-10-23 LAB — LIPID PANEL
Chol/HDL Ratio: 8 ratio — ABNORMAL HIGH (ref 0.0–5.0)
Cholesterol, Total: 199 mg/dL (ref 100–199)
HDL: 25 mg/dL — ABNORMAL LOW (ref >39)
LDL Chol Calc (NIH): 156 mg/dL — ABNORMAL HIGH (ref 0–99)
Triglycerides: 96 mg/dL (ref 0–149)
VLDL Cholesterol Cal: 18 mg/dL (ref 5–40)

## 2020-10-23 LAB — BRAIN NATRIURETIC PEPTIDE: BNP: 217.1 pg/mL — ABNORMAL HIGH (ref 0.0–100.0)

## 2020-10-23 LAB — COMPREHENSIVE METABOLIC PANEL
ALT: 19 IU/L (ref 0–44)
AST: 23 IU/L (ref 0–40)
Albumin/Globulin Ratio: 1.3 (ref 1.2–2.2)
Albumin: 4.4 g/dL (ref 4.0–5.0)
Alkaline Phosphatase: 70 IU/L (ref 44–121)
BUN/Creatinine Ratio: 14 (ref 9–20)
BUN: 21 mg/dL (ref 6–24)
Bilirubin Total: 1.2 mg/dL (ref 0.0–1.2)
CO2: 23 mmol/L (ref 20–29)
Calcium: 9.5 mg/dL (ref 8.7–10.2)
Chloride: 103 mmol/L (ref 96–106)
Creatinine, Ser: 1.47 mg/dL — ABNORMAL HIGH (ref 0.76–1.27)
Globulin, Total: 3.3 g/dL (ref 1.5–4.5)
Glucose: 98 mg/dL (ref 70–99)
Potassium: 4.6 mmol/L (ref 3.5–5.2)
Sodium: 140 mmol/L (ref 134–144)
Total Protein: 7.7 g/dL (ref 6.0–8.5)
eGFR: 58 mL/min/{1.73_m2} — ABNORMAL LOW (ref >59)

## 2020-10-26 ENCOUNTER — Telehealth: Payer: Self-pay | Admitting: *Deleted

## 2020-10-26 NOTE — Telephone Encounter (Signed)
-----   Message from Cadence David Stall, PA-C sent at 10/26/2020 12:56 PM EDT ----- BNP mildly elevated. Increase lasix for 3 days to lasix 80mg  BID then back down to original dose lasix 80mg  daily

## 2020-10-26 NOTE — Telephone Encounter (Signed)
Pt notified of BNP result and Cadence's recc.  Pt voiced understanding.  Pt will Incr lasix to 80 mg BID x 3 days THEN resume original dose Lasix 80 mg daily.   Pt also notified of previous results:  Cadence David Stall, PA-C  10/23/2020  2:31 PM EDT     Labs showed stable kidney function.  Awaiting BNP results still to help determine need for increase in lasix.  Lipid profile showed LDL 156. I would recommend start Lipitor 40mg  daily   Pt states that he already has an Rx for Atorvastatin 40 mg daily.  Pt will start taking.  No further questions at this time.

## 2020-10-29 ENCOUNTER — Other Ambulatory Visit: Payer: Self-pay

## 2020-10-29 ENCOUNTER — Other Ambulatory Visit (INDEPENDENT_AMBULATORY_CARE_PROVIDER_SITE_OTHER): Payer: Self-pay

## 2020-10-29 DIAGNOSIS — I5022 Chronic systolic (congestive) heart failure: Secondary | ICD-10-CM

## 2020-10-29 DIAGNOSIS — Z79899 Other long term (current) drug therapy: Secondary | ICD-10-CM

## 2020-10-29 DIAGNOSIS — I420 Dilated cardiomyopathy: Secondary | ICD-10-CM

## 2020-10-30 LAB — BASIC METABOLIC PANEL
BUN/Creatinine Ratio: 15 (ref 9–20)
BUN: 25 mg/dL — ABNORMAL HIGH (ref 6–24)
CO2: 22 mmol/L (ref 20–29)
Calcium: 9.4 mg/dL (ref 8.7–10.2)
Chloride: 100 mmol/L (ref 96–106)
Creatinine, Ser: 1.64 mg/dL — ABNORMAL HIGH (ref 0.76–1.27)
Glucose: 119 mg/dL — ABNORMAL HIGH (ref 70–99)
Potassium: 4.2 mmol/L (ref 3.5–5.2)
Sodium: 139 mmol/L (ref 134–144)
eGFR: 51 mL/min/{1.73_m2} — ABNORMAL LOW (ref >59)

## 2020-11-02 ENCOUNTER — Telehealth: Payer: Self-pay | Admitting: Medical

## 2020-11-02 DIAGNOSIS — Z79899 Other long term (current) drug therapy: Secondary | ICD-10-CM

## 2020-11-02 DIAGNOSIS — I5022 Chronic systolic (congestive) heart failure: Secondary | ICD-10-CM

## 2020-11-02 DIAGNOSIS — N289 Disorder of kidney and ureter, unspecified: Secondary | ICD-10-CM

## 2020-11-02 NOTE — Telephone Encounter (Signed)
Beatrice Lecher, PA-C  10/30/2020 12:13 PM EDT     Covering for Flavia Shipper, NP while he is on vacation.  Looks like lab ordered by C. Furth, PA-C but resulted to C. Brion Aliment, NP. Furosemide was increased to 80 twice daily x 3 days on 10/26/20 >> then back to 80 once daily. Creatinine increased some.  K+ normal.   PLAN:  -Continue current medications/treatment plan. -Repeat BMET 1 week (order under Flavia Shipper, NP) Tereso Newcomer, PA-C    10/30/2020 12:02 PM

## 2020-11-02 NOTE — Telephone Encounter (Signed)
I spoke with the patient regarding his lab results. Confirmed with his that he did increase lasix to 80 mg BID x 3 days as instructed on 10/26/20 and has since resumed lasix 80 mg QD. Per the patient, he did not have much urine out put on the 3rd day of the BID dosing.  I have advised the patient of Tereso Newcomer, PA's recommendations to: 1) Continue with his current medications/ treatment plan 2) Repeat a BMP in 1 week  The patient has not seen Ward Givens, NP. He has been following with Cadence, PA/ [name].  Will order repeat BMP under Cadence Furth, PA to decrease some confusion and notify Cadence of the above.   The patient voices understanding of the above recommendations and is agreeable.  Lab appt scheduled for 11/12/20 at 10:00 am.

## 2020-11-12 ENCOUNTER — Other Ambulatory Visit: Payer: Self-pay

## 2020-11-13 ENCOUNTER — Telehealth: Payer: Self-pay | Admitting: Internal Medicine

## 2020-11-13 MED ORDER — ALBUTEROL SULFATE HFA 108 (90 BASE) MCG/ACT IN AERS: INHALATION_SPRAY | RESPIRATORY_TRACT | 0 refills | Status: DC

## 2020-11-13 NOTE — Telephone Encounter (Signed)
Pt needs a refill on albuterol (VENTOLIN HFA) 108 (90 Base) MCG/ACT inhaler sent to [facility] in [place]  Pt stated that he didn't want to schedule an appt since he is followed by [name]

## 2020-11-13 NOTE — Telephone Encounter (Signed)
Rx sent electronically.  

## 2020-11-13 NOTE — Telephone Encounter (Signed)
He has not been seen since May 2021. Dr Mariah Milling is cardiology, not Primary care. Will forward to Dr Alphonsus Sias for approval.

## 2020-11-17 ENCOUNTER — Other Ambulatory Visit: Payer: Self-pay | Admitting: Cardiovascular Disease

## 2020-11-18 ENCOUNTER — Institutional Professional Consult (permissible substitution): Payer: Self-pay | Admitting: Internal Medicine

## 2020-12-06 ENCOUNTER — Other Ambulatory Visit: Payer: Self-pay | Admitting: Internal Medicine

## 2020-12-09 ENCOUNTER — Other Ambulatory Visit: Payer: Self-pay

## 2020-12-09 ENCOUNTER — Encounter: Payer: Self-pay | Admitting: Family Medicine

## 2020-12-09 NOTE — Progress Notes (Signed)
This encounter was created in error - please disregard.

## 2020-12-15 ENCOUNTER — Ambulatory Visit (INDEPENDENT_AMBULATORY_CARE_PROVIDER_SITE_OTHER): Payer: Self-pay | Admitting: Internal Medicine

## 2020-12-15 ENCOUNTER — Ambulatory Visit (INDEPENDENT_AMBULATORY_CARE_PROVIDER_SITE_OTHER): Payer: Self-pay

## 2020-12-15 ENCOUNTER — Other Ambulatory Visit: Payer: Self-pay

## 2020-12-15 ENCOUNTER — Encounter: Payer: Self-pay | Admitting: Internal Medicine

## 2020-12-15 VITALS — BP 102/80 | HR 101 | Temp 97.0°F | Ht 71.0 in | Wt 211.0 lb

## 2020-12-15 DIAGNOSIS — R051 Acute cough: Secondary | ICD-10-CM

## 2020-12-15 DIAGNOSIS — I5022 Chronic systolic (congestive) heart failure: Secondary | ICD-10-CM

## 2020-12-15 DIAGNOSIS — R059 Cough, unspecified: Secondary | ICD-10-CM | POA: Insufficient documentation

## 2020-12-15 MED ORDER — ATORVASTATIN CALCIUM 40 MG PO TABS: 40.0000 mg | ORAL_TABLET | Freq: Every day | ORAL | 3 refills | Status: DC

## 2020-12-15 MED ORDER — AMOXICILLIN-POT CLAVULANATE 875-125 MG PO TABS: 1.0000 | ORAL_TABLET | Freq: Two times a day (BID) | ORAL | 0 refills | Status: DC

## 2020-12-15 NOTE — Progress Notes (Signed)
Subjective:    Patient ID: Joshua Cordova, male    DOB: August 02, 1970, 50 y.o.   MRN: [id number]  HPI Here due to cough This visit occurred during the SARS-CoV-2 public health emergency.  Safety protocols were in place, including screening questions prior to the visit, additional usage of staff PPE, and extensive cleaning of exam room while observing appropriate contact time as indicated for disinfecting solutions.   Reviewed his heart status Not happy with some visits Not sure why they were giving the jardiance (and too much money) Metoprolol caused SOB and he would awaken flushed (was taking at night)  3 weeks of SOB and then better Would use extra furosemide---if he heard popping like fluid Using the albuterol otherwise--might help temporarily Coughing with yellow sputum all the time. Even some "meat" and bloody stuff No fever  Current Outpatient Medications on File Prior to Visit  Medication Sig Dispense Refill   albuterol (VENTOLIN HFA) 108 (90 Base) MCG/ACT inhaler TAKE 2 PUFFS BY MOUTH EVERY 6 HOURS AS NEEDED FOR WHEEZE OR SHORTNESS OF BREATH 18 each 0   furosemide (LASIX) 80 MG tablet Take 1 tablet (80 mg total) by mouth daily. 90 tablet 3   losartan (COZAAR) 25 MG tablet TAKE ONE TABLET BY MOUTH DAILY 90 tablet 0   No current facility-administered medications on file prior to visit.    No Known Allergies  Past Medical History:  Diagnosis Date   Chronic systolic heart failure (HCC)    Hypertension    NICM (nonischemic cardiomyopathy) (HCC)     Past Surgical History:  Procedure Laterality Date   CARDIAC CATHETERIZATION     RIGHT/LEFT HEART CATH AND CORONARY ANGIOGRAPHY N/A 11/01/2019   Procedure: RIGHT/LEFT HEART CATH AND CORONARY ANGIOGRAPHY;  Surgeon: [name], MD;  Location: [facility] INVASIVE CV LAB;  Service: Cardiovascular;  Laterality: N/A;    Family History  Problem Relation Age of Onset   Diabetes Mother    Hypertension Father    Heart disease Father     Diabetes Maternal Grandfather    Heart disease Other     Social History   Socioeconomic History   Marital status: Single    Spouse name: Not on file   Number of children: Not on file   Years of education: Not on file   Highest education level: Not on file  Occupational History   Occupation: Delivering auto parts    Comment: [employer]   Occupation:    Tobacco Use   Smoking status: Former    Packs/day: 1.00    Types: Cigarettes    Quit date: 2018    Years since quitting: 4.9   Smokeless tobacco: Never  Vaping Use   Vaping Use: Never used  Substance and Sexual Activity   Alcohol use: No   Drug use: Never   Sexual activity: Not on file  Other Topics Concern   Not on file  Social History Narrative   Not on file   Social Determinants of Health   Financial Resource Strain: Not on file  Food Insecurity: Not on file  Transportation Needs: Not on file  Physical Activity: Not on file  Stress: Not on file  Social Connections: Not on file  Intimate Partner Violence: Not on file   Review of Systems Weight is actually down some No chest pain Sometimes felt hot---no sweats or chills    Objective:   Physical Exam Constitutional:      Appearance: He is well-developed.  Cardiovascular:  Rate and Rhythm: Normal rate and regular rhythm.     Heart sounds: No murmur heard.   No gallop.  Pulmonary:     Effort: Pulmonary effort is normal.     Breath sounds: Normal breath sounds. No wheezing or rales.     Comments: Some dullness at left base Musculoskeletal:     Cervical back: Neck supple.  Lymphadenopathy:     Cervical: No cervical adenopathy.  Neurological:     Mental Status: He is alert.           Assessment & Plan:

## 2020-12-15 NOTE — Patient Instructions (Signed)
Let me know if your cough and infection are not better by the end of the week (on the antibiotic). Try 1/2 of the metoprolol again in the morning (and the losartan at night). If it brings you down again--just stop it.

## 2020-12-15 NOTE — Assessment & Plan Note (Addendum)
With production of purulent sputum Will check CXR to see if pneumonia vs mostly bronchial (3 weeks of symptoms) CXR shows large heart but less pulmonary congestion No pneumonia  Will give augmentin

## 2020-12-15 NOTE — Assessment & Plan Note (Signed)
Seems to be compensated on the losartan and furosemide Discussed trying to take 1/2 metoprolol in the AM

## 2020-12-22 ENCOUNTER — Telehealth: Payer: Self-pay | Admitting: Internal Medicine

## 2020-12-22 MED ORDER — DOXYCYCLINE HYCLATE 100 MG PO TABS: 100.0000 mg | ORAL_TABLET | Freq: Two times a day (BID) | ORAL | 0 refills | Status: DC

## 2020-12-22 MED ORDER — PREDNISONE 20 MG PO TABS: 40.0000 mg | ORAL_TABLET | Freq: Every day | ORAL | 0 refills | Status: DC

## 2020-12-22 NOTE — Telephone Encounter (Signed)
Please let him know I sent a different antibiotic for him to try (stop the other one) and 3 days or prednisone He may need to come back in if not improving

## 2020-12-22 NOTE — Addendum Note (Signed)
Addended by: Tillman Abide I on: 12/22/2020 12:54 PM   Modules accepted: Orders

## 2020-12-22 NOTE — Telephone Encounter (Signed)
Pt called stating that he had an OV on 12/15/20. Pt states that Dr Alphonsus Sias stated that he wasn't feeling any better to call and let him know. Pt states he is not feeling any better. Please advise.

## 2020-12-22 NOTE — Telephone Encounter (Signed)
Spoke to pt. He was appreciative for the new meds.

## 2021-01-15 ENCOUNTER — Telehealth: Payer: Self-pay

## 2021-01-15 NOTE — Telephone Encounter (Signed)
Patient has been having SOB with history of CHF. Started about a week ago and has increased. A lot in the last 24 hrs. He was not able to sleep due to breathing. I have advised patient that he go to ED for evaluation. Patient agreed and will go now.

## 2021-01-17 ENCOUNTER — Other Ambulatory Visit: Payer: Self-pay

## 2021-01-17 ENCOUNTER — Encounter: Payer: Self-pay | Admitting: Emergency Medicine

## 2021-01-17 ENCOUNTER — Emergency Department: Payer: Medicaid Other

## 2021-01-17 ENCOUNTER — Inpatient Hospital Stay
Admission: EM | Admit: 2021-01-17 | Discharge: 2021-01-27 | DRG: 175 | Disposition: A | Discharge status: Home / Self Care | Payer: Medicaid Other | Attending: Internal Medicine | Admitting: Internal Medicine

## 2021-01-17 DIAGNOSIS — Z8249 Family history of ischemic heart disease and other diseases of the circulatory system: Secondary | ICD-10-CM

## 2021-01-17 DIAGNOSIS — I5082 Biventricular heart failure: Secondary | ICD-10-CM | POA: Diagnosis present

## 2021-01-17 DIAGNOSIS — I5043 Acute on chronic combined systolic (congestive) and diastolic (congestive) heart failure: Secondary | ICD-10-CM | POA: Diagnosis present

## 2021-01-17 DIAGNOSIS — I10 Essential (primary) hypertension: Secondary | ICD-10-CM

## 2021-01-17 DIAGNOSIS — K8020 Calculus of gallbladder without cholecystitis without obstruction: Secondary | ICD-10-CM | POA: Diagnosis present

## 2021-01-17 DIAGNOSIS — J4520 Mild intermittent asthma, uncomplicated: Secondary | ICD-10-CM | POA: Diagnosis present

## 2021-01-17 DIAGNOSIS — E669 Obesity, unspecified: Secondary | ICD-10-CM | POA: Diagnosis present

## 2021-01-17 DIAGNOSIS — I272 Pulmonary hypertension, unspecified: Secondary | ICD-10-CM | POA: Diagnosis present

## 2021-01-17 DIAGNOSIS — E785 Hyperlipidemia, unspecified: Secondary | ICD-10-CM | POA: Diagnosis present

## 2021-01-17 DIAGNOSIS — I420 Dilated cardiomyopathy: Secondary | ICD-10-CM | POA: Diagnosis present

## 2021-01-17 DIAGNOSIS — I2510 Atherosclerotic heart disease of native coronary artery without angina pectoris: Secondary | ICD-10-CM | POA: Diagnosis present

## 2021-01-17 DIAGNOSIS — I2699 Other pulmonary embolism without acute cor pulmonale: Secondary | ICD-10-CM | POA: Diagnosis not present

## 2021-01-17 DIAGNOSIS — R778 Other specified abnormalities of plasma proteins: Secondary | ICD-10-CM | POA: Diagnosis not present

## 2021-01-17 DIAGNOSIS — Z87891 Personal history of nicotine dependence: Secondary | ICD-10-CM

## 2021-01-17 DIAGNOSIS — E1122 Type 2 diabetes mellitus with diabetic chronic kidney disease: Secondary | ICD-10-CM | POA: Diagnosis present

## 2021-01-17 DIAGNOSIS — I130 Hypertensive heart and chronic kidney disease with heart failure and stage 1 through stage 4 chronic kidney disease, or unspecified chronic kidney disease: Secondary | ICD-10-CM | POA: Diagnosis present

## 2021-01-17 DIAGNOSIS — R590 Localized enlarged lymph nodes: Secondary | ICD-10-CM | POA: Diagnosis not present

## 2021-01-17 DIAGNOSIS — I2693 Single subsegmental pulmonary embolism without acute cor pulmonale: Secondary | ICD-10-CM | POA: Diagnosis present

## 2021-01-17 DIAGNOSIS — E782 Mixed hyperlipidemia: Secondary | ICD-10-CM | POA: Diagnosis not present

## 2021-01-17 DIAGNOSIS — J9601 Acute respiratory failure with hypoxia: Secondary | ICD-10-CM | POA: Diagnosis present

## 2021-01-17 DIAGNOSIS — I959 Hypotension, unspecified: Secondary | ICD-10-CM | POA: Diagnosis present

## 2021-01-17 DIAGNOSIS — I428 Other cardiomyopathies: Secondary | ICD-10-CM | POA: Diagnosis present

## 2021-01-17 DIAGNOSIS — N183 Chronic kidney disease, stage 3 unspecified: Secondary | ICD-10-CM | POA: Diagnosis not present

## 2021-01-17 DIAGNOSIS — Z20822 Contact with and (suspected) exposure to covid-19: Secondary | ICD-10-CM | POA: Diagnosis present

## 2021-01-17 DIAGNOSIS — N179 Acute kidney failure, unspecified: Secondary | ICD-10-CM | POA: Diagnosis not present

## 2021-01-17 DIAGNOSIS — K921 Melena: Secondary | ICD-10-CM | POA: Diagnosis present

## 2021-01-17 DIAGNOSIS — I5022 Chronic systolic (congestive) heart failure: Secondary | ICD-10-CM

## 2021-01-17 DIAGNOSIS — Z79899 Other long term (current) drug therapy: Secondary | ICD-10-CM | POA: Diagnosis not present

## 2021-01-17 DIAGNOSIS — N1831 Chronic kidney disease, stage 3a: Secondary | ICD-10-CM | POA: Diagnosis present

## 2021-01-17 DIAGNOSIS — E1169 Type 2 diabetes mellitus with other specified complication: Secondary | ICD-10-CM

## 2021-01-17 DIAGNOSIS — J45909 Unspecified asthma, uncomplicated: Secondary | ICD-10-CM | POA: Diagnosis present

## 2021-01-17 DIAGNOSIS — M7989 Other specified soft tissue disorders: Secondary | ICD-10-CM

## 2021-01-17 DIAGNOSIS — R0602 Shortness of breath: Secondary | ICD-10-CM | POA: Diagnosis present

## 2021-01-17 DIAGNOSIS — Z6828 Body mass index (BMI) 28.0-28.9, adult: Secondary | ICD-10-CM | POA: Diagnosis not present

## 2021-01-17 DIAGNOSIS — Z833 Family history of diabetes mellitus: Secondary | ICD-10-CM | POA: Diagnosis not present

## 2021-01-17 DIAGNOSIS — E1129 Type 2 diabetes mellitus with other diabetic kidney complication: Secondary | ICD-10-CM | POA: Diagnosis present

## 2021-01-17 DIAGNOSIS — I5023 Acute on chronic systolic (congestive) heart failure: Secondary | ICD-10-CM | POA: Diagnosis not present

## 2021-01-17 DIAGNOSIS — I2609 Other pulmonary embolism with acute cor pulmonale: Secondary | ICD-10-CM | POA: Diagnosis not present

## 2021-01-17 DIAGNOSIS — I459 Conduction disorder, unspecified: Secondary | ICD-10-CM | POA: Diagnosis present

## 2021-01-17 LAB — HIV ANTIBODY (ROUTINE TESTING W REFLEX): HIV Screen 4th Generation wRfx: NONREACTIVE

## 2021-01-17 LAB — PROTIME-INR
INR: 1.1 (ref 0.8–1.2)
Prothrombin Time: 14.6 seconds (ref 11.4–15.2)

## 2021-01-17 LAB — CBC
HCT: 42.5 % (ref 39.0–52.0)
Hemoglobin: 14.3 g/dL (ref 13.0–17.0)
MCH: 30.8 pg (ref 26.0–34.0)
MCHC: 33.6 g/dL (ref 30.0–36.0)
MCV: 91.4 fL (ref 80.0–100.0)
Platelets: 230 10*3/uL (ref 150–400)
RBC: 4.65 MIL/uL (ref 4.22–5.81)
RDW: 14.5 % (ref 11.5–15.5)
WBC: 8.1 10*3/uL (ref 4.0–10.5)
nRBC: 0 % (ref 0.0–0.2)

## 2021-01-17 LAB — HEPATIC FUNCTION PANEL
ALT: 20 U/L (ref 0–44)
AST: 27 U/L (ref 15–41)
Albumin: 3.6 g/dL (ref 3.5–5.0)
Alkaline Phosphatase: 89 U/L (ref 38–126)
Bilirubin, Direct: 0.3 mg/dL — ABNORMAL HIGH (ref 0.0–0.2)
Indirect Bilirubin: 1.1 mg/dL — ABNORMAL HIGH (ref 0.3–0.9)
Total Bilirubin: 1.4 mg/dL — ABNORMAL HIGH (ref 0.3–1.2)
Total Protein: 7.8 g/dL (ref 6.5–8.1)

## 2021-01-17 LAB — BASIC METABOLIC PANEL
Anion gap: 9 (ref 5–15)
BUN: 34 mg/dL — ABNORMAL HIGH (ref 6–20)
CO2: 20 mmol/L — ABNORMAL LOW (ref 22–32)
Calcium: 8.6 mg/dL — ABNORMAL LOW (ref 8.9–10.3)
Chloride: 103 mmol/L (ref 98–111)
Creatinine, Ser: 1.57 mg/dL — ABNORMAL HIGH (ref 0.61–1.24)
GFR, Estimated: 54 mL/min — ABNORMAL LOW (ref >60)
Glucose, Bld: 150 mg/dL — ABNORMAL HIGH (ref 70–99)
Potassium: 3.5 mmol/L (ref 3.5–5.1)
Sodium: 132 mmol/L — ABNORMAL LOW (ref 135–145)

## 2021-01-17 LAB — RESP PANEL BY RT-PCR (FLU A&B, COVID) ARPGX2
Influenza A by PCR: NEGATIVE
Influenza B by PCR: NEGATIVE
SARS Coronavirus 2 by RT PCR: NEGATIVE

## 2021-01-17 LAB — HEPARIN LEVEL (UNFRACTIONATED)
Heparin Unfractionated: 0.5 IU/mL (ref 0.30–0.70)
Heparin Unfractionated: 0.37 IU/mL (ref 0.30–0.70)

## 2021-01-17 LAB — TROPONIN I (HIGH SENSITIVITY)
Troponin I (High Sensitivity): 47 ng/L — ABNORMAL HIGH (ref <18)
Troponin I (High Sensitivity): 47 ng/L — ABNORMAL HIGH (ref <18)

## 2021-01-17 LAB — APTT: aPTT: 35 seconds (ref 24–36)

## 2021-01-17 LAB — HEMOGLOBIN A1C
Hgb A1c MFr Bld: 6.8 % — ABNORMAL HIGH (ref 4.8–5.6)
Mean Plasma Glucose: 148.46 mg/dL

## 2021-01-17 LAB — LIPASE, BLOOD: Lipase: 45 U/L (ref 11–51)

## 2021-01-17 LAB — ANTITHROMBIN III: AntiThromb III Func: 77 % (ref 75–120)

## 2021-01-17 LAB — D-DIMER, QUANTITATIVE: D-Dimer, Quant: 1.54 ug/mL-FEU — ABNORMAL HIGH (ref 0.00–0.50)

## 2021-01-17 LAB — PROCALCITONIN: Procalcitonin: <0.1 ng/mL

## 2021-01-17 LAB — BRAIN NATRIURETIC PEPTIDE: B Natriuretic Peptide: 761.9 pg/mL — ABNORMAL HIGH (ref 0.0–100.0)

## 2021-01-17 MED ORDER — FUROSEMIDE 10 MG/ML IJ SOLN
40.0000 mg | Freq: Two times a day (BID) | INTRAMUSCULAR | Status: DC
  Administered 2021-01-17 – 2021-01-18 (×2): 40 mg via INTRAVENOUS
  Filled 2021-01-17 (×2): qty 4

## 2021-01-17 MED ORDER — ALBUTEROL SULFATE HFA 108 (90 BASE) MCG/ACT IN AERS: 2.0000 | INHALATION_SPRAY | RESPIRATORY_TRACT | Status: DC | PRN

## 2021-01-17 MED ORDER — DM-GUAIFENESIN ER 30-600 MG PO TB12
1.0000 | ORAL_TABLET | Freq: Two times a day (BID) | ORAL | Status: DC | PRN
  Administered 2021-01-26: 1 via ORAL
  Filled 2021-01-17: qty 1

## 2021-01-17 MED ORDER — LOSARTAN POTASSIUM 50 MG PO TABS
25.0000 mg | ORAL_TABLET | Freq: Every day | ORAL | Status: DC
  Administered 2021-01-17: 25 mg via ORAL
  Filled 2021-01-17 (×2): qty 1

## 2021-01-17 MED ORDER — ACETAMINOPHEN 325 MG PO TABS
650.0000 mg | ORAL_TABLET | Freq: Four times a day (QID) | ORAL | Status: DC | PRN
  Administered 2021-01-19: 650 mg via ORAL
  Filled 2021-01-17: qty 2

## 2021-01-17 MED ORDER — HYDRALAZINE HCL 20 MG/ML IJ SOLN: 5.0000 mg | INTRAMUSCULAR | Status: DC | PRN

## 2021-01-17 MED ORDER — IOHEXOL 350 MG/ML SOLN
75.0000 mL | Freq: Once | INTRAVENOUS | Status: AC | PRN
Start: 2021-01-17 — End: 2021-01-17
  Administered 2021-01-17: 75 mL via INTRAVENOUS

## 2021-01-17 MED ORDER — FUROSEMIDE 10 MG/ML IJ SOLN: 40.0000 mg | Freq: Every day | INTRAMUSCULAR | Status: DC

## 2021-01-17 MED ORDER — ALBUTEROL SULFATE (2.5 MG/3ML) 0.083% IN NEBU: 2.5000 mg | INHALATION_SOLUTION | RESPIRATORY_TRACT | Status: DC | PRN

## 2021-01-17 MED ORDER — HEPARIN (PORCINE) 25000 UT/250ML-% IV SOLN
1600.0000 [IU]/h | INTRAVENOUS | Status: DC
  Administered 2021-01-17 – 2021-01-21 (×7): 1600 [IU]/h via INTRAVENOUS
  Filled 2021-01-17 (×7): qty 250

## 2021-01-17 MED ORDER — ONDANSETRON HCL 4 MG/2ML IJ SOLN: 4.0000 mg | Freq: Three times a day (TID) | INTRAMUSCULAR | Status: DC | PRN

## 2021-01-17 MED ORDER — HEPARIN BOLUS VIA INFUSION
6500.0000 [IU] | Freq: Once | INTRAVENOUS | Status: AC
  Administered 2021-01-17: 6500 [IU] via INTRAVENOUS
  Filled 2021-01-17: qty 6500

## 2021-01-17 MED ORDER — FUROSEMIDE 10 MG/ML IJ SOLN
40.0000 mg | Freq: Once | INTRAMUSCULAR | Status: AC
  Administered 2021-01-17: 40 mg via INTRAVENOUS
  Filled 2021-01-17: qty 4

## 2021-01-17 MED ORDER — METOPROLOL TARTRATE 25 MG PO TABS
12.5000 mg | ORAL_TABLET | Freq: Every day | ORAL | Status: DC
  Filled 2021-01-17: qty 1

## 2021-01-17 NOTE — Progress Notes (Signed)
ANTICOAGULATION CONSULT NOTE   Pharmacy Consult for heparin infusion Indication: pulmonary embolus  No Known Allergies  Patient Measurements: Height: 5\' 11"  (180.3 cm) Weight: 95.7 kg (211 lb) IBW/kg (Calculated) : 75.3 Heparin Dosing Weight: 94.1 kg  Vital Signs: Temp: 98.1 F (36.7 C) (01/01 0031) Temp Source: Oral (01/01 0031) BP: 106/89 (01/01 0530) Pulse Rate: 93 (01/01 0530)  Labs: Recent Labs    01/17/21 0041 01/17/21 0253  HGB 14.3  --   HCT 42.5  --   PLT 230  --   CREATININE 1.57*  --   TROPONINIHS 47* 47*    Estimated Creatinine Clearance: 67.2 mL/min (A) (by C-G formula based on SCr of 1.57 mg/dL (H)).   Medical History: Past Medical History:  Diagnosis Date   Chronic systolic heart failure (HCC)    Hypertension    NICM (nonischemic cardiomyopathy) (HCC)    Assessment: Pt is 51 yo male presenting to ED c/o SOB being treated for "left lower lobe segmental and subsegmental emboli, few suspected left upper lobe subsegmental emboli."  Goal of Therapy:  Heparin level 0.3-0.7 units/ml Monitor platelets by anticoagulation protocol: Yes   Plan:  Bolus 6500 units x 1 Start heparin infusion at 1600 units/hr Check HL in 6 hr after start of infusion CBC daily while on heparin  Renda Rolls, PharmD, [facility] 01/17/2021 6:01 AM

## 2021-01-17 NOTE — Progress Notes (Signed)
ANTICOAGULATION CONSULT NOTE   Pharmacy Consult for heparin infusion Indication: pulmonary embolus  No Known Allergies  Patient Measurements: Height: 5\' 11"  (180.3 cm) Weight: 95.7 kg (211 lb) IBW/kg (Calculated) : 75.3 Heparin Dosing Weight: 94.1 kg  Vital Signs: BP: 108/87 (01/01 0830) Pulse Rate: 88 (01/01 0830)  Labs: Recent Labs    01/17/21 0041 01/17/21 0253 01/17/21 1252  HGB 14.3  --   --   HCT 42.5  --   --   PLT 230  --   --   APTT  --  35  --   LABPROT  --  14.6  --   INR  --  1.1  --   HEPARINUNFRC  --   --  0.50  CREATININE 1.57*  --   --   TROPONINIHS 47* 47*  --      Estimated Creatinine Clearance: 67.2 mL/min (A) (by C-G formula based on SCr of 1.57 mg/dL (H)).   Medical History: Past Medical History:  Diagnosis Date   Chronic systolic heart failure (HCC)    Hypertension    NICM (nonischemic cardiomyopathy) (HCC)    Assessment: Pt is 51 yo male presenting to ED c/o SOB being treated for "left lower lobe segmental and subsegmental emboli, few suspected left upper lobe subsegmental emboli."  1/1 @1252  HL= 0.50     thera x1  Goal of Therapy:  Heparin level 0.3-0.7 units/ml Monitor platelets by anticoagulation protocol: Yes   Plan:  1/1 @1252  HL= 0.50     therapeutic x1 Continue heparin infusion at 1600 units/hr Check confirmatory HL in 6 hr  CBC daily while on heparin  54 PharmD Clinical Pharmacist 01/17/2021

## 2021-01-17 NOTE — ED Provider Notes (Signed)
[facility] Provider Note    Event Date/Time   First MD Initiated Contact with Patient 01/17/21 0234     (approximate)   History   Shortness of Breath   HPI  Joshua Cordova is a 51 y.o. male with history of systolic heart failure, hypertension, diabetes, hyperlipidemia, asthma who presents to the emergency department with complaints of worsening shortness of breath over the past week.  He states he has had a productive cough with white, yellow sputum production but no fevers or chills.  States he has noticed swelling in both of his legs but no calf tenderness.  He denies any history of PE or DVT.  States he has been using his albuterol inhaler without any relief.  He has also increased the amount of Lasix he is taking at home and reports he is peeing but not as much as he thought he would and his symptoms or not improving.  He is on Lasix 80 mg daily.  His cardiologist is [name].     Physical Exam   Triage Vital Signs: ED Triage Vitals  Enc Vitals Group     BP 01/17/21 0031 (!) 119/97     Pulse Rate 01/17/21 0031 (!) 107     Resp 01/17/21 0031 (!) 22     Temp 01/17/21 0031 98.1 F (36.7 C)     Temp Source 01/17/21 0031 Oral     SpO2 01/17/21 0031 96 %     Weight 01/17/21 0033 211 lb (95.7 kg)     Height 01/17/21 0033 5\' 11"  (1.803 m)     Head Circumference --      Peak Flow --      Pain Score 01/17/21 0033 0     Pain Loc --      Pain Edu? --      Excl. in GC? --     Most recent vital signs: Vitals:   01/17/21 0643 01/17/21 0700  BP: 114/88 114/86  Pulse: 89 96  Resp: 18 19  Temp:    SpO2: 95% 93%     General: Awake, no distress.  CV:  Good peripheral perfusion.  Regular and tachycardic. Resp:  Normal effort at rest but whenever patient has any minimal exertion he has increased shortness of breath, tachypnea and begin speaking truncated sentences.  He has slightly diminished aeration at his bases bilaterally but there is no wheezing,  rhonchi or rales.  He has not hypoxic. Abd:  No distention.  Soft and nontender.  No tenderness at McBurney's point.  Negative Murphy sign. Other:  Trace edema in bilateral lower extremities.  No calf tenderness or calf swelling.   ED Results / Procedures / Treatments   Labs (all labs ordered are listed, but only abnormal results are displayed) Labs Reviewed  BASIC METABOLIC PANEL - Abnormal; Notable for the following components:      Result Value   Sodium 132 (*)    CO2 20 (*)    Glucose, Bld 150 (*)    BUN 34 (*)    Creatinine, Ser 1.57 (*)    Calcium 8.6 (*)    GFR, Estimated 54 (*)    All other components within normal limits  BRAIN NATRIURETIC PEPTIDE - Abnormal; Notable for the following components:   B Natriuretic Peptide 761.9 (*)    All other components within normal limits  D-DIMER, QUANTITATIVE - Abnormal; Notable for the following components:   D-Dimer, Quant 1.54 (*)    All other  components within normal limits  HEPATIC FUNCTION PANEL - Abnormal; Notable for the following components:   Total Bilirubin 1.4 (*)    Bilirubin, Direct 0.3 (*)    Indirect Bilirubin 1.1 (*)    All other components within normal limits  TROPONIN I (HIGH SENSITIVITY) - Abnormal; Notable for the following components:   Troponin I (High Sensitivity) 47 (*)    All other components within normal limits  TROPONIN I (HIGH SENSITIVITY) - Abnormal; Notable for the following components:   Troponin I (High Sensitivity) 47 (*)    All other components within normal limits  RESP PANEL BY RT-PCR (FLU A&B, COVID) ARPGX2  CBC  PROCALCITONIN  LIPASE, BLOOD  APTT  PROTIME-INR  HEPARIN LEVEL (UNFRACTIONATED)  HEMOGLOBIN A1C  HIV ANTIBODY (ROUTINE TESTING W REFLEX)  ANTITHROMBIN III  PROTEIN C ACTIVITY  PROTEIN C, TOTAL  PROTEIN S ACTIVITY  PROTEIN S, TOTAL  LUPUS ANTICOAGULANT PANEL  BETA-2-GLYCOPROTEIN I ABS, IGG/M/A  HOMOCYSTEINE  FACTOR 5 LEIDEN  PROTHROMBIN GENE MUTATION  CARDIOLIPIN  ANTIBODIES, IGG, IGM, IGA     EKG   EKG Interpretation  Date/Time:  Sunday January 17 2021 00:37:46 EST Ventricular Rate:  107 PR Interval:  146 QRS Duration: 120 QT Interval:  362 QTC Calculation: 483 R Axis:   58 Text Interpretation: Sinus tachycardia Possible Left atrial enlargement Left ventricular hypertrophy with QRS widening ( Cornell product ) Nonspecific T wave abnormality Abnormal ECG When compared with ECG of 01-Nov-2019 04:55, PREVIOUS ECG IS PRESENT No significant change since last tracing Confirmed by Rochele Raring [phone]) on 01/17/2021 8:14:50 AM          RADIOLOGY Chest x-ray, CT chest ordered and reviewed by myself.  Chest x-ray is clear.  CT chest shows left upper and lower lobe pneumonia and signs of a CHF exacerbation with severe cardiomegaly, hepatic regurg and pericholecystic fluid.  Reviewed radiologist read.  Chest x-ray shows no acute abnormality.  CT scan shows small pericardial effusion, cardiomegaly, segmental and subsegmental emboli in the left lower and upper lobe with overall small clot burden.    PROCEDURES:  Critical Care performed:  YES     CRITICAL CARE Performed by: Rochele Raring   Total critical care time: 45 minutes  Critical care time was exclusive of separately billable procedures and treating other patients.  Critical care was necessary to treat or prevent imminent or life-threatening deterioration.  Critical care was time spent personally by me on the following activities: development of treatment plan with patient and/or surrogate as well as nursing, discussions with consultants, evaluation of patient's response to treatment, examination of patient, obtaining history from patient or surrogate, ordering and performing treatments and interventions, ordering and review of laboratory studies, ordering and review of radiographic studies, pulse oximetry and re-evaluation of patient's condition.   Marland Kitchen1-3 Lead EKG  Interpretation Performed by: Auria Mckinlay, Layla Maw, DO Authorized by: Aryaman Haliburton, Layla Maw, DO     Interpretation: normal     ECG rate:  75   ECG rate assessment: normal     Rhythm: sinus rhythm     Ectopy: none     Conduction: normal     MEDICATIONS ORDERED IN ED: Medications  heparin ADULT infusion 100 units/mL (25000 units/245mL) (1,600 Units/hr Intravenous New Bag/Given 01/17/21 0641)  dextromethorphan-guaiFENesin (MUCINEX DM) 30-600 MG per 12 hr tablet 1 tablet (has no administration in time range)  ondansetron (ZOFRAN) injection 4 mg (has no administration in time range)  acetaminophen (TYLENOL) tablet 650 mg (has no administration in  time range)  hydrALAZINE (APRESOLINE) injection 5 mg (has no administration in time range)  albuterol (PROVENTIL) (2.5 MG/3ML) 0.083% nebulizer solution 2.5 mg (has no administration in time range)  furosemide (LASIX) injection 40 mg (40 mg Intravenous Given 01/17/21 0340)  iohexol (OMNIPAQUE) 350 MG/ML injection 75 mL (75 mLs Intravenous Contrast Given 01/17/21 0510)  heparin bolus via infusion 6,500 Units (6,500 Units Intravenous Bolus from Bag 01/17/21 0641)     IMPRESSION / MDM / ASSESSMENT AND PLAN / ED COURSE  I reviewed the triage vital signs and the nursing notes.                              Differential diagnosis includes, but is not limited to, CHF exacerbation, asthma exacerbation, pneumonia, viral URI, pneumothorax, PE, ACS, dissection, deconditioning from obesity, pulmonary hypertension.   The patient is on the cardiac monitor to evaluate for evidence of arrhythmia and/or significant heart rate changes   Patient here with shortness of breath.  He has not hypoxic here but does have increased work of breathing and speaking truncated sentences with minimal exertion.  He describes orthopnea.  Has history of nonischemic cardiomyopathy, asthma and obesity.  He also describes a productive cough but no fevers or chills.  Will obtain cardiac labs, chest  x-ray, COVID and flu swabs.  Given his work of breathing with minimal exertion, I suspect he will need admission.  I have reviewed his previous records.  It appears patient's last echocardiogram was in October 2021 and showed an EF of less than 20%.  Heart cath also in October 2021 -   2nd Mrg lesion is 50% stenosed. Mid LAD lesion is 60% stenosed. 2nd Diag lesion is 70% stenosed. Prox Cx to Mid Cx lesion is 40% stenosed. Mid RCA lesion is 30% stenosed. RPAV lesion is 40% stenosed. Hemodynamic findings consistent with moderate pulmonary hypertension.  Chest x-ray reviewed by myself and radiologist and shows no acute abnormality.  First troponin is minimally elevated.  Second pending.  D-dimer has come back significantly elevated at 1.54.  Will obtain CTA of the chest to evaluate for possible PE.  COVID and flu swabs negative.  Procalcitonin normal.  Suspect bacterial pneumonia is much less likely.   CT chest shows patient has left upper and lower lobe pulmonary emboli.  We will start him on IV heparin infusion.  We will also obtain venous Dopplers of his bilateral lower extremities.  Again he denies any previous history of PE or DVT.  He also has signs of right heart failure on CT imaging.  Low suspicion that this is from his PEs completely as the clot burden is low and may be due to nonischemic cardiomyopathy.  We will give IV Lasix here.  His second troponin is still elevated but flat.  He is not having any chest pain at this time.  Will discuss with medicine for admission.  He is still doing well on room air at rest.  CT scan does show some pericholecystic fluid which I suspect is from his heart failure.  He has no abdominal tenderness on exam but will add on lipase and LFTs given radiology states they cannot exclude cholecystitis.  We will also obtain a right upper quadrant ultrasound but again clinically my suspicion for an infected gallbladder is low.   6:05 AM  Discussed patient's case with  hospitalist, Dr. Leafy Half.  I have recommended admission and patient (and family if present) agree  with this plan. Admitting physician will place admission orders.   I reviewed all nursing notes, vitals, pertinent previous records and reviewed/interpreted all EKGs, lab and urine results, imaging (as available).   FINAL CLINICAL IMPRESSION(S) / ED DIAGNOSES   Final diagnoses:  Cholelithiasis  Leg swelling  Left pulmonary embolus (HCC)  Acute on chronic systolic congestive heart failure (HCC)     Rx / DC Orders   ED Discharge Orders     None        Note:  This document was prepared using Dragon voice recognition software and may include unintentional dictation errors.   Prarthana Parlin, Layla Maw, DO 01/17/21 [phone]

## 2021-01-17 NOTE — Progress Notes (Signed)
ANTICOAGULATION CONSULT NOTE   Pharmacy Consult for heparin infusion Indication: pulmonary embolus  No Known Allergies  Patient Measurements: Height: 5\' 11"  (180.3 cm) Weight: 95.7 kg (211 lb) IBW/kg (Calculated) : 75.3 Heparin Dosing Weight: 94.1 kg  Vital Signs: Temp: 98.7 F (37.1 C) (01/01 1636) Temp Source: Oral (01/01 1636) BP: 111/77 (01/01 2000) Pulse Rate: 92 (01/01 2000)  Labs: Recent Labs    01/17/21 0041 01/17/21 0253 01/17/21 1252 01/17/21 2145  HGB 14.3  --   --   --   HCT 42.5  --   --   --   PLT 230  --   --   --   APTT  --  35  --   --   LABPROT  --  14.6  --   --   INR  --  1.1  --   --   HEPARINUNFRC  --   --  0.50 0.37  CREATININE 1.57*  --   --   --   TROPONINIHS 47* 47*  --   --      Estimated Creatinine Clearance: 67.2 mL/min (A) (by C-G formula based on SCr of 1.57 mg/dL (H)).   Medical History: Past Medical History:  Diagnosis Date   Chronic systolic heart failure (HCC)    Hypertension    NICM (nonischemic cardiomyopathy) (HCC)    Assessment: Pt is 51 yo male presenting to ED c/o SOB being treated for "left lower lobe segmental and subsegmental emboli, few suspected left upper lobe subsegmental emboli."  1/1 @1252  HL= 0.50     thera x1 1/1 @2145  HL=0.37      therapeutic x 2  Goal of Therapy:  Heparin level 0.3-0.7 units/ml Monitor platelets by anticoagulation protocol: Yes   Plan:  1/1 @2145  HL= 0.37     therapeutic x 2 Continue heparin infusion at 1600 units/hr Check HL with AM labs CBC daily while on heparin  54, PharmD, BCPS 01/17/2021 10:32 PM

## 2021-01-17 NOTE — ED Notes (Signed)
Pt O2 96 RA sitting, while ambulating sats dropped to 93, pt audibly SHOB. EDP notified per request.

## 2021-01-17 NOTE — ED Notes (Signed)
Pt states that he tripled up on his lasix today and 'it has not been working.'  Stated that he urinated approx 2-3 hours ago but has not been urinate as much as usual. EDP notified.

## 2021-01-17 NOTE — ED Triage Notes (Signed)
Pt c/o SOB and fluid overload x 1 week. States it has gotten progressively worse

## 2021-01-17 NOTE — ED Notes (Signed)
Pt alert and talking to RN at this time. Family at bedside.

## 2021-01-17 NOTE — ED Notes (Signed)
Pt alert and talking to RN at this time. °

## 2021-01-17 NOTE — H&P (Signed)
History and Physical    Joshua Cordova [id number] DOB: 12-18-1970 DOA: 01/17/2021  Referring MD/NP/PA:   PCP: Venia Carbon, MD   Patient coming from:  The patient is coming from home.  At baseline, pt is independent for most of ADL.        Chief Complaint: Shortness of breath  HPI: Joshua Cordova is a 51 y.o. male with medical history significant of CHF with EF <20%, hypertension, hyperlipidemia, diet-controlled diabetes, asthma, CKD-3A, who presents with shortness of breath  Patient states that she has a short breath for about 1 week, which has been progressively worsening.  Patient has cough with a little dark mucus production.  No chest pain.  No fever or chills.  Patient denies nausea, vomiting, diarrhea or abdominal pain.  No symptoms of UTI.  No recent traveling.  No tenderness in the calf areas.  ED Course: pt was found to have positive D-dimer 1.54, negative COVID PCR, BNP 761, troponin level 47 --> 47, INR 1.1, PTT 35, renal function close to baseline, WBC 8.1, liver function (ALP 89, AST 27, ALT 20, total bilirubin 1.4), temperature normal, blood pressure 114/86, heart rate 107, RR 22, oxygen saturation 93-95% on room air.  CT angio showed PE with small clot burden.  Lower extremity venous Dopplers negative for DVT. Patient is admitted to [place] bed as inpatient.  CTA: 1. Left lower lobe segmental and subsegmental emboli, few suspected left upper lobe subsegmental emboli, overall small clot burden. 2. Moderate panchamber cardiomegaly with venous distention, pulmonary arterial prominence and IVC reflux, the latter which could be due to right heart strain, tricuspid regurgitation or rapid contrast injection. 3. Small pericardial effusion, with heavy three-vessel calcific CAD. Aortic atherosclerosis. 4. Mediastinal and hilar adenopathy. Further evaluation recommended. 5. Patchy ground-glass disease in the posterior lingula which could be due to pulmonary infarction,  pneumonia or asymmetric edema. Faint perihilar haziness is most likely ground-glass edema and there are trace pleural effusions. 6. Cholelithiasis, with pericholecystic edema which could be congestive or due to cholecystitis. Please correlate clinically. 7. Results phoned to Dr. Cyril Mourning Ward at 5:47 a.m., 01/17/2021.  US-RUQ: Gallstones and gallbladder wall thickening. Cholecystitis scratch cholecystitis cannot be excluded. If further imaging is clinically indicated consider nuclear medicine hepatic biliary scan to assess the patency of the cystic duct.   Review of Systems:   General: no fevers, chills, no body weight gain, has fatigue HEENT: no blurry vision, hearing changes or sore throat Respiratory: has dyspnea, coughing, no wheezing CV: no chest pain, no palpitations GI: no nausea, vomiting, abdominal pain, diarrhea, constipation GU: no dysuria, burning on urination, increased urinary frequency, hematuria  Ext: has leg edema Neuro: no unilateral weakness, numbness, or tingling, no vision change or hearing loss Skin: no rash, no skin tear. MSK: No muscle spasm, no deformity, no limitation of range of movement in spin Heme: No easy bruising.  Travel history: No recent long distant travel.  Allergy: No Known Allergies  Past Medical History:  Diagnosis Date   Chronic systolic heart failure (HCC)    Hypertension    NICM (nonischemic cardiomyopathy) ([place])     Past Surgical History:  Procedure Laterality Date   CARDIAC CATHETERIZATION     RIGHT/LEFT HEART CATH AND CORONARY ANGIOGRAPHY N/A 11/01/2019   Procedure: RIGHT/LEFT HEART CATH AND CORONARY ANGIOGRAPHY;  Surgeon: Minna Merritts, MD;  Location: [facility];  Service: Cardiovascular;  Laterality: N/A;    Social History:  reports that he quit smoking about 5  years ago. His smoking use included cigarettes. He smoked an average of 1 pack per day. He has never used smokeless tobacco. He reports that he does not  drink alcohol and does not use drugs.  Family History:  Family History  Problem Relation Age of Onset   Diabetes Mother    Hypertension Father    Heart disease Father    Diabetes Maternal Grandfather    Heart disease Other      Prior to Admission medications   Medication Sig Start Date End Date Taking? Authorizing Provider  albuterol (VENTOLIN HFA) 108 (90 Base) MCG/ACT inhaler TAKE 2 PUFFS BY MOUTH EVERY 6 HOURS AS NEEDED FOR WHEEZE OR SHORTNESS OF BREATH 11/13/20   Viviana Simpler I, MD  atorvastatin (LIPITOR) 40 MG tablet Take 1 tablet (40 mg total) by mouth daily. 12/15/20   Venia Carbon, MD  doxycycline (VIBRA-TABS) 100 MG tablet Take 1 tablet (100 mg total) by mouth 2 (two) times daily. 12/22/20   Venia Carbon, MD  furosemide (LASIX) 80 MG tablet Take 1 tablet (80 mg total) by mouth daily. 07/27/20   Minna Merritts, MD  losartan (COZAAR) 25 MG tablet TAKE ONE TABLET BY MOUTH DAILY 11/17/20   Minna Merritts, MD  predniSONE (DELTASONE) 20 MG tablet Take 2 tablets (40 mg total) by mouth daily. 12/22/20   Venia Carbon, MD    Physical Exam: Vitals:   01/17/21 [id number] 01/17/21 0530 01/17/21 0643 01/17/21 0700  BP: 114/84 106/89 114/88 114/86  Pulse: 91 93 89 96  Resp: 18 20 18 19   Temp:      TempSrc:      SpO2: 97% 93% 95% 93%  Weight:      Height:       General: Not in acute distress HEENT:       Eyes: PERRL, EOMI, no scleral icterus.       ENT: No discharge from the ears and nose, no pharynx injection, no tonsillar enlargement.        Neck: No JVD, no bruit, no mass felt. Heme: No neck lymph node enlargement. Cardiac: S1/S2, RRR, No murmurs, No gallops or rubs. Respiratory: No rales, wheezing, rhonchi or rubs. GI: Soft, nondistended, nontender, no rebound pain, no organomegaly, BS present. GU: No hematuria Ext: 1+ pitting leg edema bilaterally. 1+DP/PT pulse bilaterally. Musculoskeletal: No joint deformities, No joint redness or warmth, no limitation of  ROM in spin. Skin: No rashes.  Neuro: Alert, oriented X3, cranial nerves II-XII grossly intact, moves all extremities normally.  Psych: Patient is not psychotic, no suicidal or hemocidal ideation.  Labs on Admission: I have personally reviewed following labs and imaging studies  CBC: Recent Labs  Lab 01/17/21 0041  WBC 8.1  HGB 14.3  HCT 42.5  MCV 91.4  PLT 123456   Basic Metabolic Panel: Recent Labs  Lab 01/17/21 0041  NA 132*  K 3.5  CL 103  CO2 20*  GLUCOSE 150*  BUN 34*  CREATININE 1.57*  CALCIUM 8.6*   GFR: Estimated Creatinine Clearance: 67.2 mL/min (A) (by C-G formula based on SCr of 1.57 mg/dL (H)). Liver Function Tests: Recent Labs  Lab 01/17/21 0253  AST 27  ALT 20  ALKPHOS 89  BILITOT 1.4*  PROT 7.8  ALBUMIN 3.6   Recent Labs  Lab 01/17/21 0253  LIPASE 45   No results for input(s): AMMONIA in the last 168 hours. Coagulation Profile: Recent Labs  Lab 01/17/21 0253  INR 1.1   Cardiac Enzymes:  No results for input(s): CKTOTAL, CKMB, CKMBINDEX, TROPONINI in the last 168 hours. BNP (last 3 results) No results for input(s): PROBNP in the last 8760 hours. HbA1C: No results for input(s): HGBA1C in the last 72 hours. CBG: No results for input(s): GLUCAP in the last 168 hours. Lipid Profile: No results for input(s): CHOL, HDL, LDLCALC, TRIG, CHOLHDL, LDLDIRECT in the last 72 hours. Thyroid Function Tests: No results for input(s): TSH, T4TOTAL, FREET4, T3FREE, THYROIDAB in the last 72 hours. Anemia Panel: No results for input(s): VITAMINB12, FOLATE, FERRITIN, TIBC, IRON, RETICCTPCT in the last 72 hours. Urine analysis:    Component Value Date/Time   COLORURINE YELLOW (A) 11/01/2019 0055   APPEARANCEUR HAZY (A) 11/01/2019 0055   LABSPEC 1.022 11/01/2019 0055   PHURINE 5.0 11/01/2019 0055   GLUCOSEU NEGATIVE 11/01/2019 0055   HGBUR SMALL (A) 11/01/2019 0055   BILIRUBINUR NEGATIVE 11/01/2019 0055   KETONESUR NEGATIVE 11/01/2019 0055    PROTEINUR >=300 (A) 11/01/2019 0055   NITRITE NEGATIVE 11/01/2019 0055   LEUKOCYTESUR NEGATIVE 11/01/2019 0055   Sepsis Labs: @LABRCNTIP (procalcitonin:4,lacticidven:4) ) Recent Results (from the past 240 hour(s))  Resp Panel by RT-PCR (Flu A&B, Covid) Nasopharyngeal Swab     Status: None   Collection Time: 01/17/21  2:53 AM   Specimen: Nasopharyngeal Swab; Nasopharyngeal(NP) swabs in vial transport medium  Result Value Ref Range Status   SARS Coronavirus 2 by RT PCR NEGATIVE NEGATIVE Final    Comment: (NOTE) SARS-CoV-2 target nucleic acids are NOT DETECTED.  The SARS-CoV-2 RNA is generally detectable in upper respiratory specimens during the acute phase of infection. The lowest concentration of SARS-CoV-2 viral copies this assay can detect is 138 copies/mL. A negative result does not preclude SARS-Cov-2 infection and should not be used as the sole basis for treatment or other patient management decisions. A negative result may occur with  improper specimen collection/handling, submission of specimen other than nasopharyngeal swab, presence of viral mutation(s) within the areas targeted by this assay, and inadequate number of viral copies(<138 copies/mL). A negative result must be combined with clinical observations, patient history, and epidemiological information. The expected result is Negative.  Fact Sheet for Patients:  EntrepreneurPulse.com.au  Fact Sheet for Healthcare Providers:  IncredibleEmployment.be  This test is no t yet approved or cleared by the [place] FDA and  has been authorized for detection and/or diagnosis of SARS-CoV-2 by FDA under an Emergency Use Authorization (EUA). This EUA will remain  in effect (meaning this test can be used) for the duration of the COVID-19 declaration under Section 564(b)(1) of the Act, 21 U.S.C.section 360bbb-3(b)(1), unless the authorization is terminated  or revoked sooner.        Influenza A by PCR NEGATIVE NEGATIVE Final   Influenza B by PCR NEGATIVE NEGATIVE Final    Comment: (NOTE) The Xpert Xpress SARS-CoV-2/FLU/RSV plus assay is intended as an aid in the diagnosis of influenza from Nasopharyngeal swab specimens and should not be used as a sole basis for treatment. Nasal washings and aspirates are unacceptable for Xpert Xpress SARS-CoV-2/FLU/RSV testing.  Fact Sheet for Patients: EntrepreneurPulse.com.au  Fact Sheet for Healthcare Providers: IncredibleEmployment.be  This test is not yet approved or cleared by the [place] FDA and has been authorized for detection and/or diagnosis of SARS-CoV-2 by FDA under an Emergency Use Authorization (EUA). This EUA will remain in effect (meaning this test can be used) for the duration of the COVID-19 declaration under Section 564(b)(1) of the Act, 21 U.S.C. section 360bbb-3(b)(1), unless the authorization is  terminated or revoked.  Performed at [facility], [address] [place], [place] [address]      Radiological Exams on Admission: DG Chest 2 View  Result Date: 01/17/2021 CLINICAL DATA:  Dyspnea EXAM: CHEST - 2 VIEW COMPARISON:  12/15/2020 FINDINGS: The lungs are symmetrically well expanded. There is interval development of mild streaky infiltrate within the retrocardiac left lower lobe, new since prior examination, possibly infectious or inflammatory. The lungs are otherwise clear. No pneumothorax or pleural effusion. Cardiac size is mildly enlarged, unchanged. Pulmonary vascularity is normal. No acute bone abnormality. IMPRESSION: Sparse infiltrate within the left lower lobe, possibly infectious in the acute setting. Electronically Signed   By: Fidela Salisbury M.D.   On: 01/17/2021 01:11   CT Angio Chest PE W and/or Wo Contrast  Result Date: 01/17/2021 CLINICAL DATA:  Positive D-dimer suspected pulmonary embolism. EXAM: CT ANGIOGRAPHY CHEST WITH CONTRAST  TECHNIQUE: Multidetector CT imaging of the chest was performed using the standard protocol during bolus administration of intravenous contrast. Multiplanar CT image reconstructions and MIPs were obtained to evaluate the vascular anatomy. CONTRAST:  20mL OMNIPAQUE IOHEXOL 350 MG/ML SOLN COMPARISON:  PA Lat chest 12/15/2020, 10/31/2019. No prior cross-sectional imaging. FINDINGS: Cardiovascular: There is moderate panchamber cardiomegaly with a small superior recess pericardial effusion, distended superior pulmonary veins, three-vessel heavy calcific CAD and mild aortic atherosclerosis extending into branch arteries. Aortic opacification is insufficient to exclude dissection. There is ectasia of the aortic root to 3.9 cm, remainder is within normal caliber limits. There is a prominent pulmonary trunk 3.4 cm indicating arterial hypertension. Right-sided arteries appear to be free of thrombus. On the left, there are segmental and subsegmental arterial filling defects in the lower lobe and there suspected few subsegmental arterial filling defects in portions of the upper lobe, but with overall small clot burden. There is IVC reflux which may indicate right heart strain, tricuspid regurgitation or rapid contrast injection but the right chambers are not larger than the left. Mediastinum/Nodes: There is mediastinal adenopathy in the right paratracheal and prevascular regions with lymph nodes in both locations up to 1.5 cm in short axis. There bilateral mildly prominent hilar lymph nodes up to 1.2 cm in short axis on the left and up to 1.4 cm in short axis on the right. The lower poles of the thyroid gland are unremarkable. There is mild subareolar gynecomastia. Axillary spaces are clear and no esophageal mass is seen. Lungs/Pleura: There are patchy moderate ground-glass opacities posteriorly in the lingula. There are faint perihilar hazy opacities which are probably due to ground-glass edema. There are trace pleural  effusions. The trachea and central airways are clear Upper Abdomen: No acute abnormality. Liver is mildly steatotic. There are stones in the gallbladder. Pericholecystic edema is present. No upper abdominal free fluid. Musculoskeletal: No acute chest wall abnormality. No acute or significant osseous findings. Review of the MIP images confirms the above findings. IMPRESSION: 1. Left lower lobe segmental and subsegmental emboli, few suspected left upper lobe subsegmental emboli, overall small clot burden. 2. Moderate panchamber cardiomegaly with venous distention, pulmonary arterial prominence and IVC reflux, the latter which could be due to right heart strain, tricuspid regurgitation or rapid contrast injection. 3. Small pericardial effusion, with heavy three-vessel calcific CAD. Aortic atherosclerosis. 4. Mediastinal and hilar adenopathy. Further evaluation recommended. 5. Patchy ground-glass disease in the posterior lingula which could be due to pulmonary infarction, pneumonia or asymmetric edema. Faint perihilar haziness is most likely ground-glass edema and there are trace pleural effusions. 6.  Cholelithiasis, with pericholecystic edema which could be congestive or due to cholecystitis. Please correlate clinically. 7. Results phoned to Dr. Cyril Mourning Ward at 5:47 a.m., 01/17/2021. Electronically Signed   By: Telford Nab M.D.   On: 01/17/2021 05:49   US Venous Img Lower Bilateral (DVT)  Result Date: 01/17/2021 CLINICAL DATA:  Rule out DVT. EXAM: BILATERAL LOWER EXTREMITY VENOUS DOPPLER ULTRASOUND TECHNIQUE: Gray-scale sonography with graded compression, as well as color Doppler and duplex ultrasound were performed to evaluate the lower extremity deep venous systems from the level of the common femoral vein and including the common femoral, femoral, profunda femoral, popliteal and calf veins including the posterior tibial, peroneal and gastrocnemius veins when visible. The superficial great saphenous vein was  also interrogated. Spectral Doppler was utilized to evaluate flow at rest and with distal augmentation maneuvers in the common femoral, femoral and popliteal veins. COMPARISON:  None. FINDINGS: RIGHT LOWER EXTREMITY Common Femoral Vein: No evidence of thrombus. Normal compressibility, respiratory phasicity and response to augmentation. Saphenofemoral Junction: No evidence of thrombus. Normal compressibility and flow on color Doppler imaging. Profunda Femoral Vein: No evidence of thrombus. Normal compressibility and flow on color Doppler imaging. Femoral Vein: No evidence of thrombus. Normal compressibility, respiratory phasicity and response to augmentation. Popliteal Vein: No evidence of thrombus. Normal compressibility, respiratory phasicity and response to augmentation. Calf Veins: No evidence of thrombus. Normal compressibility and flow on color Doppler imaging. Superficial Great Saphenous Vein: No evidence of thrombus. Normal compressibility. Venous Reflux:  None. Other Findings:  None. LEFT LOWER EXTREMITY Common Femoral Vein: No evidence of thrombus. Normal compressibility, respiratory phasicity and response to augmentation. Saphenofemoral Junction: No evidence of thrombus. Normal compressibility and flow on color Doppler imaging. Profunda Femoral Vein: No evidence of thrombus. Normal compressibility and flow on color Doppler imaging. Femoral Vein: No evidence of thrombus. Normal compressibility, respiratory phasicity and response to augmentation. Popliteal Vein: No evidence of thrombus. Normal compressibility, respiratory phasicity and response to augmentation. Calf Veins: No evidence of thrombus. Normal compressibility and flow on color Doppler imaging. Superficial Great Saphenous Vein: No evidence of thrombus. Normal compressibility. Venous Reflux:  None. Other Findings:  None. IMPRESSION: No evidence of deep venous thrombosis in either lower extremity. Electronically Signed   By: Kerby Moors M.D.   On:  01/17/2021 07:14   US ABDOMEN LIMITED RUQ (LIVER/GB)  Result Date: 01/17/2021 CLINICAL DATA:  Right upper quadrant pain EXAM: ULTRASOUND ABDOMEN LIMITED RIGHT UPPER QUADRANT COMPARISON:  CT angio chest from earlier today FINDINGS: Gallbladder: Gallbladder wall thickening is identified measuring up to 4 mm. Gallstones are noted. These measure up to 1.2 cm. No pericholecystic fluid or sonographic Murphy's sign. Common bile duct: Diameter: 4.5 mm Liver: No focal lesion identified. Within normal limits in parenchymal echogenicity. Portal vein is patent on color Doppler imaging with normal direction of blood flow towards the liver. Other: None. IMPRESSION: Gallstones and gallbladder wall thickening. Cholecystitis scratch cholecystitis cannot be excluded. If further imaging is clinically indicated consider nuclear medicine hepatic biliary scan to assess the patency of the cystic duct. Electronically Signed   By: Kerby Moors M.D.   On: 01/17/2021 07:07     EKG: I have personally reviewed.  Sinus rhythm, QTC 483, bilateral atrial enlargement, poor R wave progression  Assessment/Plan Principal Problem:   Acute pulmonary embolism (HCC) Active Problems:   Essential hypertension   Asthma, allergic, mild intermittent, uncomplicated   Type II diabetes mellitus with renal manifestations (HCC)   Chronic systolic CHF (congestive heart failure) ([place])  Elevated troponin   HLD (hyperlipidemia)   CKD (chronic kidney disease), stage IIIa   Mediastinal lymphadenopathy   Acute pulmonary embolism ([place]): Has small clot burden.  No evidence of right heart strain -Admitted to MedSurg bed as inpatient -IV heparin -As needed albuterol -Check hypercoagulable panel   Essential hypertension -Cozaar, metoprolol -Patient is on IV Lasix  Asthma, allergic, mild intermittent, uncomplicated: No wheezing -As needed albuterol  Diet controlled Type II diabetes mellitus with renal manifestations [facility]): Recent A1c 6.8,  well controlled.  Patient's not taking medications currently.  Blood sugar 150 -No acute issues  Chronic systolic CHF (congestive heart failure) ([place]): 2D echo on 10/22/2019 showed EF <20%.  Patient has 1+ leg edema, elevated BNP 761, no pulmonary edema, does not seem to have acute CHF exacerbation, but patient is close to develop CHF exacerbation. -Start Lasix 40 mg twice daily  Elevated troponin: Troponin level 47 --> 47.  No chest pain.  Most likely due to demand ischemia -Check A1c, FLP -Lipitor  HLD (hyperlipidemia) -Lipitor  CKD (chronic kidney disease), stage IIIa: Close to baseline.  Recent baseline creatinine 1.2-1.6.  His creatinine is 1.57, BUN 34 -Follow-up with BMP  Mediastinal lymphadenopathy: Incidental findings by CT angiogram -Follow-up with PCP, repeat CT scan of chest in 3 months  Abnormal right upper quadrant ultrasound findings: Patient does not have any nausea, vomiting, diarrhea or abdominal pain.  Liver functions normal except for mild elevation total bilirubin 1.4.  Clinically no signs of cholecystitis. -Observe closely     DVT ppx: on IV heparin Code Status: Full code Family Communication:  Yes, patient's  mother  at bed side. Disposition Plan:  Anticipate discharge back to previous environment Consults called:  none Admission status and Level of care: Telemetry Medical:     as inpt        Status is: Inpatient  Remains inpatient appropriate because: Patient has multiple comorbidities, now presents with acute PE.  Patient also has elevated troponin.  Her presentation is highly complicated..  Patient has CHF with EF <20%.  Patient is at high risk of deteriorating.  He will need to be treated in hospital for 32 days.          Date of Service 01/17/2021    Ivor Costa [facility]   If 7PM-7AM, please contact night-coverage [email or web address] 01/17/2021, 8:18 AM

## 2021-01-18 DIAGNOSIS — I2699 Other pulmonary embolism without acute cor pulmonale: Secondary | ICD-10-CM

## 2021-01-18 DIAGNOSIS — K921 Melena: Secondary | ICD-10-CM | POA: Diagnosis present

## 2021-01-18 DIAGNOSIS — I959 Hypotension, unspecified: Secondary | ICD-10-CM | POA: Diagnosis present

## 2021-01-18 LAB — CBC
HCT: 40 % (ref 39.0–52.0)
HCT: 38.9 % — ABNORMAL LOW (ref 39.0–52.0)
Hemoglobin: 13.1 g/dL (ref 13.0–17.0)
Hemoglobin: 12.8 g/dL — ABNORMAL LOW (ref 13.0–17.0)
MCH: 29.8 pg (ref 26.0–34.0)
MCH: 30 pg (ref 26.0–34.0)
MCHC: 32.8 g/dL (ref 30.0–36.0)
MCHC: 32.9 g/dL (ref 30.0–36.0)
MCV: 90.9 fL (ref 80.0–100.0)
MCV: 91.3 fL (ref 80.0–100.0)
Platelets: 232 10*3/uL (ref 150–400)
Platelets: 223 10*3/uL (ref 150–400)
RBC: 4.4 MIL/uL (ref 4.22–5.81)
RBC: 4.26 MIL/uL (ref 4.22–5.81)
RDW: 14.6 % (ref 11.5–15.5)
RDW: 14.6 % (ref 11.5–15.5)
WBC: 8.4 10*3/uL (ref 4.0–10.5)
WBC: 7.7 10*3/uL (ref 4.0–10.5)
nRBC: 0 % (ref 0.0–0.2)
nRBC: 0 % (ref 0.0–0.2)

## 2021-01-18 LAB — LIPID PANEL
Cholesterol: 140 mg/dL (ref 0–200)
HDL: 23 mg/dL — ABNORMAL LOW (ref >40)
LDL Cholesterol: 103 mg/dL — ABNORMAL HIGH (ref 0–99)
Total CHOL/HDL Ratio: 6.1 RATIO
Triglycerides: 72 mg/dL (ref <150)
VLDL: 14 mg/dL (ref 0–40)

## 2021-01-18 LAB — TYPE AND SCREEN
ABO/RH(D): O POS
Antibody Screen: NEGATIVE

## 2021-01-18 LAB — HEPARIN LEVEL (UNFRACTIONATED): Heparin Unfractionated: 0.39 IU/mL (ref 0.30–0.70)

## 2021-01-18 MED ORDER — BENZONATATE 100 MG PO CAPS
100.0000 mg | ORAL_CAPSULE | Freq: Three times a day (TID) | ORAL | Status: DC | PRN
  Administered 2021-01-18 (×2): 100 mg via ORAL
  Filled 2021-01-18 (×2): qty 1

## 2021-01-18 MED ORDER — PANTOPRAZOLE SODIUM 40 MG IV SOLR
40.0000 mg | Freq: Two times a day (BID) | INTRAVENOUS | Status: DC
  Administered 2021-01-18 – 2021-01-19 (×3): 40 mg via INTRAVENOUS
  Filled 2021-01-18 (×3): qty 40

## 2021-01-18 MED ORDER — DM-GUAIFENESIN ER 30-600 MG PO TB12
1.0000 | ORAL_TABLET | Freq: Two times a day (BID) | ORAL | Status: DC
  Administered 2021-01-18 – 2021-01-24 (×5): 1 via ORAL
  Filled 2021-01-18 (×16): qty 1

## 2021-01-18 NOTE — Progress Notes (Signed)
ANTICOAGULATION CONSULT NOTE   Pharmacy Consult for heparin infusion Indication: pulmonary embolus  No Known Allergies  Patient Measurements: Height: 5\' 11"  (180.3 cm) Weight: 95.7 kg (211 lb) IBW/kg (Calculated) : 75.3 Heparin Dosing Weight: 94.1 kg  Vital Signs: BP: 103/80 (01/02 0500) Pulse Rate: 82 (01/02 0500)  Labs: Recent Labs    01/17/21 0041 01/17/21 0253 01/17/21 1252 01/17/21 2145 01/18/21 0633  HGB 14.3  --   --   --   --   HCT 42.5  --   --   --   --   PLT 230  --   --   --   --   APTT  --  35  --   --   --   LABPROT  --  14.6  --   --   --   INR  --  1.1  --   --   --   HEPARINUNFRC  --   --  0.50 0.37 0.39  CREATININE 1.57*  --   --   --   --   TROPONINIHS 47* 47*  --   --   --      Estimated Creatinine Clearance: 67.2 mL/min (A) (by C-G formula based on SCr of 1.57 mg/dL (H)).   Medical History: Past Medical History:  Diagnosis Date   Chronic systolic heart failure (HCC)    Hypertension    NICM (nonischemic cardiomyopathy) (HCC)    Assessment: Pt is 51 yo male presenting to ED c/o SOB being treated for "left lower lobe segmental and subsegmental emboli, few suspected left upper lobe subsegmental emboli."  1/1 @1252  HL= 0.50     thera x1 1/1 @2145  HL=0.37      therapeutic x 2 1/2 @0633  HL=0.39      thera  Goal of Therapy:  Heparin level 0.3-0.7 units/ml Monitor platelets by anticoagulation protocol: Yes   Plan:   1/2 @0633  HL=0.39  therapeutic  Continue heparin infusion at 1600 units/hr Check HL with AM labs CBC daily while on heparin  54 PharmD Clinical Pharmacist 01/18/2021

## 2021-01-18 NOTE — Assessment & Plan Note (Signed)
Incidental finding.  Repeat CT scan recommended in 3 months.

## 2021-01-18 NOTE — Hospital Course (Signed)
Joshua Cordova is a 51 y.o. male with medical history significant of CHF with EF <20%, hypertension, hyperlipidemia, diet-controlled diabetes, asthma, CKD-3A, who presents with shortness of breath.  Found to have acute PE as well as acute on chronic combined systolic and diastolic CHF

## 2021-01-18 NOTE — Assessment & Plan Note (Signed)
Patient reports dark maroon color blood in his stool today. One-time episode.  No further bleeding reported. H&H dropped from 14.3-12.8. Given acute PE we will have to continue IV heparin. Monitor closely. Clear liquid diet.  PPI twice daily.  Low threshold to call GI if actually has GI bleeding.

## 2021-01-18 NOTE — Assessment & Plan Note (Signed)
Started on IV heparin. Presents with complaints of right-sided chest pain.  CT scan positive for small PE. Exam reveals left-sided basal crackles as well. Currently on IV heparin. Reported her to have some blood in the stool.  Monitor before transition to p.o.

## 2021-01-18 NOTE — Assessment & Plan Note (Signed)
Essential hypertension. Hypotension. Echocardiogram showed an EF of 20%. Patient is on Lasix at home. CT PE protocol concerning for groundglass opacity concerning for volume overload. Patient was actually given IV Lasix. Blood pressure remaining soft and therefore currently holding antihypertensive regimen and diuretics.

## 2021-01-18 NOTE — Assessment & Plan Note (Signed)
Continue sliding scale 

## 2021-01-18 NOTE — Assessment & Plan Note (Signed)
Baseline serum creatinine around 1.4.  Currently serum creatinine 1.5.  Monitor.  At risk for further worsening in the setting of hypotension.

## 2021-01-18 NOTE — Assessment & Plan Note (Signed)
In the setting of PE as well as chronic CHF. Monitor.

## 2021-01-18 NOTE — Progress Notes (Addendum)
°  Progress Note   Patient: Joshua Cordova [id number] DOB: 1970-07-17 DOA: 01/17/2021     1 DOS: the patient was seen and examined on 01/18/2021   Brief hospital course: Joshua Cordova is a 51 y.o. male with medical history significant of CHF with EF <20%, hypertension, hyperlipidemia, diet-controlled diabetes, asthma, CKD-3A, who presents with shortness of breath.  Found to have acute PE as well as acute on chronic combined systolic and diastolic CHF  Assessment and Plan * Acute pulmonary embolism (Joshua Cordova)- (present on admission) Started on IV heparin. Presents with complaints of right-sided chest pain.  CT scan positive for small PE. Exam reveals left-sided basal crackles as well. Currently on IV heparin. Reported her to have some blood in the stool.  Monitor before transition to p.o.  Elevated troponin- (present on admission) In the setting of PE as well as chronic CHF. Monitor.  Chronic systolic CHF (congestive heart failure) (Joshua Cordova)- (present on admission) Essential hypertension. Hypotension. Echocardiogram showed an EF of 20%. Patient is on Lasix at home. CT PE protocol concerning for groundglass opacity concerning for volume overload. Patient was actually given IV Lasix. Blood pressure remaining soft and therefore currently holding antihypertensive regimen and diuretics.  Hematochezia- (present on admission) Patient reports dark maroon color blood in his stool today. One-time episode.  No further bleeding reported. H&H dropped from 14.3-12.8. Given acute PE we will have to continue IV heparin. Monitor closely. Clear liquid diet.  PPI twice daily.  Low threshold to call GI if actually has GI bleeding.  Asthma, allergic, mild intermittent, uncomplicated- (present on admission) Mild exacerbation.  Monitor.  CKD (chronic kidney disease), stage IIIa- (present on admission) Baseline serum creatinine around 1.4.  Currently serum creatinine 1.5.  Monitor.  At risk for further worsening in  the setting of hypotension.  HLD (hyperlipidemia)- (present on admission) Continue statin  Mediastinal lymphadenopathy- (present on admission) Incidental finding.  Repeat CT scan recommended in 3 months.  Type II diabetes mellitus with renal manifestations (Joshua Cordova)- (present on admission) Continue sliding scale.     Subjective: No nausea no vomiting.  No fever no chills.  Continues to have shortness of breath.  Continues to have some cough.  Congestion has some left-sided chest pain.  Has swelling of the leg as well.  Objective Blood pressure soft.  Tachypneic. General: Appear in mild distress, no Rash; Oral Mucosa Clear, moist. no Abnormal Neck Mass Or lumps, Conjunctiva normal  Cardiovascular: S1 and S2 Present, no Murmur, Respiratory: increased respiratory effort, Bilateral Air entry present and left-sided crackles, left-sided wheezes Abdomen: Bowel Sound present, Soft and no tenderness Extremities: Bilateral pedal edema Neurology: alert and oriented to time, place, and person affect appropriate. no new focal deficit Gait not checked due to patient safety concerns    Data Reviewed: My review of labs, imaging, notes and other tests shows no new significant findings.   Family Communication: Mother at bedside.  Disposition: Status is: Inpatient  Remains inpatient appropriate because: Ongoing hypotension as well as report of hematochezia requiring close monitoring while requiring therapeutic anticoagulation         Time spent: 35 minutes  Author: Berle Mull 01/18/2021 6:02 PM  For on call review [email or web address].

## 2021-01-18 NOTE — Assessment & Plan Note (Signed)
Continue statin. 

## 2021-01-18 NOTE — Assessment & Plan Note (Signed)
Mild exacerbation.  Monitor.

## 2021-01-19 ENCOUNTER — Inpatient Hospital Stay (HOSPITAL_COMMUNITY)
Admit: 2021-01-19 | Discharge: 2021-01-19 | Disposition: A | Discharge status: Home / Self Care | Payer: Medicaid Other | Attending: Internal Medicine | Admitting: Internal Medicine

## 2021-01-19 DIAGNOSIS — I5022 Chronic systolic (congestive) heart failure: Secondary | ICD-10-CM

## 2021-01-19 LAB — ECHOCARDIOGRAM COMPLETE
AR max vel: 1.72 cm2
AV Area VTI: 2.29 cm2
AV Area mean vel: 1.51 cm2
AV Mean grad: 2 mmHg
AV Peak grad: 2.5 mmHg
Ao pk vel: 0.79 m/s
Area-P 1/2: 8.25 cm2
Calc EF: 29.9 %
Height: 71 in
S' Lateral: 7.4 cm
Single Plane A2C EF: 25.2 %
Single Plane A4C EF: 33.7 %
Weight: 3356.8 oz

## 2021-01-19 LAB — CBC
HCT: 36.6 % — ABNORMAL LOW (ref 39.0–52.0)
HCT: 40 % (ref 39.0–52.0)
Hemoglobin: 12.3 g/dL — ABNORMAL LOW (ref 13.0–17.0)
Hemoglobin: 13.2 g/dL (ref 13.0–17.0)
MCH: 29.8 pg (ref 26.0–34.0)
MCH: 30.5 pg (ref 26.0–34.0)
MCHC: 33 g/dL (ref 30.0–36.0)
MCHC: 33.6 g/dL (ref 30.0–36.0)
MCV: 90.3 fL (ref 80.0–100.0)
MCV: 90.8 fL (ref 80.0–100.0)
Platelets: 192 10*3/uL (ref 150–400)
Platelets: 221 10*3/uL (ref 150–400)
RBC: 4.03 MIL/uL — ABNORMAL LOW (ref 4.22–5.81)
RBC: 4.43 MIL/uL (ref 4.22–5.81)
RDW: 14.3 % (ref 11.5–15.5)
RDW: 14.4 % (ref 11.5–15.5)
WBC: 7.9 10*3/uL (ref 4.0–10.5)
WBC: 8 10*3/uL (ref 4.0–10.5)
nRBC: 0 % (ref 0.0–0.2)
nRBC: 0 % (ref 0.0–0.2)

## 2021-01-19 LAB — BETA-2-GLYCOPROTEIN I ABS, IGG/M/A
Beta-2 Glyco I IgG: <9 GPI IgG units (ref 0–20)
Beta-2-Glycoprotein I IgA: <9 GPI IgA units (ref 0–25)
Beta-2-Glycoprotein I IgM: <9 GPI IgM units (ref 0–32)

## 2021-01-19 LAB — HEPARIN LEVEL (UNFRACTIONATED): Heparin Unfractionated: 0.39 IU/mL (ref 0.30–0.70)

## 2021-01-19 LAB — TROPONIN I (HIGH SENSITIVITY): Troponin I (High Sensitivity): 38 ng/L — ABNORMAL HIGH (ref <18)

## 2021-01-19 LAB — HOMOCYSTEINE: Homocysteine: 19.7 umol/L — ABNORMAL HIGH (ref 0.0–14.5)

## 2021-01-19 MED ORDER — FUROSEMIDE 10 MG/ML IJ SOLN
40.0000 mg | Freq: Two times a day (BID) | INTRAMUSCULAR | Status: DC
  Administered 2021-01-19 – 2021-01-21 (×5): 40 mg via INTRAVENOUS
  Filled 2021-01-19 (×5): qty 4

## 2021-01-19 MED ORDER — OXYCODONE HCL 5 MG PO TABS
5.0000 mg | ORAL_TABLET | Freq: Once | ORAL | Status: DC
Start: 2021-01-19 — End: 2021-01-22

## 2021-01-19 NOTE — Plan of Care (Signed)

## 2021-01-19 NOTE — ED Notes (Signed)
Pt reports sharp CP with coughing, 2L [place] applied, pt states chest 'feels heavy' now.  HR 95, RR 26, BP 94/71(79,) O2 96 on 2L.  IPMD notified.

## 2021-01-19 NOTE — Progress Notes (Signed)
ANTICOAGULATION CONSULT NOTE   Pharmacy Consult for heparin infusion Indication: pulmonary embolus  No Known Allergies  Patient Measurements: Height: 5\' 11"  (180.3 cm) Weight: 95.7 kg (211 lb) IBW/kg (Calculated) : 75.3 Heparin Dosing Weight: 94.1 kg  Vital Signs: Temp: 98.3 F (36.8 C) (01/03 0902) BP: 118/86 (01/03 0902) Pulse Rate: 97 (01/03 0902)  Labs: Recent Labs    01/17/21 0041 01/17/21 0253 01/17/21 1252 01/17/21 2145 01/18/21 03/18/21 01/18/21 1015 01/19/21 0025 01/19/21 0047 01/19/21 0533  HGB 14.3  --   --   --  13.1 12.8* 13.2  --  12.3*  HCT 42.5  --   --   --  40.0 38.9* 40.0  --  36.6*  PLT 230  --   --   --  232 223 221  --  192  APTT  --  35  --   --   --   --   --   --   --   LABPROT  --  14.6  --   --   --   --   --   --   --   INR  --  1.1  --   --   --   --   --   --   --   HEPARINUNFRC  --   --    < > 0.37 0.39  --   --   --  0.39  CREATININE 1.57*  --   --   --   --   --   --   --   --   TROPONINIHS 47* 47*  --   --   --   --   --  38*  --    < > = values in this interval not displayed.     Estimated Creatinine Clearance: 67.2 mL/min (A) (by C-G formula based on SCr of 1.57 mg/dL (H)).   Medical History: Past Medical History:  Diagnosis Date   Chronic systolic heart failure (HCC)    Hypertension    NICM (nonischemic cardiomyopathy) (HCC)    Assessment: Pt is 51 yo male presenting to ED c/o SOB being treated for "left lower lobe segmental and subsegmental emboli, few suspected left upper lobe subsegmental emboli."  1/1 @1252  HL= 0.50     thera x1 1/1 @2145  HL=0.37      therapeutic x 2 1/2 @0633  HL=0.39      thera 1/3 @ 0533 HL= 0.39    Therapeutic @ 1600units/hr  Goal of Therapy:  Heparin level 0.3-0.7 units/ml Monitor platelets by anticoagulation protocol: Yes   Plan:  HL remains therapeutic at current rate, Hgb and Plt stable as well Continue heparin infusion at 1600 units/hr Check HL with AM labs CBC daily while on  heparin  Kaitlyn Franko Rodriguez-Guzman PharmD, BCPS 01/19/2021 9:03 AM

## 2021-01-19 NOTE — ED Notes (Signed)
Pt alert at this time, no needs or requests expressed. Family member remains at bedside. °

## 2021-01-19 NOTE — Progress Notes (Signed)
PROGRESS NOTE    Joshua Cordova  [id number] DOB: 06/12/1970 DOA: 01/17/2021 PCP: Venia Carbon, MD    Brief Narrative:  51 year old gentleman with history of chronic congestive heart failure with ejection fraction less than 20%, hypertension, hyperlipidemia, diet-controlled diabetes, presented to the ER with shortness of breath ongoing for about a month.  In the emergency room, he was initially on 2 L oxygen.  CT angiogram showed left upper lobe and middle lobe segmental and subsegmental PE.  Patient was also noted to be grossly fluid overloaded.   Assessment & Plan:   Principal Problem:   Acute pulmonary embolism (HCC) Active Problems:   Essential hypertension   Asthma, allergic, mild intermittent, uncomplicated   Type II diabetes mellitus with renal manifestations (HCC)   Chronic systolic CHF (congestive heart failure) (HCC)   Elevated troponin   HLD (hyperlipidemia)   CKD (chronic kidney disease), stage IIIa   Mediastinal lymphadenopathy   Hematochezia   Hypotension  Acute pulmonary embolism: Patient with severe congestive heart failure.  This was probably due to congestive heart failure.  Rule out intracardiac thrombus, echocardiogram pending. Patient is weaned to room air.  Currently on heparin infusion.  We will continue heparin.  Will change to Eliquis on discharge. Lower extremity duplexes were negative for DVT.  Acute on chronic systolic congestive heart failure: Presents with fluid overload and hypoxemia. Blood pressure soft.  Today better.  Resume IV Lasix.  Repeat echocardiogram today.  Unable to tolerate ACE.  Beta-blockers with parameters.  If any significant findings, will discuss with his cardiologist.  CKD stage IIIa: At about baseline.  Will monitor on diuretics.  Type 2 diabetes with CKD stage IIIa: Known A1c 6.8.  Diet controlled.  Not on treatment.  He will continue low-carb diet.  Essential hypertension: Currently  Intolerance to antihypertensives.   Will monitor.  Will need heart failure therapies to resume.   DVT prophylaxis: Place and maintain sequential compression device Start: 01/18/21 1113 Place TED hose Start: 01/18/21 1112   Code Status: Full code Family Communication: Mother at the bedside Disposition Plan: Status is: Inpatient  Remains inpatient appropriate because: IV heparin, IV diuresis         Consultants:  None  Procedures:  None  Antimicrobials:  None   Subjective: Patient seen and examined.  After is staying more than 24 hours in the emergency room, he arrived to the medical floor.  Breathing better.  Legs are swollen and edematous.  Some left-sided pleuritic pain on deep breathing but overall feels better.  Denies any chest pain.  Telemetry with sinus rhythm.  Mother at the bedside.  Patient wants to shower.  Objective: Vitals:   01/19/21 0537 01/19/21 0600 01/19/21 0730 01/19/21 0902  BP: 113/89 111/70 103/77 118/86  Pulse: 88 90 92 97  Resp: 18 20 (!) 23 20  Temp:    98.3 F (36.8 C)  TempSrc:      SpO2: 97% 96% 95% 100%  Weight:    95.2 kg  Height:    5\' 11"  (1.803 m)   No intake or output data in the 24 hours ending 01/19/21 1116 Filed Weights   01/17/21 0033 01/19/21 0902  Weight: 95.7 kg 95.2 kg    Examination:  General exam: Appears calm and comfortable  Chronically sick looking and debilitated.  Not in any distress.  Currently on room air. Respiratory system: Bilateral good air entry with some expiratory crackles at posterior base. Cardiovascular system: S1 & S2 heard, RRR.  Patient  is with bilateral feet edema, 3+. Gastrointestinal system: Abdomen is nondistended, soft and nontender. No organomegaly or masses felt. Normal bowel sounds heard. Central nervous system: Alert and oriented. No focal neurological deficits. Extremities: Symmetric 5 x 5 power. Skin: No rashes, lesions or ulcers Psychiatry: Judgement and insight appear normal. Mood & affect appropriate.     Data  Reviewed: I have personally reviewed following labs and imaging studies  CBC: Recent Labs  Lab 01/17/21 0041 01/18/21 0633 01/18/21 1015 01/19/21 0025 01/19/21 0533  WBC 8.1 8.4 7.7 7.9 8.0  HGB 14.3 13.1 12.8* 13.2 12.3*  HCT 42.5 40.0 38.9* 40.0 36.6*  MCV 91.4 90.9 91.3 90.3 90.8  PLT 230 232 223 221 [id number]   Basic Metabolic Panel: Recent Labs  Lab 01/17/21 0041  NA 132*  K 3.5  CL 103  CO2 20*  GLUCOSE 150*  BUN 34*  CREATININE 1.57*  CALCIUM 8.6*   GFR: Estimated Creatinine Clearance: 67.1 mL/min (A) (by C-G formula based on SCr of 1.57 mg/dL (H)). Liver Function Tests: Recent Labs  Lab 01/17/21 0253  AST 27  ALT 20  ALKPHOS 89  BILITOT 1.4*  PROT 7.8  ALBUMIN 3.6   Recent Labs  Lab 01/17/21 0253  LIPASE 45   No results for input(s): AMMONIA in the last 168 hours. Coagulation Profile: Recent Labs  Lab 01/17/21 0253  INR 1.1   Cardiac Enzymes: No results for input(s): CKTOTAL, CKMB, CKMBINDEX, TROPONINI in the last 168 hours. BNP (last 3 results) No results for input(s): PROBNP in the last 8760 hours. HbA1C: Recent Labs    01/17/21 1252  HGBA1C 6.8*   CBG: No results for input(s): GLUCAP in the last 168 hours. Lipid Profile: Recent Labs    01/18/21 0633  CHOL 140  HDL 23*  LDLCALC 103*  TRIG 72  CHOLHDL 6.1   Thyroid Function Tests: No results for input(s): TSH, T4TOTAL, FREET4, T3FREE, THYROIDAB in the last 72 hours. Anemia Panel: No results for input(s): VITAMINB12, FOLATE, FERRITIN, TIBC, IRON, RETICCTPCT in the last 72 hours. Sepsis Labs: Recent Labs  Lab 01/17/21 0041  PROCALCITON <0.10    Recent Results (from the past 240 hour(s))  Resp Panel by RT-PCR (Flu A&B, Covid) Nasopharyngeal Swab     Status: None   Collection Time: 01/17/21  2:53 AM   Specimen: Nasopharyngeal Swab; Nasopharyngeal(NP) swabs in vial transport medium  Result Value Ref Range Status   SARS Coronavirus 2 by RT PCR NEGATIVE NEGATIVE Final     Comment: (NOTE) SARS-CoV-2 target nucleic acids are NOT DETECTED.  The SARS-CoV-2 RNA is generally detectable in upper respiratory specimens during the acute phase of infection. The lowest concentration of SARS-CoV-2 viral copies this assay can detect is 138 copies/mL. A negative result does not preclude SARS-Cov-2 infection and should not be used as the sole basis for treatment or other patient management decisions. A negative result may occur with  improper specimen collection/handling, submission of specimen other than nasopharyngeal swab, presence of viral mutation(s) within the areas targeted by this assay, and inadequate number of viral copies(<138 copies/mL). A negative result must be combined with clinical observations, patient history, and epidemiological information. The expected result is Negative.  Fact Sheet for Patients:  EntrepreneurPulse.com.au  Fact Sheet for Healthcare Providers:  IncredibleEmployment.be  This test is no t yet approved or cleared by the [place] FDA and  has been authorized for detection and/or diagnosis of SARS-CoV-2 by FDA under an Emergency Use Authorization (EUA). This EUA will remain  in effect (meaning this test can be used) for the duration of the COVID-19 declaration under Section 564(b)(1) of the Act, 21 U.S.C.section 360bbb-3(b)(1), unless the authorization is terminated  or revoked sooner.       Influenza A by PCR NEGATIVE NEGATIVE Final   Influenza B by PCR NEGATIVE NEGATIVE Final    Comment: (NOTE) The Xpert Xpress SARS-CoV-2/FLU/RSV plus assay is intended as an aid in the diagnosis of influenza from Nasopharyngeal swab specimens and should not be used as a sole basis for treatment. Nasal washings and aspirates are unacceptable for Xpert Xpress SARS-CoV-2/FLU/RSV testing.  Fact Sheet for Patients: EntrepreneurPulse.com.au  Fact Sheet for Healthcare  Providers: IncredibleEmployment.be  This test is not yet approved or cleared by the [place] FDA and has been authorized for detection and/or diagnosis of SARS-CoV-2 by FDA under an Emergency Use Authorization (EUA). This EUA will remain in effect (meaning this test can be used) for the duration of the COVID-19 declaration under Section 564(b)(1) of the Act, 21 U.S.C. section 360bbb-3(b)(1), unless the authorization is terminated or revoked.  Performed at [facility], [address], [place], [place] [address]          Radiology Studies: No results found.      Scheduled Meds:  dextromethorphan-guaiFENesin  1 tablet Oral BID   furosemide  40 mg Intravenous Q12H   oxyCODONE  5 mg Oral Once   pantoprazole (PROTONIX) IV  40 mg Intravenous Q12H   Continuous Infusions:  heparin 1,600 Units/hr (01/19/21 0235)     LOS: 2 days    Time spent: 30 minutes .    Barb Merino, MD [facility] Pager [phone]

## 2021-01-19 NOTE — ED Notes (Signed)
Pt states SHOB d/t strong cough.  2L O2NC  applied.  Pt symptoms resolved.  PRN cough med to be given.

## 2021-01-19 NOTE — ED Notes (Signed)
Troponin drawn per MD order and PRN oxycodone ordered.  Pt states pain resolved at this time.  No pain medication given.

## 2021-01-19 NOTE — Progress Notes (Signed)
*  PRELIMINARY RESULTS* Echocardiogram 2D Echocardiogram has been performed.  Cristela Blue 01/19/2021, 2:18 PM

## 2021-01-19 NOTE — ED Notes (Signed)
Pt C/O SHOB during strong coughing.  2L O2NC applied.  Pt symptoms resolved.  PRN cough med to be given.  Pt requested stating that this med seemed to resolve cough earlier today.

## 2021-01-20 LAB — CARDIOLIPIN ANTIBODIES, IGG, IGM, IGA
Anticardiolipin IgA: <9 APL U/mL (ref 0–11)
Anticardiolipin IgG: <9 GPL U/mL (ref 0–14)
Anticardiolipin IgM: <9 MPL U/mL (ref 0–12)

## 2021-01-20 LAB — CBC WITH DIFFERENTIAL/PLATELET
Abs Immature Granulocytes: 0.05 10*3/uL (ref 0.00–0.07)
Basophils Absolute: 0.1 10*3/uL (ref 0.0–0.1)
Basophils Relative: 1 %
Eosinophils Absolute: 0.2 10*3/uL (ref 0.0–0.5)
Eosinophils Relative: 3 %
HCT: 36.2 % — ABNORMAL LOW (ref 39.0–52.0)
Hemoglobin: 12.1 g/dL — ABNORMAL LOW (ref 13.0–17.0)
Immature Granulocytes: 1 %
Lymphocytes Relative: 23 %
Lymphs Abs: 1.7 10*3/uL (ref 0.7–4.0)
MCH: 30.3 pg (ref 26.0–34.0)
MCHC: 33.4 g/dL (ref 30.0–36.0)
MCV: 90.5 fL (ref 80.0–100.0)
Monocytes Absolute: 0.6 10*3/uL (ref 0.1–1.0)
Monocytes Relative: 8 %
Neutro Abs: 4.9 10*3/uL (ref 1.7–7.7)
Neutrophils Relative %: 64 %
Platelets: 215 10*3/uL (ref 150–400)
RBC: 4 MIL/uL — ABNORMAL LOW (ref 4.22–5.81)
RDW: 14.6 % (ref 11.5–15.5)
WBC: 7.5 10*3/uL (ref 4.0–10.5)
nRBC: 0 % (ref 0.0–0.2)

## 2021-01-20 LAB — PROTEIN S, TOTAL: Protein S Ag, Total: 82 % (ref 60–150)

## 2021-01-20 LAB — BASIC METABOLIC PANEL
Anion gap: 10 (ref 5–15)
BUN: 40 mg/dL — ABNORMAL HIGH (ref 6–20)
CO2: 22 mmol/L (ref 22–32)
Calcium: 8.3 mg/dL — ABNORMAL LOW (ref 8.9–10.3)
Chloride: 104 mmol/L (ref 98–111)
Creatinine, Ser: 1.83 mg/dL — ABNORMAL HIGH (ref 0.61–1.24)
GFR, Estimated: 45 mL/min — ABNORMAL LOW (ref >60)
Glucose, Bld: 123 mg/dL — ABNORMAL HIGH (ref 70–99)
Potassium: 3.5 mmol/L (ref 3.5–5.1)
Sodium: 136 mmol/L (ref 135–145)

## 2021-01-20 LAB — LUPUS ANTICOAGULANT PANEL
DRVVT: 42.5 s (ref 0.0–47.0)
PTT Lupus Anticoagulant: 45.3 s (ref 0.0–51.9)

## 2021-01-20 LAB — MAGNESIUM: Magnesium: 2.2 mg/dL (ref 1.7–2.4)

## 2021-01-20 LAB — PROTEIN S ACTIVITY: Protein S Activity: 84 % (ref 63–140)

## 2021-01-20 LAB — PROTEIN C, TOTAL: Protein C, Total: 59 % — ABNORMAL LOW (ref 60–150)

## 2021-01-20 LAB — HEPARIN LEVEL (UNFRACTIONATED): Heparin Unfractionated: 0.51 IU/mL (ref 0.30–0.70)

## 2021-01-20 LAB — PROTEIN C ACTIVITY: Protein C Activity: 60 % — ABNORMAL LOW (ref 73–180)

## 2021-01-20 LAB — PHOSPHORUS: Phosphorus: 4.3 mg/dL (ref 2.5–4.6)

## 2021-01-20 NOTE — Plan of Care (Signed)

## 2021-01-20 NOTE — Progress Notes (Addendum)
PROGRESS NOTE    Joshua Cordova  KGS:[id number] DOB: Sep 15, 1970 DOA: 01/17/2021 PCP: Karie Schwalbe, MD    Brief Narrative:  51 year old gentleman with history of chronic congestive heart failure with ejection fraction less than 20%, hypertension, hyperlipidemia, diet-controlled diabetes, presented to the ER with shortness of breath ongoing for about a month.  In the emergency room, he was initially on 2 L oxygen.  CT angiogram showed left upper lobe and middle lobe segmental and subsegmental PE.  Patient was also noted to be grossly fluid overloaded.   Assessment & Plan:   Principal Problem:   Acute pulmonary embolism (HCC) Active Problems:   Essential hypertension   Asthma, allergic, mild intermittent, uncomplicated   Type II diabetes mellitus with renal manifestations (HCC)   Chronic systolic CHF (congestive heart failure) (HCC)   Elevated troponin   HLD (hyperlipidemia)   CKD (chronic kidney disease), stage IIIa   Mediastinal lymphadenopathy   Hematochezia   Hypotension  Acute pulmonary embolism: Patient with severe congestive heart failure.  This was probably due to congestive heart failure.  Ruled out intracardiac thrombus  Currently on heparin infusion.  We will continue heparin.  Will change to Eliquis on discharge. Lower extremity duplexes were negative for DVT.  Acute on chronic systolic congestive heart failure: Acute hypoxemic respiratory failure.  Presents with fluid overload and hypoxemia.  Echocardiogram with ejection fraction less than 20%.  Recent cardiac cath with no significant coronary artery disease.  Followed by cardiology as outpatient.  Blood pressure soft but tolerating IV Lasix 40 mg twice a day.  Unable to tolerate any ARB.  Beta-blockers with parameters. Due to significant fluid overload, will talk to cardiology for consultation today.  Intake output monitoring.  Daily weights.  AKI on CKD stage IIIa: At about baseline.  Will monitor on  diuretics.  Type 2 diabetes with CKD stage IIIa: Known A1c 6.8.  Diet controlled.  Not on treatment.  He will continue low-carb diet.  Essential hypertension: Currently  Intolerance to antihypertensives.  Will monitor.  Will need heart failure therapies to resume.   DVT prophylaxis: Place and maintain sequential compression device Start: 01/18/21 1113 Place TED hose Start: 01/18/21 1112   Code Status: Full code Family Communication: None today. Disposition Plan: Status is: Inpatient  Remains inpatient appropriate because: IV heparin, IV diuresis         Consultants:  Cardiology  Procedures:  None  Antimicrobials:  None   Subjective: Patient seen and examined.  Short of breath on going to the bathroom.  Remains on supplemental oxygen.  Denies any chest pain.  Started having sputum production which is mostly mucoid and clear. Legs are swollen and edematous.  He is wearing stockings.  Objective: Vitals:   01/19/21 2350 01/20/21 0439 01/20/21 0737 01/20/21 1132  BP: 109/86 106/90 116/82 108/87  Pulse: 91 91 67 96  Resp: 18 18 19 18   Temp: 98.6 F (37 C) 98.7 F (37.1 C) 98.6 F (37 C) 98.5 F (36.9 C)  TempSrc:      SpO2: 99% 99% 96% 96%  Weight:      Height:        Intake/Output Summary (Last 24 hours) at 01/20/2021 1228 Last data filed at 01/20/2021 1011 Gross per 24 hour  Intake 904 ml  Output 2860 ml  Net -1956 ml   Filed Weights   01/17/21 0033 01/19/21 0902  Weight: 95.7 kg 95.2 kg    Examination:  General: Chronically sick looking.  Looks fairly comfortable  on supplemental oxygen. Cardiovascular: S1-S2 normal.  Poor air entry at bases. Respiratory: Poor air entry at bases.  No added sounds.   Gastrointestinal: Soft and nontender.  Bowel sound present. Ext: 2+ edema mostly on the dorsum of the feet.     Data Reviewed: I have personally reviewed following labs and imaging studies  CBC: Recent Labs  Lab 01/18/21 0633 01/18/21 1015  01/19/21 0025 01/19/21 0533 01/20/21 0626  WBC 8.4 7.7 7.9 8.0 7.5  NEUTROABS  --   --   --   --  4.9  HGB 13.1 12.8* 13.2 12.3* 12.1*  HCT 40.0 38.9* 40.0 36.6* 36.2*  MCV 90.9 91.3 90.3 90.8 90.5  PLT 232 223 221 192 [id number]   Basic Metabolic Panel: Recent Labs  Lab 01/17/21 0041 01/20/21 0626  NA 132* 136  K 3.5 3.5  CL 103 104  CO2 20* 22  GLUCOSE 150* 123*  BUN 34* 40*  CREATININE 1.57* 1.83*  CALCIUM 8.6* 8.3*  MG  --  2.2  PHOS  --  4.3   GFR: Estimated Creatinine Clearance: 57.5 mL/min (A) (by C-G formula based on SCr of 1.83 mg/dL (H)). Liver Function Tests: Recent Labs  Lab 01/17/21 0253  AST 27  ALT 20  ALKPHOS 89  BILITOT 1.4*  PROT 7.8  ALBUMIN 3.6   Recent Labs  Lab 01/17/21 0253  LIPASE 45   No results for input(s): AMMONIA in the last 168 hours. Coagulation Profile: Recent Labs  Lab 01/17/21 0253  INR 1.1   Cardiac Enzymes: No results for input(s): CKTOTAL, CKMB, CKMBINDEX, TROPONINI in the last 168 hours. BNP (last 3 results) No results for input(s): PROBNP in the last 8760 hours. HbA1C: Recent Labs    01/17/21 1252  HGBA1C 6.8*   CBG: No results for input(s): GLUCAP in the last 168 hours. Lipid Profile: Recent Labs    01/18/21 0633  CHOL 140  HDL 23*  LDLCALC 103*  TRIG 72  CHOLHDL 6.1   Thyroid Function Tests: No results for input(s): TSH, T4TOTAL, FREET4, T3FREE, THYROIDAB in the last 72 hours. Anemia Panel: No results for input(s): VITAMINB12, FOLATE, FERRITIN, TIBC, IRON, RETICCTPCT in the last 72 hours. Sepsis Labs: Recent Labs  Lab 01/17/21 0041  PROCALCITON <0.10    Recent Results (from the past 240 hour(s))  Resp Panel by RT-PCR (Flu A&B, Covid) Nasopharyngeal Swab     Status: None   Collection Time: 01/17/21  2:53 AM   Specimen: Nasopharyngeal Swab; Nasopharyngeal(NP) swabs in vial transport medium  Result Value Ref Range Status   SARS Coronavirus 2 by RT PCR NEGATIVE NEGATIVE Final    Comment:  (NOTE) SARS-CoV-2 target nucleic acids are NOT DETECTED.  The SARS-CoV-2 RNA is generally detectable in upper respiratory specimens during the acute phase of infection. The lowest concentration of SARS-CoV-2 viral copies this assay can detect is 138 copies/mL. A negative result does not preclude SARS-Cov-2 infection and should not be used as the sole basis for treatment or other patient management decisions. A negative result may occur with  improper specimen collection/handling, submission of specimen other than nasopharyngeal swab, presence of viral mutation(s) within the areas targeted by this assay, and inadequate number of viral copies(<138 copies/mL). A negative result must be combined with clinical observations, patient history, and epidemiological information. The expected result is Negative.  Fact Sheet for Patients:  EntrepreneurPulse.com.au  Fact Sheet for Healthcare Providers:  IncredibleEmployment.be  This test is no t yet approved or cleared by the [place]  FDA and  has been authorized for detection and/or diagnosis of SARS-CoV-2 by FDA under an Emergency Use Authorization (EUA). This EUA will remain  in effect (meaning this test can be used) for the duration of the COVID-19 declaration under Section 564(b)(1) of the Act, 21 U.S.C.section 360bbb-3(b)(1), unless the authorization is terminated  or revoked sooner.       Influenza A by PCR NEGATIVE NEGATIVE Final   Influenza B by PCR NEGATIVE NEGATIVE Final    Comment: (NOTE) The Xpert Xpress SARS-CoV-2/FLU/RSV plus assay is intended as an aid in the diagnosis of influenza from Nasopharyngeal swab specimens and should not be used as a sole basis for treatment. Nasal washings and aspirates are unacceptable for Xpert Xpress SARS-CoV-2/FLU/RSV testing.  Fact Sheet for Patients: EntrepreneurPulse.com.au  Fact Sheet for Healthcare  Providers: IncredibleEmployment.be  This test is not yet approved or cleared by the [place] FDA and has been authorized for detection and/or diagnosis of SARS-CoV-2 by FDA under an Emergency Use Authorization (EUA). This EUA will remain in effect (meaning this test can be used) for the duration of the COVID-19 declaration under Section 564(b)(1) of the Act, 21 U.S.C. section 360bbb-3(b)(1), unless the authorization is terminated or revoked.  Performed at [facility], [address] [place], [place] [address]          Radiology Studies: ECHOCARDIOGRAM COMPLETE  Result Date: 01/19/2021    ECHOCARDIOGRAM REPORT   Patient Name:   OSKER MANLEY Date of Exam: 01/19/2021 Medical Rec #:  [id number]   Height:       71.0 in Accession #:    [id number]  Weight:       209.8 lb Date of Birth:  [birth year]  BSA:          2.152 m Patient Age:    17 years    BP:           103/82 mmHg Patient Gender: M           HR:           94 bpm. Exam Location:  [facility] Procedure: 2D Echo, Cardiac Doppler and Color Doppler Indications:     CHF I50.9  History:         Patient has prior history of Echocardiogram examinations, most                  recent 10/31/2020. Risk Factors:Hypertension. Chronic systolic                  heart failure, Non-ischemic cardiomyopathy.  Sonographer:     Sherrie Sport Referring Phys:  [id number] Barb Merino Diagnosing Phys: Nelva Bush MD IMPRESSIONS  1. Left ventricular ejection fraction, by estimation, is 20 to 25%. The left ventricle has severely decreased function. The left ventricle demonstrates global hypokinesis. The left ventricular internal cavity size was severely dilated. Left ventricular diastolic parameters are consistent with Grade II diastolic dysfunction (pseudonormalization). Elevated left atrial pressure.  2. Right ventricular systolic function is moderately reduced. The right ventricular size is moderately enlarged. There is severely elevated  pulmonary artery systolic pressure.  3. Left atrial size was severely dilated.  4. Right atrial size was severely dilated.  5. The mitral valve is abnormal. Moderate to severe mitral valve regurgitation.  6. Tricuspid valve regurgitation is moderate.  7. The aortic valve is tricuspid. Aortic valve regurgitation is mild. No aortic stenosis is present.  8. The inferior vena cava is dilated in size with <50% respiratory variability, suggesting right  atrial pressure of 15 mmHg. FINDINGS  Left Ventricle: Left ventricular ejection fraction, by estimation, is 20 to 25%. The left ventricle has severely decreased function. The left ventricle demonstrates global hypokinesis. The left ventricular internal cavity size was severely dilated. There is no left ventricular hypertrophy. Left ventricular diastolic parameters are consistent with Grade II diastolic dysfunction (pseudonormalization). Elevated left atrial pressure. Right Ventricle: The right ventricular size is moderately enlarged. No increase in right ventricular wall thickness. Right ventricular systolic function is moderately reduced. There is severely elevated pulmonary artery systolic pressure. The tricuspid regurgitant velocity is 4.02 m/s, and with an assumed right atrial pressure of 15 mmHg, the estimated right ventricular systolic pressure is 123456 mmHg. Left Atrium: Left atrial size was severely dilated. Right Atrium: Right atrial size was severely dilated. Pericardium: There is no evidence of pericardial effusion. Mitral Valve: The mitral valve is abnormal. There is mild thickening of the mitral valve leaflet(s). Moderate to severe mitral valve regurgitation. Tricuspid Valve: The tricuspid valve is not well visualized. Tricuspid valve regurgitation is moderate. Aortic Valve: The aortic valve is tricuspid. Aortic valve regurgitation is mild. No aortic stenosis is present. Aortic valve mean gradient measures 2.0 mmHg. Aortic valve peak gradient measures 2.5 mmHg.  Aortic valve area, by VTI measures 2.29 cm. Pulmonic Valve: The pulmonic valve was normal in structure. Pulmonic valve regurgitation is trivial. No evidence of pulmonic stenosis. Aorta: The aortic root is normal in size and structure. Pulmonary Artery: The pulmonary artery is not well seen. Venous: The inferior vena cava is dilated in size with less than 50% respiratory variability, suggesting right atrial pressure of 15 mmHg. IAS/Shunts: The interatrial septum was not well visualized.  LEFT VENTRICLE PLAX 2D LVIDd:         7.80 cm      Diastology LVIDs:         7.40 cm      LV e' medial:   4.13 cm/s LV PW:         0.70 cm      LV E/e' medial: 21.7 LV IVS:        0.90 cm LVOT diam:     2.10 cm LV SV:         19 LV SV Index:   9 LVOT Area:     3.46 cm  LV Volumes (MOD) LV vol d, MOD A2C: 345.0 ml LV vol d, MOD A4C: 309.0 ml LV vol s, MOD A2C: 258.0 ml LV vol s, MOD A4C: 205.0 ml LV SV MOD A2C:     87.0 ml LV SV MOD A4C:     309.0 ml LV SV MOD BP:      100.1 ml RIGHT VENTRICLE RV Basal diam:  4.90 cm LEFT ATRIUM              Index        RIGHT ATRIUM           Index LA diam:        5.80 cm  2.70 cm/m   RA Area:     32.00 cm LA Vol (A2C):   144.0 ml 66.92 ml/m  RA Volume:   118.00 ml 54.84 ml/m LA Vol (A4C):   104.0 ml 48.33 ml/m LA Biplane Vol: 124.0 ml 57.62 ml/m  AORTIC VALVE                    PULMONIC VALVE AV Area (Vmax):    1.72 cm     PV  Vmax:          0.35 m/s AV Area (Vmean):   1.51 cm     PV Vmean:         23.300 cm/s AV Area (VTI):     2.29 cm     PV VTI:           0.036 m AV Vmax:           78.90 cm/s   PV Peak grad:     0.5 mmHg AV Vmean:          58.400 cm/s  PV Mean grad:     0.0 mmHg AV VTI:            0.081 m      PR End Diast Vel: 13.99 msec AV Peak Grad:      2.5 mmHg     RVOT Peak grad:   1 mmHg AV Mean Grad:      2.0 mmHg LVOT Vmax:         39.20 cm/s LVOT Vmean:        25.500 cm/s LVOT VTI:          0.054 m LVOT/AV VTI ratio: 0.66  AORTA Ao Root diam: 3.27 cm MITRAL VALVE                TRICUSPID VALVE MV Area (PHT): 8.25 cm    TR Peak grad:   64.6 mmHg MV Decel Time: 92 msec     TR Vmax:        402.00 cm/s MV E velocity: 89.50 cm/s MV A velocity: 45.00 cm/s  SHUNTS MV E/A ratio:  1.99        Systemic VTI:  0.05 m                            Systemic Diam: 2.10 cm                            Pulmonic VTI:  0.072 m Harrell Gave End MD Electronically signed by Nelva Bush MD Signature Date/Time: 01/19/2021/4:45:21 PM    Final         Scheduled Meds:  dextromethorphan-guaiFENesin  1 tablet Oral BID   furosemide  40 mg Intravenous Q12H   oxyCODONE  5 mg Oral Once   Continuous Infusions:  heparin 1,600 Units/hr (01/20/21 1135)     LOS: 3 days    Time spent: 30 minutes .    Barb Merino, MD [facility] Pager [phone]

## 2021-01-20 NOTE — Progress Notes (Signed)
ANTICOAGULATION CONSULT NOTE   Pharmacy Consult for heparin infusion Indication: pulmonary embolus  No Known Allergies  Patient Measurements: Height: 5\' 11"  (180.3 cm) Weight: 95.2 kg (209 lb 12.8 oz) IBW/kg (Calculated) : 75.3 Heparin Dosing Weight: 94.1 kg  Vital Signs: Temp: 98.6 F (37 C) (01/04 0737) BP: 116/82 (01/04 0737) Pulse Rate: 67 (01/04 0737)  Labs: Recent Labs    01/18/21 0633 01/18/21 1015 01/19/21 0025 01/19/21 0047 01/19/21 0533 01/20/21 0626  HGB 13.1   < > 13.2  --  12.3* 12.1*  HCT 40.0   < > 40.0  --  36.6* 36.2*  PLT 232   < > 221  --  192 215  HEPARINUNFRC 0.39  --   --   --  0.39 0.51  CREATININE  --   --   --   --   --  1.83*  TROPONINIHS  --   --   --  38*  --   --    < > = values in this interval not displayed.     Estimated Creatinine Clearance: 57.5 mL/min (A) (by C-G formula based on SCr of 1.83 mg/dL (H)).   Medical History: Past Medical History:  Diagnosis Date   Chronic systolic heart failure (HCC)    Hypertension    NICM (nonischemic cardiomyopathy) (HCC)    Assessment: Pt is 51 yo male presenting to ED c/o SOB being treated for "left lower lobe segmental and subsegmental emboli, few suspected left upper lobe subsegmental emboli."  1/1 @1252  HL= 0.50     thera x1 1/1 @2145  HL=0.37      therapeutic x 2 1/2 @0633  HL=0.39      thera 1/3 @ 0533 HL= 0.39    Therapeutic 1/4 @0626  HL = 0.51     Therapeutic  Goal of Therapy:  Heparin level 0.3-0.7 units/ml Monitor platelets by anticoagulation protocol: Yes   Plan:  HL remains therapeutic at current rate, Hgb and Plt stable as well Continue heparin infusion at 1600 units/hr Check HL with AM labs CBC daily while on heparin  54, PharmD Clinical Pharmacist  01/20/2021 8:03 AM

## 2021-01-21 DIAGNOSIS — R778 Other specified abnormalities of plasma proteins: Secondary | ICD-10-CM

## 2021-01-21 DIAGNOSIS — N1831 Chronic kidney disease, stage 3a: Secondary | ICD-10-CM

## 2021-01-21 DIAGNOSIS — I420 Dilated cardiomyopathy: Secondary | ICD-10-CM

## 2021-01-21 DIAGNOSIS — E782 Mixed hyperlipidemia: Secondary | ICD-10-CM

## 2021-01-21 DIAGNOSIS — I10 Essential (primary) hypertension: Secondary | ICD-10-CM

## 2021-01-21 LAB — BASIC METABOLIC PANEL
Anion gap: 11 (ref 5–15)
BUN: 39 mg/dL — ABNORMAL HIGH (ref 6–20)
CO2: 21 mmol/L — ABNORMAL LOW (ref 22–32)
Calcium: 8.7 mg/dL — ABNORMAL LOW (ref 8.9–10.3)
Chloride: 103 mmol/L (ref 98–111)
Creatinine, Ser: 1.62 mg/dL — ABNORMAL HIGH (ref 0.61–1.24)
GFR, Estimated: 52 mL/min — ABNORMAL LOW (ref >60)
Glucose, Bld: 129 mg/dL — ABNORMAL HIGH (ref 70–99)
Potassium: 3.7 mmol/L (ref 3.5–5.1)
Sodium: 135 mmol/L (ref 135–145)

## 2021-01-21 LAB — CBC
HCT: 39.5 % (ref 39.0–52.0)
Hemoglobin: 12.8 g/dL — ABNORMAL LOW (ref 13.0–17.0)
MCH: 29.6 pg (ref 26.0–34.0)
MCHC: 32.4 g/dL (ref 30.0–36.0)
MCV: 91.4 fL (ref 80.0–100.0)
Platelets: 232 10*3/uL (ref 150–400)
RBC: 4.32 MIL/uL (ref 4.22–5.81)
RDW: 14.7 % (ref 11.5–15.5)
WBC: 8 10*3/uL (ref 4.0–10.5)
nRBC: 0 % (ref 0.0–0.2)

## 2021-01-21 LAB — HEPARIN LEVEL (UNFRACTIONATED): Heparin Unfractionated: 0.43 IU/mL (ref 0.30–0.70)

## 2021-01-21 MED ORDER — METOPROLOL SUCCINATE ER 25 MG PO TB24
12.5000 mg | ORAL_TABLET | Freq: Every day | ORAL | Status: DC
  Administered 2021-01-21 – 2021-01-27 (×7): 12.5 mg via ORAL
  Filled 2021-01-21 (×7): qty 1

## 2021-01-21 MED ORDER — APIXABAN 5 MG PO TABS
10.0000 mg | ORAL_TABLET | Freq: Two times a day (BID) | ORAL | Status: DC
  Administered 2021-01-21 – 2021-01-27 (×13): 10 mg via ORAL
  Filled 2021-01-21 (×13): qty 2

## 2021-01-21 MED ORDER — APIXABAN 5 MG PO TABS: 5.0000 mg | ORAL_TABLET | Freq: Two times a day (BID) | ORAL | Status: DC

## 2021-01-21 MED ORDER — FUROSEMIDE 10 MG/ML IJ SOLN
40.0000 mg | Freq: Three times a day (TID) | INTRAMUSCULAR | Status: DC
  Administered 2021-01-21: 40 mg via INTRAVENOUS
  Filled 2021-01-21: qty 4

## 2021-01-21 MED ORDER — ATORVASTATIN CALCIUM 80 MG PO TABS
80.0000 mg | ORAL_TABLET | Freq: Every day | ORAL | Status: DC
  Administered 2021-01-21: 80 mg via ORAL
  Filled 2021-01-21 (×4): qty 1

## 2021-01-21 MED ORDER — LOSARTAN POTASSIUM 25 MG PO TABS
12.5000 mg | ORAL_TABLET | Freq: Every day | ORAL | Status: DC
  Administered 2021-01-21 – 2021-01-27 (×7): 12.5 mg via ORAL
  Filled 2021-01-21 (×7): qty 1

## 2021-01-21 MED ORDER — METOLAZONE 5 MG PO TABS
5.0000 mg | ORAL_TABLET | Freq: Once | ORAL | Status: AC
  Administered 2021-01-21: 5 mg via ORAL
  Filled 2021-01-21: qty 1

## 2021-01-21 MED ORDER — FUROSEMIDE 10 MG/ML IJ SOLN
60.0000 mg | Freq: Three times a day (TID) | INTRAMUSCULAR | Status: DC
  Administered 2021-01-21 – 2021-01-25 (×10): 60 mg via INTRAVENOUS
  Filled 2021-01-21 (×11): qty 6

## 2021-01-21 MED ORDER — TRAZODONE HCL 50 MG PO TABS: 50.0000 mg | ORAL_TABLET | Freq: Every evening | ORAL | Status: DC | PRN

## 2021-01-21 MED ORDER — POTASSIUM CHLORIDE CRYS ER 20 MEQ PO TBCR
40.0000 meq | EXTENDED_RELEASE_TABLET | Freq: Every day | ORAL | Status: DC
  Administered 2021-01-21 – 2021-01-25 (×5): 40 meq via ORAL
  Filled 2021-01-21 (×5): qty 2

## 2021-01-21 NOTE — Progress Notes (Signed)
PROGRESS NOTE    Joshua Cordova  GDJ:[id number] DOB: [birth year] DOA: 01/17/2021 PCP: Karie Schwalbe, MD    Brief Narrative:  51 year old gentleman with history of chronic congestive heart failure with ejection fraction less than 20%, hypertension, hyperlipidemia, diet-controlled diabetes, presented to the ER with shortness of breath ongoing for about a month.  In the emergency room, he was initially on 2 L oxygen.  CT angiogram showed left upper lobe and middle lobe segmental and subsegmental PE.  Patient was also noted to be grossly fluid overloaded.   Assessment & Plan:   Principal Problem:   Acute pulmonary embolism (HCC) Active Problems:   Essential hypertension   Asthma, allergic, mild intermittent, uncomplicated   Type II diabetes mellitus with renal manifestations (HCC)   Chronic systolic CHF (congestive heart failure) (HCC)   Elevated troponin   HLD (hyperlipidemia)   CKD (chronic kidney disease), stage IIIa   Mediastinal lymphadenopathy   Hematochezia   Hypotension  Acute pulmonary embolism: Patient with severe congestive heart failure.  This was probably due to congestive heart failure.  Ruled out intracardiac thrombus  Currently on heparin infusion.  We will continue heparin.  Will change to Eliquis on discharge or if cardiology did not plan any procedures. Lower extremity duplexes were negative for DVT.  Acute on chronic systolic congestive heart failure: Acute hypoxemic respiratory failure.  Presents with fluid overload and hypoxemia.  Echocardiogram with ejection fraction less than 20%.  Recent cardiac cath with no significant coronary artery disease.  Followed by cardiology as outpatient.  Blood pressure soft but tolerating IV Lasix 40 mg twice a day.   Currently unable to tolerate advanced heart failure therapies. Due to significant fluid overload, will ask cardiology for consultation. Continue intake output monitoring.  AKI on CKD stage IIIa: Improving with  diuresis.  Type 2 diabetes with CKD stage IIIa: Known A1c 6.8.  Diet controlled.  Not on treatment.  He will continue low-carb diet.  Essential hypertension: Currently  Intolerance to antihypertensives.  Will monitor.  Will need heart failure therapies to resume.   DVT prophylaxis: Place and maintain sequential compression device Start: 01/18/21 1113 Place TED hose Start: 01/18/21 1112   Code Status: Full code Family Communication: Mother at the bedside. Disposition Plan: Status is: Inpatient  Remains inpatient appropriate because: IV heparin, IV diuresis         Consultants:  Cardiology  Procedures:  None  Antimicrobials:  None   Subjective:  Patient seen and examined.  Difficulty fall asleep at night.  Breathing better.  Legs are still edematous.  Gets out of breath on walking to the bathroom.  Objective: Vitals:   01/20/21 1949 01/20/21 2352 01/21/21 0426 01/21/21 0730  BP: 106/88 109/84 (!) 112/94 115/90  Pulse: 96 95 (!) 104 95  Resp: 18 18 18 18   Temp: 97.6 F (36.4 C) 98.7 F (37.1 C) 97.8 F (36.6 C) 98.1 F (36.7 C)  TempSrc: Oral   Oral  SpO2: 93% 94% 99% 98%  Weight:      Height:        Intake/Output Summary (Last 24 hours) at 01/21/2021 1100 Last data filed at 01/21/2021 0950 Gross per 24 hour  Intake 1166.47 ml  Output 1950 ml  Net -783.53 ml   Filed Weights   01/17/21 0033 01/19/21 0902  Weight: 95.7 kg 95.2 kg    Examination:  General: Chronically sick looking.  Looks fairly comfortable on supplemental oxygen. Able to get around in the room with minimal oxygen.  Cardiovascular: S1-S2 normal.  Regular rate rhythm. Respiratory: Poor air entry at bases.  No added sounds.   Gastrointestinal: Soft and nontender.  Bowel sound present. Ext: 2+ edema mostly on the dorsum of the feet.     Data Reviewed: I have personally reviewed following labs and imaging studies  CBC: Recent Labs  Lab 01/18/21 1015 01/19/21 0025 01/19/21 0533  01/20/21 0626 01/21/21 0832  WBC 7.7 7.9 8.0 7.5 8.0  NEUTROABS  --   --   --  4.9  --   HGB 12.8* 13.2 12.3* 12.1* 12.8*  HCT 38.9* 40.0 36.6* 36.2* 39.5  MCV 91.3 90.3 90.8 90.5 91.4  PLT 223 221 192 215 [id number]   Basic Metabolic Panel: Recent Labs  Lab 01/17/21 0041 01/20/21 0626 01/21/21 0832  NA 132* 136 135  K 3.5 3.5 3.7  CL 103 104 103  CO2 20* 22 21*  GLUCOSE 150* 123* 129*  BUN 34* 40* 39*  CREATININE 1.57* 1.83* 1.62*  CALCIUM 8.6* 8.3* 8.7*  MG  --  2.2  --   PHOS  --  4.3  --    GFR: Estimated Creatinine Clearance: 65 mL/min (A) (by C-G formula based on SCr of 1.62 mg/dL (H)). Liver Function Tests: Recent Labs  Lab 01/17/21 0253  AST 27  ALT 20  ALKPHOS 89  BILITOT 1.4*  PROT 7.8  ALBUMIN 3.6   Recent Labs  Lab 01/17/21 0253  LIPASE 45   No results for input(s): AMMONIA in the last 168 hours. Coagulation Profile: Recent Labs  Lab 01/17/21 0253  INR 1.1   Cardiac Enzymes: No results for input(s): CKTOTAL, CKMB, CKMBINDEX, TROPONINI in the last 168 hours. BNP (last 3 results) No results for input(s): PROBNP in the last 8760 hours. HbA1C: No results for input(s): HGBA1C in the last 72 hours.  CBG: No results for input(s): GLUCAP in the last 168 hours. Lipid Profile: No results for input(s): CHOL, HDL, LDLCALC, TRIG, CHOLHDL, LDLDIRECT in the last 72 hours.  Thyroid Function Tests: No results for input(s): TSH, T4TOTAL, FREET4, T3FREE, THYROIDAB in the last 72 hours. Anemia Panel: No results for input(s): VITAMINB12, FOLATE, FERRITIN, TIBC, IRON, RETICCTPCT in the last 72 hours. Sepsis Labs: Recent Labs  Lab 01/17/21 0041  PROCALCITON <0.10    Recent Results (from the past 240 hour(s))  Resp Panel by RT-PCR (Flu A&B, Covid) Nasopharyngeal Swab     Status: None   Collection Time: 01/17/21  2:53 AM   Specimen: Nasopharyngeal Swab; Nasopharyngeal(NP) swabs in vial transport medium  Result Value Ref Range Status   SARS Coronavirus 2 by  RT PCR NEGATIVE NEGATIVE Final    Comment: (NOTE) SARS-CoV-2 target nucleic acids are NOT DETECTED.  The SARS-CoV-2 RNA is generally detectable in upper respiratory specimens during the acute phase of infection. The lowest concentration of SARS-CoV-2 viral copies this assay can detect is 138 copies/mL. A negative result does not preclude SARS-Cov-2 infection and should not be used as the sole basis for treatment or other patient management decisions. A negative result may occur with  improper specimen collection/handling, submission of specimen other than nasopharyngeal swab, presence of viral mutation(s) within the areas targeted by this assay, and inadequate number of viral copies(<138 copies/mL). A negative result must be combined with clinical observations, patient history, and epidemiological information. The expected result is Negative.  Fact Sheet for Patients:  EntrepreneurPulse.com.au  Fact Sheet for Healthcare Providers:  IncredibleEmployment.be  This test is no t yet approved or cleared by the  [place] [employer]ural [employer] and  has been authorized for detection and/or diagnosis of SARS-CoV-2 by FDA under an Print production planner (EUA). This EUA will remain  in effect (meaning this test can be used) for the duration of the COVID-19 declaration under Section 564(b)(1) of the Act, 21 U.S.C.section 360bbb-3(b)(1), unless the authorization is terminated  or revoked sooner.       Influenza A by PCR NEGATIVE NEGATIVE Final   Influenza B by PCR NEGATIVE NEGATIVE Final    Comment: (NOTE) The Xpert Xpress SARS-CoV-2/FLU/RSV plus assay is intended as an aid in the diagnosis of influenza from Nasopharyngeal swab specimens and should not be used as a sole basis for treatment. Nasal washings and aspirates are unacceptable for Xpert Xpress SARS-CoV-2/FLU/RSV testing.  Fact Sheet for Patients: EntrepreneurPulse.com.au  Fact Sheet  for Healthcare Providers: IncredibleEmployment.be  This test is not yet approved or cleared by the [place] FDA and has been authorized for detection and/or diagnosis of SARS-CoV-2 by FDA under an Emergency Use Authorization (EUA). This EUA will remain in effect (meaning this test can be used) for the duration of the COVID-19 declaration under Section 564(b)(1) of the Act, 21 U.S.C. section 360bbb-3(b)(1), unless the authorization is terminated or revoked.  Performed at [facility], [address], [place], [place] [address]          Radiology Studies: ECHOCARDIOGRAM COMPLETE  Result Date: 01/19/2021    ECHOCARDIOGRAM REPORT   Patient Name:   Joshua Cordova Date of Exam: 01/19/2021 Medical Rec #:  [id number]   Height:       71.0 in Accession #:    [id number]  Weight:       209.8 lb Date of Birth:  Jul 27, 1970  BSA:          2.152 m Patient Age:    59 years    BP:           103/82 mmHg Patient Gender: M           HR:           94 bpm. Exam Location:  [facility] Procedure: 2D Echo, Cardiac Doppler and Color Doppler Indications:     CHF I50.9  History:         Patient has prior history of Echocardiogram examinations, most                  recent 10/31/2020. Risk Factors:Hypertension. Chronic systolic                  heart failure, Non-ischemic cardiomyopathy.  Sonographer:     Sherrie Sport Referring Phys:  [id number] Barb Merino Diagnosing Phys: Nelva Bush MD IMPRESSIONS  1. Left ventricular ejection fraction, by estimation, is 20 to 25%. The left ventricle has severely decreased function. The left ventricle demonstrates global hypokinesis. The left ventricular internal cavity size was severely dilated. Left ventricular diastolic parameters are consistent with Grade II diastolic dysfunction (pseudonormalization). Elevated left atrial pressure.  2. Right ventricular systolic function is moderately reduced. The right ventricular size is moderately enlarged. There is severely  elevated pulmonary artery systolic pressure.  3. Left atrial size was severely dilated.  4. Right atrial size was severely dilated.  5. The mitral valve is abnormal. Moderate to severe mitral valve regurgitation.  6. Tricuspid valve regurgitation is moderate.  7. The aortic valve is tricuspid. Aortic valve regurgitation is mild. No aortic stenosis is present.  8. The inferior vena cava is dilated in size with <50% respiratory variability,  suggesting right atrial pressure of 15 mmHg. FINDINGS  Left Ventricle: Left ventricular ejection fraction, by estimation, is 20 to 25%. The left ventricle has severely decreased function. The left ventricle demonstrates global hypokinesis. The left ventricular internal cavity size was severely dilated. There is no left ventricular hypertrophy. Left ventricular diastolic parameters are consistent with Grade II diastolic dysfunction (pseudonormalization). Elevated left atrial pressure. Right Ventricle: The right ventricular size is moderately enlarged. No increase in right ventricular wall thickness. Right ventricular systolic function is moderately reduced. There is severely elevated pulmonary artery systolic pressure. The tricuspid regurgitant velocity is 4.02 m/s, and with an assumed right atrial pressure of 15 mmHg, the estimated right ventricular systolic pressure is 123456 mmHg. Left Atrium: Left atrial size was severely dilated. Right Atrium: Right atrial size was severely dilated. Pericardium: There is no evidence of pericardial effusion. Mitral Valve: The mitral valve is abnormal. There is mild thickening of the mitral valve leaflet(s). Moderate to severe mitral valve regurgitation. Tricuspid Valve: The tricuspid valve is not well visualized. Tricuspid valve regurgitation is moderate. Aortic Valve: The aortic valve is tricuspid. Aortic valve regurgitation is mild. No aortic stenosis is present. Aortic valve mean gradient measures 2.0 mmHg. Aortic valve peak gradient measures  2.5 mmHg. Aortic valve area, by VTI measures 2.29 cm. Pulmonic Valve: The pulmonic valve was normal in structure. Pulmonic valve regurgitation is trivial. No evidence of pulmonic stenosis. Aorta: The aortic root is normal in size and structure. Pulmonary Artery: The pulmonary artery is not well seen. Venous: The inferior vena cava is dilated in size with less than 50% respiratory variability, suggesting right atrial pressure of 15 mmHg. IAS/Shunts: The interatrial septum was not well visualized.  LEFT VENTRICLE PLAX 2D LVIDd:         7.80 cm      Diastology LVIDs:         7.40 cm      LV e' medial:   4.13 cm/s LV PW:         0.70 cm      LV E/e' medial: 21.7 LV IVS:        0.90 cm LVOT diam:     2.10 cm LV SV:         19 LV SV Index:   9 LVOT Area:     3.46 cm  LV Volumes (MOD) LV vol d, MOD A2C: 345.0 ml LV vol d, MOD A4C: 309.0 ml LV vol s, MOD A2C: 258.0 ml LV vol s, MOD A4C: 205.0 ml LV SV MOD A2C:     87.0 ml LV SV MOD A4C:     309.0 ml LV SV MOD BP:      100.1 ml RIGHT VENTRICLE RV Basal diam:  4.90 cm LEFT ATRIUM              Index        RIGHT ATRIUM           Index LA diam:        5.80 cm  2.70 cm/m   RA Area:     32.00 cm LA Vol (A2C):   144.0 ml 66.92 ml/m  RA Volume:   118.00 ml 54.84 ml/m LA Vol (A4C):   104.0 ml 48.33 ml/m LA Biplane Vol: 124.0 ml 57.62 ml/m  AORTIC VALVE                    PULMONIC VALVE AV Area (Vmax):    1.72 cm  PV Vmax:          0.35 m/s AV Area (Vmean):   1.51 cm     PV Vmean:         23.300 cm/s AV Area (VTI):     2.29 cm     PV VTI:           0.036 m AV Vmax:           78.90 cm/s   PV Peak grad:     0.5 mmHg AV Vmean:          58.400 cm/s  PV Mean grad:     0.0 mmHg AV VTI:            0.081 m      PR End Diast Vel: 13.99 msec AV Peak Grad:      2.5 mmHg     RVOT Peak grad:   1 mmHg AV Mean Grad:      2.0 mmHg LVOT Vmax:         39.20 cm/s LVOT Vmean:        25.500 cm/s LVOT VTI:          0.054 m LVOT/AV VTI ratio: 0.66  AORTA Ao Root diam: 3.27 cm MITRAL VALVE                TRICUSPID VALVE MV Area (PHT): 8.25 cm    TR Peak grad:   64.6 mmHg MV Decel Time: 92 msec     TR Vmax:        402.00 cm/s MV E velocity: 89.50 cm/s MV A velocity: 45.00 cm/s  SHUNTS MV E/A ratio:  1.99        Systemic VTI:  0.05 m                            Systemic Diam: 2.10 cm                            Pulmonic VTI:  0.072 m Harrell Gave End MD Electronically signed by Nelva Bush MD Signature Date/Time: 01/19/2021/4:45:21 PM    Final         Scheduled Meds:  dextromethorphan-guaiFENesin  1 tablet Oral BID   furosemide  40 mg Intravenous Q12H   oxyCODONE  5 mg Oral Once   Continuous Infusions:  heparin 1,600 Units/hr (01/21/21 0403)     LOS: 4 days    Time spent: 3 35 minutes    Barb Merino, MD [facility] Pager [phone]

## 2021-01-21 NOTE — Consult Note (Signed)
Cardiology Consultation:   Patient ID: Joshua Cordova; UG:[id number]; [birth year]   Admit date: 01/17/2021 Date of Consult: 01/21/2021  Primary Care Provider: Venia Carbon, MD Primary Cardiologist: Rockey Situ Primary Electrophysiologist:  None   Patient Profile:   [name] is a 51 y.o. 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 Sloan Leiter.  History of Present Illness:   Mr. Hommes underwent echo in 04/2018 that showed an EF of 25-30%, severely dilated LV internal cavity size, elevated mean left atrial pressure, normal RVSF and cavity size, moderately increased PASP estimated at 68.3 mmHg, moderately dilated left atrium, moderate mitral regurgitation, mild to moderate tricuspid regurgitation. LHC in 10/2019 that showed nonobstructive CAD of the LAD and LCx with moderately elevated right heart pressures. Echo in 10/2019 showed and EF of < 20%, global hypokinesis, severely dilated LV internal cavity size, Gr2DD, normal RVSF and ventricular cavity size, severely dilated left atrium, mildly dilated right atrium, moderate mitral regurgitation, mild aortic valve sclerosis, and an estimated right atrial pressure of 15 mmHg. Escalation of GDMT has been precluded by financial constraints and BP.   He was admitted to the hospital on 01/17/2021 with a month long history of progressive dyspnea, weight gain, lower extremity swelling, and abdominal distension. Initial labs were notable for a troponin of 47 with an unchanged delta troponin, BNP 761 and d-dimer 1.54. CTA chest notable for left lower lobe segmental and subsegmental emboli with few suspected left upper lobe subsegmental emboli with an overall small clot burden, moderate panchamber cardiomegaly  with venous distension, pulmonary arterial prominence, IVC reflux, small pericardial effusion with heavy three-vessel coronary artery calcifications. Please see full report for further details. Lower extremity ultrasound negative for DVT bilaterally. He was placed on a heparin gtt along with IV Lasix 40 mg bid. SCr trended from 1.57 to 1.83 with a current value of 1.62. PTA metoprolol and losartan have been held. Documented UOP 1.6 L for the admission. Initially, he reported vigorous UOP with IV Lasix, though over the past 24 hours he feels like this has decreased. He is without chest pain and remains volume up as well as SOB. Echo this admission showed an EF of 20-25%, global hypokinesis, severely dilated LV internal cavity size, Gr2DD, moderately reduced RVSF with moderately enlarged ventricular cavity size, PASP severely elevated at 79.6 mmHg, severe biatrial enlargement, moderate to severe mitral regurgitation, moderate tricuspid regurgitation, and an estimated right atrial pressure of 15 mmHg.    Past Medical History:  Diagnosis Date   Chronic systolic heart failure (HCC)    Hypertension    NICM (nonischemic cardiomyopathy) ([place])     Past Surgical History:  Procedure Laterality Date   CARDIAC CATHETERIZATION     RIGHT/LEFT HEART CATH AND CORONARY ANGIOGRAPHY N/A 11/01/2019   Procedure: RIGHT/LEFT HEART CATH AND CORONARY ANGIOGRAPHY;  Surgeon: Minna Merritts, MD;  Location: [facility];  Service: Cardiovascular;  Laterality: N/A;     Home Meds: Prior to Admission medications   Medication Sig Start Date End Date Taking? Authorizing Provider  losartan (COZAAR) 25 MG tablet TAKE ONE TABLET BY MOUTH DAILY 11/17/20  Yes Gollan, Kathlene November, MD  metoprolol tartrate (LOPRESSOR) 25 MG tablet Take 12.5 mg by mouth daily.   Yes [provider]  albuterol (VENTOLIN HFA) 108 (90 Base) MCG/ACT inhaler TAKE 2 PUFFS BY MOUTH EVERY 6 HOURS AS NEEDED FOR WHEEZE OR SHORTNESS OF BREATH  11/13/20   Venia Carbon, MD  atorvastatin (LIPITOR) 40 MG tablet Take 1 tablet (40 mg total) by mouth daily. Patient not taking: Reported on 01/17/2021 12/15/20   Venia Carbon, MD  furosemide (LASIX) 80 MG tablet Take 1 tablet (80 mg total) by mouth daily. 07/27/20   Minna Merritts, MD    Inpatient Medications: Scheduled Meds:  dextromethorphan-guaiFENesin  1 tablet Oral BID   furosemide  40 mg Intravenous Q12H   oxyCODONE  5 mg Oral Once   Continuous Infusions:  heparin 1,600 Units/hr (01/21/21 0403)   PRN Meds: acetaminophen, albuterol, benzonatate, dextromethorphan-guaiFENesin, hydrALAZINE, ondansetron (ZOFRAN) IV  Allergies:  No Known Allergies  Social History:   Social History   Socioeconomic History   Marital status: Single    Spouse name: Not on file   Number of children: Not on file   Years of education: Not on file   Highest education level: Not on file  Occupational History   Occupation: Delivering auto parts    Comment: [employer]   Occupation:    Tobacco Use   Smoking status: Former    Packs/day: 1.00    Types: Cigarettes    Quit date: 2018    Years since quitting: 5.0   Smokeless tobacco: Never  Vaping Use   Vaping Use: Never used  Substance and Sexual Activity   Alcohol use: No   Drug use: Never   Sexual activity: Not on file  Other Topics Concern   Not on file  Social History Narrative   Not on file   Social Determinants of Health   Financial Resource Strain: Not on file  Food Insecurity: Not on file  Transportation Needs: Not on file  Physical Activity: Not on file  Stress: Not on file  Social Connections: Not on file  Intimate Partner Violence: Not on file     Family History:   Family History  Problem Relation Age of Onset   Diabetes Mother    Hypertension Father    Heart disease Father    Diabetes Maternal Grandfather    Heart disease Other     ROS:  Review of Systems  Constitutional:  Positive for  malaise/fatigue. Negative for chills, diaphoresis, fever and weight loss.  HENT:  Negative for congestion.   Eyes:  Negative for discharge and redness.  Respiratory:  Positive for cough, sputum production and shortness of breath. Negative for wheezing.        Cough initially productive of thick yellow sputum  Cardiovascular:  Positive for leg swelling. Negative for chest pain, palpitations, orthopnea, claudication and PND.  Gastrointestinal:  Negative for abdominal pain, blood in stool, heartburn, melena, nausea and vomiting.       Abdominal distension   Musculoskeletal:  Negative for falls and myalgias.  Skin:  Negative for rash.  Neurological:  Positive for weakness. Negative for dizziness, tingling, tremors, sensory change, speech change, focal weakness and loss of consciousness.  Endo/Heme/Allergies:  Does not bruise/bleed easily.  Psychiatric/Behavioral:  Negative for substance abuse. The patient is not nervous/anxious.   All other systems reviewed and are negative.    Physical Exam/Data:   Vitals:   01/20/21 1949 01/20/21 2352 01/21/21 0426 01/21/21 0730  BP: 106/88 109/84 (!) 112/94 115/90  Pulse: 96 95 (!) 104 95  Resp: 18 18 18 18   Temp: 97.6 F (  36.4 C) 98.7 F (37.1 C) 97.8 F (36.6 C) 98.1 F (36.7 C)  TempSrc: Oral   Oral  SpO2: 93% 94% 99% 98%  Weight:      Height:        Intake/Output Summary (Last 24 hours) at 01/21/2021 1055 Last data filed at 01/21/2021 0950 Gross per 24 hour  Intake 1166.47 ml  Output 1950 ml  Net -783.53 ml   Filed Weights   01/17/21 0033 01/19/21 0902  Weight: 95.7 kg 95.2 kg   Body mass index is 29.26 kg/m.   Physical Exam: General: Well developed, well nourished, in no acute distress. Head: Normocephalic, atraumatic, sclera non-icteric, no xanthomas, nares without discharge.  Neck: Negative for carotid bruits. JVD elevated to the angle of the mandible. Lungs: Diminished breath sounds along the bilateral bases. Breathing is  unlabored. Heart: RRR with S1 S2. No murmurs, rubs, or gallops appreciated. Abdomen: Soft, non-tender, distended with normoactive bowel sounds. No hepatomegaly. No rebound/guarding. No obvious abdominal masses. Msk:  Strength and tone appear normal for age. Extremities: No clubbing or cyanosis. 2+ bilateral lower extremity edema. Distal pedal pulses are 2+ and equal bilaterally. Neuro: Alert and oriented X 3. No facial asymmetry. No focal deficit. Moves all extremities spontaneously. Psych:  Responds to questions appropriately with a normal affect.   EKG:  The EKG was personally reviewed and demonstrates: sinus tachycardia, 107 bpm, LVH with nonspecific IVCD Telemetry:  Telemetry was personally reviewed and demonstrates: sinus rhythm with sinus tachycardia, PVCs  Weights: Filed Weights   01/17/21 0033 01/19/21 0902  Weight: 95.7 kg 95.2 kg    Relevant CV Studies:  2D echo 01/19/2021: 1. Left ventricular ejection fraction, by estimation, is 20 to 25%. The  left ventricle has severely decreased function. The left ventricle  demonstrates global hypokinesis. The left ventricular internal cavity size  was severely dilated. Left ventricular  diastolic parameters are consistent with Grade II diastolic dysfunction  (pseudonormalization). Elevated left atrial pressure.   2. Right ventricular systolic function is moderately reduced. The right  ventricular size is moderately enlarged. There is severely elevated  pulmonary artery systolic pressure.   3. Left atrial size was severely dilated.   4. Right atrial size was severely dilated.   5. The mitral valve is abnormal. Moderate to severe mitral valve  regurgitation.   6. Tricuspid valve regurgitation is moderate.   7. The aortic valve is tricuspid. Aortic valve regurgitation is mild. No  aortic stenosis is present.   8. The inferior vena cava is dilated in size with <50% respiratory  variability, suggesting right atrial pressure of 15 mmHg.    Laboratory Data:  Chemistry Recent Labs  Lab 01/17/21 0041 01/20/21 0626 01/21/21 0832  NA 132* 136 135  K 3.5 3.5 3.7  CL 103 104 103  CO2 20* 22 21*  GLUCOSE 150* 123* 129*  BUN 34* 40* 39*  CREATININE 1.57* 1.83* 1.62*  CALCIUM 8.6* 8.3* 8.7*  GFRNONAA 54* 45* 52*  ANIONGAP 9 10 11     Recent Labs  Lab 01/17/21 0253  PROT 7.8  ALBUMIN 3.6  AST 27  ALT 20  ALKPHOS 89  BILITOT 1.4*   Hematology Recent Labs  Lab 01/19/21 0533 01/20/21 0626 01/21/21 0832  WBC 8.0 7.5 8.0  RBC 4.03* 4.00* 4.32  HGB 12.3* 12.1* 12.8*  HCT 36.6* 36.2* 39.5  MCV 90.8 90.5 91.4  MCH 30.5 30.3 29.6  MCHC 33.6 33.4 32.4  RDW 14.3 14.6 14.7  PLT 192 215 232  Cardiac EnzymesNo results for input(s): TROPONINI in the last 168 hours. No results for input(s): TROPIPOC in the last 168 hours.  BNP Recent Labs  Lab 01/17/21 0041  BNP 761.9*    DDimer  Recent Labs  Lab 01/17/21 0259  DDIMER 1.54*    Radiology/Studies:       Assessment and Plan:   1. Biventricular failure with acute on chronic combined systolic and diastolic CHF and pulmonary hypertension with history of dilated NICM: -He is significantly volume overloaded -Titrate IV Lasix to 40 mg tid with possible addition of metolazone and/or further titration of IV Lasix, tomorrow pending UOP and renal function -Restart low dose losartan and Toprol XL -Escalate GDMT as able, underlying CKD precludes addition of MRA or digoxin at this time -Will need close monitoring of renal function -Daily weights -Strict I/O -May need medication management  2. Nonobstructive CAD with elevated high sensitivity troponin: -Mildly elevated peaking at 47 -Not consistent with ACS -Likely supply demand ischemia in the setting of volume overload, PE, and CKD -LDL 103 -Titrate Lipitor to 80 mg daily -No plans for inpatient ischemic evaluation  -Follow up as outpatient   3. PE: -IV heparin has been changed over to  Eliquis -Hypercoagulable workup pending -Outpatient follow up with PCP  4. Acute on CKD stage III: -Improving today with diuresis -Monitor        For questions or updates, please contact [place] Please consult [email or web address] for contact info under Cardiology/STEMI.   Signed, Christell Faith, PA-C [place] Pager: [phone] 01/21/2021, 10:55 AM

## 2021-01-21 NOTE — Progress Notes (Signed)
ANTICOAGULATION CONSULT NOTE   Pharmacy Consult for heparin infusion Indication: pulmonary embolus  No Known Allergies  Patient Measurements: Height: 5\' 11"  (180.3 cm) Weight: 95.2 kg (209 lb 12.8 oz) IBW/kg (Calculated) : 75.3 Heparin Dosing Weight: 94.1 kg  Vital Signs: Temp: 97.8 F (36.6 C) (01/05 0426) Temp Source: Oral (01/04 1949) BP: 112/94 (01/05 0426) Pulse Rate: 104 (01/05 0426)  Labs: Recent Labs    01/19/21 0025 01/19/21 0047 01/19/21 0533 01/20/21 0626  HGB 13.2  --  12.3* 12.1*  HCT 40.0  --  36.6* 36.2*  PLT 221  --  192 215  HEPARINUNFRC  --   --  0.39 0.51  CREATININE  --   --   --  1.83*  TROPONINIHS  --  38*  --   --      Estimated Creatinine Clearance: 57.5 mL/min (A) (by C-G formula based on SCr of 1.83 mg/dL (H)).   Medical History: Past Medical History:  Diagnosis Date   Chronic systolic heart failure (HCC)    Hypertension    NICM (nonischemic cardiomyopathy) (HCC)    Assessment: Pt is 51 yo male presenting to ED c/o SOB being treated for "left lower lobe segmental and subsegmental emboli, few suspected left upper lobe subsegmental emboli."  1/1 @1252  HL= 0.50     Therapeutic  1/1 @2145  HL=0.37      Therapeutic x 2 1/2 @0633  HL=0.39      Therapeutic  1/3 @ 0533 HL= 0.39    Therapeutic 1/4 @0626  HL = 0.51     Therapeutic 1/5 @0832  HL = 0.43    Therapeutic   Goal of Therapy:  Heparin level 0.3-0.7 units/ml Monitor platelets by anticoagulation protocol: Yes   Plan:  HL remains therapeutic at current rate, Hgb and Plt stable as well Continue heparin infusion at 1600 units/hr Check HL with AM labs CBC daily while on heparin  54, PharmD Clinical Pharmacist  01/21/2021 7:08 AM

## 2021-01-22 DIAGNOSIS — I5022 Chronic systolic (congestive) heart failure: Secondary | ICD-10-CM

## 2021-01-22 DIAGNOSIS — I2609 Other pulmonary embolism with acute cor pulmonale: Secondary | ICD-10-CM

## 2021-01-22 LAB — FACTOR 5 LEIDEN

## 2021-01-22 LAB — CBC
HCT: 40.7 % (ref 39.0–52.0)
Hemoglobin: 13.5 g/dL (ref 13.0–17.0)
MCH: 30.5 pg (ref 26.0–34.0)
MCHC: 33.2 g/dL (ref 30.0–36.0)
MCV: 92.1 fL (ref 80.0–100.0)
Platelets: 240 10*3/uL (ref 150–400)
RBC: 4.42 MIL/uL (ref 4.22–5.81)
RDW: 14.9 % (ref 11.5–15.5)
WBC: 8.9 10*3/uL (ref 4.0–10.5)
nRBC: 0 % (ref 0.0–0.2)

## 2021-01-22 LAB — BASIC METABOLIC PANEL
Anion gap: 9 (ref 5–15)
BUN: 40 mg/dL — ABNORMAL HIGH (ref 6–20)
CO2: 24 mmol/L (ref 22–32)
Calcium: 8.9 mg/dL (ref 8.9–10.3)
Chloride: 102 mmol/L (ref 98–111)
Creatinine, Ser: 1.56 mg/dL — ABNORMAL HIGH (ref 0.61–1.24)
GFR, Estimated: 54 mL/min — ABNORMAL LOW (ref >60)
Glucose, Bld: 141 mg/dL — ABNORMAL HIGH (ref 70–99)
Potassium: 4 mmol/L (ref 3.5–5.1)
Sodium: 135 mmol/L (ref 135–145)

## 2021-01-22 LAB — HEPARIN LEVEL (UNFRACTIONATED): Heparin Unfractionated: >1.1 IU/mL — ABNORMAL HIGH (ref 0.30–0.70)

## 2021-01-22 MED ORDER — METHOCARBAMOL 500 MG PO TABS
500.0000 mg | ORAL_TABLET | Freq: Once | ORAL | Status: DC
  Filled 2021-01-22: qty 1

## 2021-01-22 MED ORDER — DAPAGLIFLOZIN PROPANEDIOL 10 MG PO TABS
10.0000 mg | ORAL_TABLET | Freq: Every day | ORAL | Status: DC
  Administered 2021-01-23 – 2021-01-26 (×4): 10 mg via ORAL
  Filled 2021-01-22 (×6): qty 1

## 2021-01-22 NOTE — Progress Notes (Signed)
PROGRESS NOTE    Joshua Cordova  [id number] DOB: 09/02/1970 DOA: 01/17/2021 PCP: Venia Carbon, MD    Brief Narrative:  51 year old gentleman with history of chronic congestive heart failure with ejection fraction less than 20%, hypertension, hyperlipidemia, diet-controlled diabetes, presented to the ER with shortness of breath ongoing for about a month.  In the emergency room, he was initially on 2 L oxygen.  CT angiogram showed left upper lobe and middle lobe segmental and subsegmental PE.  Patient was also noted to be grossly fluid overloaded.   Assessment & Plan:   Principal Problem:   Acute pulmonary embolism ([place]) Active Problems:   Essential hypertension   Asthma, allergic, mild intermittent, uncomplicated   Type II diabetes mellitus with renal manifestations (HCC)   Chronic systolic CHF (congestive heart failure) (HCC)   Elevated troponin   HLD (hyperlipidemia)   CKD (chronic kidney disease), stage IIIa   Mediastinal lymphadenopathy   Hematochezia   Hypotension  Acute pulmonary embolism:  Treated with heparin drip.  Converting to Eliquis.   Lower extremity duplexes were negative for DVT.  Acute on chronic systolic congestive heart failure: Acute hypoxemic respiratory failure.  Presented with fluid overload and hypoxemia.  Echocardiogram with ejection fraction less than 20%.  Recent cardiac cath with no significant coronary artery disease.  Followed by cardiology as outpatient.   Continues to be volume overloaded. Currently on Lasix 60 mg 3 times a day, losartan 12.5 mg, metoprolol 12.5 mg.  Potassium is adequate.  Followed by cardiology.  AKI on CKD stage IIIa: Improving with diuresis.  Type 2 diabetes with CKD stage IIIa: Known A1c 6.8.  Diet controlled.  Not on treatment.  He will continue low-carb diet.  Essential hypertension: Stable.  Tolerating heart failure therapies   DVT prophylaxis: Place and maintain sequential compression device Start: 01/18/21  1113 Place TED hose Start: 01/18/21 1112 apixaban (ELIQUIS) tablet 10 mg  apixaban (ELIQUIS) tablet 5 mg   Code Status: Full code Family Communication: None today. Disposition Plan: Status is: Inpatient  Remains inpatient appropriate because: IV diuresis         Consultants:  Cardiology  Procedures:  None  Antimicrobials:  None   Subjective:  Patient seen and examined.  Tired with late night medications and frequent bathroom last night.  Breathing slightly better.  Legs are extremely edematous. Urine output 1400 mL last 24 hours.  Weight is not accurate and not changed.  Objective: Vitals:   01/21/21 2338 01/22/21 0646 01/22/21 0700 01/22/21 1201  BP: 118/83 108/87  117/84  Pulse: 97 90  87  Resp: 16   16  Temp: 97.8 F (36.6 C) 97.7 F (36.5 C)  98.6 F (37 C)  TempSrc: Oral Oral  Oral  SpO2: 94% 97%  95%  Weight:   95.9 kg   Height:        Intake/Output Summary (Last 24 hours) at 01/22/2021 1236 Last data filed at 01/22/2021 0950 Gross per 24 hour  Intake 1555 ml  Output 1400 ml  Net 155 ml   Filed Weights   01/17/21 0033 01/19/21 0902 01/22/21 0700  Weight: 95.7 kg 95.2 kg 95.9 kg    Examination:  General: Sitting in chair.  On room air today.  Anxious.   Able to get around in the room with minimal oxygen. Cardiovascular: S1-S2 normal.  Regular rate rhythm. Respiratory: Poor air entry at bases.  No added sounds.   Gastrointestinal: Soft and nontender.  Bowel sound present. Ext: 2+ edema mostly  on the dorsum of the feet.     Data Reviewed: I have personally reviewed following labs and imaging studies  CBC: Recent Labs  Lab 01/19/21 0025 01/19/21 0533 01/20/21 0626 01/21/21 0832 01/22/21 0807  WBC 7.9 8.0 7.5 8.0 8.9  NEUTROABS  --   --  4.9  --   --   HGB 13.2 12.3* 12.1* 12.8* 13.5  HCT 40.0 36.6* 36.2* 39.5 40.7  MCV 90.3 90.8 90.5 91.4 92.1  PLT 221 192 215 232 [id number]   Basic Metabolic Panel: Recent Labs  Lab 01/17/21 0041  01/20/21 0626 01/21/21 0832 01/22/21 0807  NA 132* 136 135 135  K 3.5 3.5 3.7 4.0  CL 103 104 103 102  CO2 20* 22 21* 24  GLUCOSE 150* 123* 129* 141*  BUN 34* 40* 39* 40*  CREATININE 1.57* 1.83* 1.62* 1.56*  CALCIUM 8.6* 8.3* 8.7* 8.9  MG  --  2.2  --   --   PHOS  --  4.3  --   --    GFR: Estimated Creatinine Clearance: 67.7 mL/min (A) (by C-G formula based on SCr of 1.56 mg/dL (H)). Liver Function Tests: Recent Labs  Lab 01/17/21 0253  AST 27  ALT 20  ALKPHOS 89  BILITOT 1.4*  PROT 7.8  ALBUMIN 3.6   Recent Labs  Lab 01/17/21 0253  LIPASE 45   No results for input(s): AMMONIA in the last 168 hours. Coagulation Profile: Recent Labs  Lab 01/17/21 0253  INR 1.1   Cardiac Enzymes: No results for input(s): CKTOTAL, CKMB, CKMBINDEX, TROPONINI in the last 168 hours. BNP (last 3 results) No results for input(s): PROBNP in the last 8760 hours. HbA1C: No results for input(s): HGBA1C in the last 72 hours.  CBG: No results for input(s): GLUCAP in the last 168 hours. Lipid Profile: No results for input(s): CHOL, HDL, LDLCALC, TRIG, CHOLHDL, LDLDIRECT in the last 72 hours.  Thyroid Function Tests: No results for input(s): TSH, T4TOTAL, FREET4, T3FREE, THYROIDAB in the last 72 hours. Anemia Panel: No results for input(s): VITAMINB12, FOLATE, FERRITIN, TIBC, IRON, RETICCTPCT in the last 72 hours. Sepsis Labs: Recent Labs  Lab 01/17/21 0041  PROCALCITON <0.10    Recent Results (from the past 240 hour(s))  Resp Panel by RT-PCR (Flu A&B, Covid) Nasopharyngeal Swab     Status: None   Collection Time: 01/17/21  2:53 AM   Specimen: Nasopharyngeal Swab; Nasopharyngeal(NP) swabs in vial transport medium  Result Value Ref Range Status   SARS Coronavirus 2 by RT PCR NEGATIVE NEGATIVE Final    Comment: (NOTE) SARS-CoV-2 target nucleic acids are NOT DETECTED.  The SARS-CoV-2 RNA is generally detectable in upper respiratory specimens during the acute phase of infection.  The lowest concentration of SARS-CoV-2 viral copies this assay can detect is 138 copies/mL. A negative result does not preclude SARS-Cov-2 infection and should not be used as the sole basis for treatment or other patient management decisions. A negative result may occur with  improper specimen collection/handling, submission of specimen other than nasopharyngeal swab, presence of viral mutation(s) within the areas targeted by this assay, and inadequate number of viral copies(<138 copies/mL). A negative result must be combined with clinical observations, patient history, and epidemiological information. The expected result is Negative.  Fact Sheet for Patients:  EntrepreneurPulse.com.au  Fact Sheet for Healthcare Providers:  IncredibleEmployment.be  This test is no t yet approved or cleared by the [place] FDA and  has been authorized for detection and/or diagnosis of SARS-CoV-2 by  FDA under an Emergency Use Authorization (EUA). This EUA will remain  in effect (meaning this test can be used) for the duration of the COVID-19 declaration under Section 564(b)(1) of the Act, 21 U.S.C.section 360bbb-3(b)(1), unless the authorization is terminated  or revoked sooner.       Influenza A by PCR NEGATIVE NEGATIVE Final   Influenza B by PCR NEGATIVE NEGATIVE Final    Comment: (NOTE) The Xpert Xpress SARS-CoV-2/FLU/RSV plus assay is intended as an aid in the diagnosis of influenza from Nasopharyngeal swab specimens and should not be used as a sole basis for treatment. Nasal washings and aspirates are unacceptable for Xpert Xpress SARS-CoV-2/FLU/RSV testing.  Fact Sheet for Patients: EntrepreneurPulse.com.au  Fact Sheet for Healthcare Providers: IncredibleEmployment.be  This test is not yet approved or cleared by the [place] FDA and has been authorized for detection and/or diagnosis of SARS-CoV-2 by FDA  under an Emergency Use Authorization (EUA). This EUA will remain in effect (meaning this test can be used) for the duration of the COVID-19 declaration under Section 564(b)(1) of the Act, 21 U.S.C. section 360bbb-3(b)(1), unless the authorization is terminated or revoked.  Performed at [facility], [address] [place], [place] [address]          Radiology Studies: No results found.      Scheduled Meds:  apixaban  10 mg Oral BID   Followed by   Derrill Memo ON 01/28/2021] apixaban  5 mg Oral BID   atorvastatin  80 mg Oral Daily   dextromethorphan-guaiFENesin  1 tablet Oral BID   furosemide  60 mg Intravenous Q8H   losartan  12.5 mg Oral Daily   metoprolol succinate  12.5 mg Oral Daily   potassium chloride  40 mEq Oral Daily   Continuous Infusions:     LOS: 5 days    Time spent: 35 minutes    Barb Merino, MD [facility] Pager [phone]

## 2021-01-22 NOTE — Progress Notes (Signed)
Progress Note  Patient Name: Joshua Cordova Date of Encounter: 01/22/2021  Primary Cardiologist: Rockey Situ  Subjective   SOB improving. No chest pain. Some abdominal bloating overnight. Did not sleep well. Vigorous UOP. Documented UOP 1.2 L for the admission with noted improvement in SCr over the past 48 hours.   Inpatient Medications    Scheduled Meds:  apixaban  10 mg Oral BID   Followed by   Derrill Memo ON 01/28/2021] apixaban  5 mg Oral BID   atorvastatin  80 mg Oral Daily   dextromethorphan-guaiFENesin  1 tablet Oral BID   furosemide  60 mg Intravenous Q8H   losartan  12.5 mg Oral Daily   metoprolol succinate  12.5 mg Oral Daily   potassium chloride  40 mEq Oral Daily   Continuous Infusions:  PRN Meds: acetaminophen, albuterol, benzonatate, dextromethorphan-guaiFENesin, hydrALAZINE, ondansetron (ZOFRAN) IV, traZODone   Vital Signs    Vitals:   01/21/21 2338 01/22/21 0646 01/22/21 0700 01/22/21 1201  BP: 118/83 108/87  117/84  Pulse: 97 90  87  Resp: 16   16  Temp: 97.8 F (36.6 C) 97.7 F (36.5 C)  98.6 F (37 C)  TempSrc: Oral Oral  Oral  SpO2: 94% 97%  95%  Weight:   95.9 kg   Height:        Intake/Output Summary (Last 24 hours) at 01/22/2021 1258 Last data filed at 01/22/2021 0950 Gross per 24 hour  Intake 1555 ml  Output 1400 ml  Net 155 ml   Filed Weights   01/17/21 0033 01/19/21 0902 01/22/21 0700  Weight: 95.7 kg 95.2 kg 95.9 kg    Telemetry    SR with PVCs, 6 beat run of NSVT - Personally Reviewed  ECG    No new tracings - Personally Reviewed  Physical Exam   GEN: No acute distress.   Neck: JVD elevated to the angle of the mandible. Cardiac: RRR, no murmurs, rubs, or gallops.  Respiratory: Faint crackles along the bases bilaterally.  GI: Soft, nontender, non-distended.   MS: 2+ bilateral lower extremity edema; No deformity. Neuro:  Alert and oriented x 3; Nonfocal.  Psych: Normal affect.  Labs    Chemistry Recent Labs  Lab  01/17/21 0253 01/20/21 0626 01/21/21 0832 01/22/21 0807  NA  --  136 135 135  K  --  3.5 3.7 4.0  CL  --  104 103 102  CO2  --  22 21* 24  GLUCOSE  --  123* 129* 141*  BUN  --  40* 39* 40*  CREATININE  --  1.83* 1.62* 1.56*  CALCIUM  --  8.3* 8.7* 8.9  PROT 7.8  --   --   --   ALBUMIN 3.6  --   --   --   AST 27  --   --   --   ALT 20  --   --   --   ALKPHOS 89  --   --   --   BILITOT 1.4*  --   --   --   GFRNONAA  --  45* 52* 54*  ANIONGAP  --  10 11 9      Hematology Recent Labs  Lab 01/20/21 0626 01/21/21 0832 01/22/21 0807  WBC 7.5 8.0 8.9  RBC 4.00* 4.32 4.42  HGB 12.1* 12.8* 13.5  HCT 36.2* 39.5 40.7  MCV 90.5 91.4 92.1  MCH 30.3 29.6 30.5  MCHC 33.4 32.4 33.2  RDW 14.6 14.7 14.9  PLT 215 232  240    Cardiac EnzymesNo results for input(s): TROPONINI in the last 168 hours. No results for input(s): TROPIPOC in the last 168 hours.   BNP Recent Labs  Lab 01/17/21 0041  BNP 761.9*     DDimer  Recent Labs  Lab 01/17/21 0259  DDIMER 1.54*     Radiology    No results found.  Cardiac Studies   2D echo 01/19/2021: 1. Left ventricular ejection fraction, by estimation, is 20 to 25%. The  left ventricle has severely decreased function. The left ventricle  demonstrates global hypokinesis. The left ventricular internal cavity size  was severely dilated. Left ventricular  diastolic parameters are consistent with Grade II diastolic dysfunction  (pseudonormalization). Elevated left atrial pressure.   2. Right ventricular systolic function is moderately reduced. The right  ventricular size is moderately enlarged. There is severely elevated  pulmonary artery systolic pressure.   3. Left atrial size was severely dilated.   4. Right atrial size was severely dilated.   5. The mitral valve is abnormal. Moderate to severe mitral valve  regurgitation.   6. Tricuspid valve regurgitation is moderate.   7. The aortic valve is tricuspid. Aortic valve regurgitation is  mild. No  aortic stenosis is present.   8. The inferior vena cava is dilated in size with <50% respiratory  variability, suggesting right atrial pressure of 15 mmHg.  Patient Profile     51 y.o. 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 Sloan Leiter.  Assessment & Plan    1. Biventricular failure with acute on chronic combined systolic and diastolic CHF and pulmonary hypertension with history of dilated NICM: -He remains significantly volume overloaded, though volume status is improving -Continue titrated dose of IV Lasix 40 mg tid for today,  -Consider addition of metolazone and/or further titration of IV Lasix, tomorrow pending UOP and renal function -Continue low dose losartan and Toprol XL -Escalate GDMT as able, underlying CKD precludes addition of MRA or digoxin at this time -Will need close monitoring of renal function -Daily weights -Strict I/O -May need medication management at discharge    2. Nonobstructive CAD with elevated high sensitivity troponin: -Mildly elevated peaking at 47 -Not consistent with ACS -Likely supply demand ischemia in the setting of volume overload, PE, and CKD -LDL 103 -Titrate Lipitor to 80 mg daily -No plans for inpatient ischemic evaluation  -Follow up as outpatient    3. PE: -Eliquis -Outpatient follow up with PCP   4. Acute on CKD stage III: -Improving today with diuresis -Monitor     For questions or updates, please contact [place] Please consult [email or web address] for contact info under Cardiology/STEMI.    Signed, Christell Faith, PA-C [place] Pager: [phone] 01/22/2021, 12:58 PM

## 2021-01-23 DIAGNOSIS — N183 Chronic kidney disease, stage 3 unspecified: Secondary | ICD-10-CM

## 2021-01-23 DIAGNOSIS — I5023 Acute on chronic systolic (congestive) heart failure: Secondary | ICD-10-CM

## 2021-01-23 LAB — BASIC METABOLIC PANEL
Anion gap: 8 (ref 5–15)
BUN: 43 mg/dL — ABNORMAL HIGH (ref 6–20)
CO2: 23 mmol/L (ref 22–32)
Calcium: 9.2 mg/dL (ref 8.9–10.3)
Chloride: 105 mmol/L (ref 98–111)
Creatinine, Ser: 1.43 mg/dL — ABNORMAL HIGH (ref 0.61–1.24)
GFR, Estimated: >60 mL/min (ref >60)
Glucose, Bld: 140 mg/dL — ABNORMAL HIGH (ref 70–99)
Potassium: 3.9 mmol/L (ref 3.5–5.1)
Sodium: 136 mmol/L (ref 135–145)

## 2021-01-23 NOTE — Plan of Care (Signed)

## 2021-01-23 NOTE — Progress Notes (Signed)
PROGRESS NOTE    Joshua Cordova  UQJ:[id number] DOB: 1970-12-16 DOA: 01/17/2021 PCP: Karie Schwalbe, MD    Brief Narrative:  51 year old gentleman with history of chronic congestive heart failure with ejection fraction less than 20%, hypertension, hyperlipidemia, diet-controlled diabetes, presented to the ER with shortness of breath ongoing for about a month.  In the emergency room, he was initially on 2 L oxygen.  CT angiogram showed left upper lobe and middle lobe segmental and subsegmental PE.  Patient was also noted to be grossly fluid overloaded.   Assessment & Plan:   Principal Problem:   Acute pulmonary embolism (HCC) Active Problems:   Essential hypertension   Asthma, allergic, mild intermittent, uncomplicated   Type II diabetes mellitus with renal manifestations (HCC)   Acute on chronic systolic congestive heart failure (HCC)   Elevated troponin   HLD (hyperlipidemia)   CKD (chronic kidney disease), stage IIIa   Mediastinal lymphadenopathy   Hematochezia   Hypotension  Acute pulmonary embolism: Treated with heparin drip.  Converting to Eliquis.   Lower extremity duplexes were negative for DVT.  Acute on chronic systolic congestive heart failure: Acute hypoxemic respiratory failure.  Presented with fluid overload and hypoxemia.  Echocardiogram with ejection fraction less than 20%.  Recent cardiac cath with no significant coronary artery disease.  Followed by cardiology as outpatient.   Continues to be volume overloaded. Currently on Lasix 60 mg 3 times a day, losartan 12.5 mg, metoprolol 12.5 mg.  Potassium is adequate.  Followed by cardiology. Strict intake output monitoring. Hold Lasix only for systolic blood pressure less than 90.  AKI on CKD stage IIIa: Improving with diuresis.  Monitor daily.  Type 2 diabetes with CKD stage IIIa: Known A1c 6.8.  Diet controlled.  Not on treatment.  He will continue low-carb diet.  Essential hypertension: Stable.  Tolerating heart  failure therapies today.   DVT prophylaxis: Place and maintain sequential compression device Start: 01/18/21 1113 Place TED hose Start: 01/18/21 1112 apixaban (ELIQUIS) tablet 10 mg  apixaban (ELIQUIS) tablet 5 mg   Code Status: Full code Family Communication: None today. Disposition Plan: Status is: Inpatient  Remains inpatient appropriate because: IV diuresis.  Significantly volume overloaded today.   Consultants:  Cardiology  Procedures:  None  Antimicrobials:  None   Subjective:  Patient seen and examined.  Right now feels better after getting morning dose of Lasix.  Nighttime dose of Lasix was a prescription and he felt bloated and short of breath.  Objective: Vitals:   01/22/21 2323 01/23/21 0338 01/23/21 0614 01/23/21 0833  BP: 109/81 105/83 103/71 114/87  Pulse: 87 83 87 93  Resp: 17 18  14   Temp: 98.7 F (37.1 C) 98 F (36.7 C)  97.6 F (36.4 C)  TempSrc: Oral Oral  Oral  SpO2: 94% 99% 93% 96%  Weight:  96.2 kg    Height:        Intake/Output Summary (Last 24 hours) at 01/23/2021 1158 Last data filed at 01/23/2021 1018 Gross per 24 hour  Intake 480 ml  Output 1100 ml  Net -620 ml   Filed Weights   01/19/21 0902 01/22/21 0700 01/23/21 0338  Weight: 95.2 kg 95.9 kg 96.2 kg    Examination:  General: Anxious.  Frustrated.  On room air while sleeping. Short of breath on minimal mobility. Cardiovascular: S1-S2 normal.  Regular rate rhythm. Respiratory: Poor air entry at bases.  No added sounds.   Gastrointestinal: Soft and nontender.  Bowel sound present. Ext: 2+  edema mostly on the dorsum of the feet.     Data Reviewed: I have personally reviewed following labs and imaging studies  CBC: Recent Labs  Lab 01/19/21 0025 01/19/21 0533 01/20/21 0626 01/21/21 0832 01/22/21 0807  WBC 7.9 8.0 7.5 8.0 8.9  NEUTROABS  --   --  4.9  --   --   HGB 13.2 12.3* 12.1* 12.8* 13.5  HCT 40.0 36.6* 36.2* 39.5 40.7  MCV 90.3 90.8 90.5 91.4 92.1  PLT 221  192 215 232 [id number]   Basic Metabolic Panel: Recent Labs  Lab 01/17/21 0041 01/20/21 0626 01/21/21 0832 01/22/21 0807 01/23/21 0813  NA 132* 136 135 135 136  K 3.5 3.5 3.7 4.0 3.9  CL 103 104 103 102 105  CO2 20* 22 21* 24 23  GLUCOSE 150* 123* 129* 141* 140*  BUN 34* 40* 39* 40* 43*  CREATININE 1.57* 1.83* 1.62* 1.56* 1.43*  CALCIUM 8.6* 8.3* 8.7* 8.9 9.2  MG  --  2.2  --   --   --   PHOS  --  4.3  --   --   --    GFR: Estimated Creatinine Clearance: 74 mL/min (A) (by C-G formula based on SCr of 1.43 mg/dL (H)). Liver Function Tests: Recent Labs  Lab 01/17/21 0253  AST 27  ALT 20  ALKPHOS 89  BILITOT 1.4*  PROT 7.8  ALBUMIN 3.6   Recent Labs  Lab 01/17/21 0253  LIPASE 45   No results for input(s): AMMONIA in the last 168 hours. Coagulation Profile: Recent Labs  Lab 01/17/21 0253  INR 1.1   Cardiac Enzymes: No results for input(s): CKTOTAL, CKMB, CKMBINDEX, TROPONINI in the last 168 hours. BNP (last 3 results) No results for input(s): PROBNP in the last 8760 hours. HbA1C: No results for input(s): HGBA1C in the last 72 hours.  CBG: No results for input(s): GLUCAP in the last 168 hours. Lipid Profile: No results for input(s): CHOL, HDL, LDLCALC, TRIG, CHOLHDL, LDLDIRECT in the last 72 hours.  Thyroid Function Tests: No results for input(s): TSH, T4TOTAL, FREET4, T3FREE, THYROIDAB in the last 72 hours. Anemia Panel: No results for input(s): VITAMINB12, FOLATE, FERRITIN, TIBC, IRON, RETICCTPCT in the last 72 hours. Sepsis Labs: Recent Labs  Lab 01/17/21 0041  PROCALCITON <0.10    Recent Results (from the past 240 hour(s))  Resp Panel by RT-PCR (Flu A&B, Covid) Nasopharyngeal Swab     Status: None   Collection Time: 01/17/21  2:53 AM   Specimen: Nasopharyngeal Swab; Nasopharyngeal(NP) swabs in vial transport medium  Result Value Ref Range Status   SARS Coronavirus 2 by RT PCR NEGATIVE NEGATIVE Final    Comment: (NOTE) SARS-CoV-2 target nucleic acids  are NOT DETECTED.  The SARS-CoV-2 RNA is generally detectable in upper respiratory specimens during the acute phase of infection. The lowest concentration of SARS-CoV-2 viral copies this assay can detect is 138 copies/mL. A negative result does not preclude SARS-Cov-2 infection and should not be used as the sole basis for treatment or other patient management decisions. A negative result may occur with  improper specimen collection/handling, submission of specimen other than nasopharyngeal swab, presence of viral mutation(s) within the areas targeted by this assay, and inadequate number of viral copies(<138 copies/mL). A negative result must be combined with clinical observations, patient history, and epidemiological information. The expected result is Negative.  Fact Sheet for Patients:  EntrepreneurPulse.com.au  Fact Sheet for Healthcare Providers:  IncredibleEmployment.be  This test is no t yet approved or  cleared by the [place] and  has been authorized for detection and/or diagnosis of SARS-CoV-2 by FDA under an Emergency Use Authorization (EUA). This EUA will remain  in effect (meaning this test can be used) for the duration of the COVID-19 declaration under Section 564(b)(1) of the Act, 21 U.S.C.section 360bbb-3(b)(1), unless the authorization is terminated  or revoked sooner.       Influenza A by PCR NEGATIVE NEGATIVE Final   Influenza B by PCR NEGATIVE NEGATIVE Final    Comment: (NOTE) The Xpert Xpress SARS-CoV-2/FLU/RSV plus assay is intended as an aid in the diagnosis of influenza from Nasopharyngeal swab specimens and should not be used as a sole basis for treatment. Nasal washings and aspirates are unacceptable for Xpert Xpress SARS-CoV-2/FLU/RSV testing.  Fact Sheet for Patients: EntrepreneurPulse.com.au  Fact Sheet for Healthcare Providers: IncredibleEmployment.be  This test is  not yet approved or cleared by the [place] FDA and has been authorized for detection and/or diagnosis of SARS-CoV-2 by FDA under an Emergency Use Authorization (EUA). This EUA will remain in effect (meaning this test can be used) for the duration of the COVID-19 declaration under Section 564(b)(1) of the Act, 21 U.S.C. section 360bbb-3(b)(1), unless the authorization is terminated or revoked.  Performed at [facility], [address], [place], [place] [address]          Radiology Studies: No results found.      Scheduled Meds:  apixaban  10 mg Oral BID   Followed by   Derrill Memo ON 01/28/2021] apixaban  5 mg Oral BID   atorvastatin  80 mg Oral Daily   dapagliflozin propanediol  10 mg Oral Daily   dextromethorphan-guaiFENesin  1 tablet Oral BID   furosemide  60 mg Intravenous Q8H   losartan  12.5 mg Oral Daily   methocarbamol  500 mg Oral Once   metoprolol succinate  12.5 mg Oral Daily   potassium chloride  40 mEq Oral Daily   Continuous Infusions:     LOS: 6 days    Time spent: 35 minutes    Barb Merino, MD [facility] Pager [phone]

## 2021-01-23 NOTE — Progress Notes (Addendum)
Progress Note  Patient Name: Joshua Cordova Date of Encounter: 01/23/2021  Primary Cardiologist: Rockey Situ  Subjective   Net out 1.8 L since admission.  Continues to have shortness of breath.  Frustrated that he did not get Lasix last night.   Inpatient Medications    Scheduled Meds:  apixaban  10 mg Oral BID   Followed by   Derrill Memo ON 01/28/2021] apixaban  5 mg Oral BID   atorvastatin  80 mg Oral Daily   dapagliflozin propanediol  10 mg Oral Daily   dextromethorphan-guaiFENesin  1 tablet Oral BID   furosemide  60 mg Intravenous Q8H   losartan  12.5 mg Oral Daily   methocarbamol  500 mg Oral Once   metoprolol succinate  12.5 mg Oral Daily   potassium chloride  40 mEq Oral Daily   Continuous Infusions:  PRN Meds: acetaminophen, albuterol, benzonatate, dextromethorphan-guaiFENesin, hydrALAZINE, ondansetron (ZOFRAN) IV, traZODone   Vital Signs    Vitals:   01/22/21 2323 01/23/21 0338 01/23/21 0614 01/23/21 0833  BP: 109/81 105/83 103/71 114/87  Pulse: 87 83 87 93  Resp: 17 18  14   Temp: 98.7 F (37.1 C) 98 F (36.7 C)  97.6 F (36.4 C)  TempSrc: Oral Oral  Oral  SpO2: 94% 99% 93% 96%  Weight:  96.2 kg    Height:        Intake/Output Summary (Last 24 hours) at 01/23/2021 1107 Last data filed at 01/23/2021 1018 Gross per 24 hour  Intake 480 ml  Output 1100 ml  Net -620 ml    Filed Weights   01/19/21 0902 01/22/21 0700 01/23/21 0338  Weight: 95.2 kg 95.9 kg 96.2 kg    Telemetry    Sinus rhythm-personally reviewed  ECG    None new  Physical Exam   GEN: Well nourished, well developed, in no acute distress  HEENT: normal  Neck: +JVD, carotid bruits, or masses Cardiac: RRR; no murmurs, rubs, or gallops,no edema  Respiratory:  clear to auscultation bilaterally, normal work of breathing GI: soft, nontender, nondistended, + BS MS: no deformity or atrophy  Skin: warm and dry Neuro:  Strength and sensation are intact Psych: euthymic mood, full affect   Labs     Chemistry Recent Labs  Lab 01/17/21 0253 01/20/21 0626 01/21/21 0832 01/22/21 0807 01/23/21 0813  NA  --    < > 135 135 136  K  --    < > 3.7 4.0 3.9  CL  --    < > 103 102 105  CO2  --    < > 21* 24 23  GLUCOSE  --    < > 129* 141* 140*  BUN  --    < > 39* 40* 43*  CREATININE  --    < > 1.62* 1.56* 1.43*  CALCIUM  --    < > 8.7* 8.9 9.2  PROT 7.8  --   --   --   --   ALBUMIN 3.6  --   --   --   --   AST 27  --   --   --   --   ALT 20  --   --   --   --   ALKPHOS 89  --   --   --   --   BILITOT 1.4*  --   --   --   --   GFRNONAA  --    < > 52* 54* >60  ANIONGAP  --    < >  11 9 8    < > = values in this interval not displayed.      Hematology Recent Labs  Lab 01/20/21 0626 01/21/21 0832 01/22/21 0807  WBC 7.5 8.0 8.9  RBC 4.00* 4.32 4.42  HGB 12.1* 12.8* 13.5  HCT 36.2* 39.5 40.7  MCV 90.5 91.4 92.1  MCH 30.3 29.6 30.5  MCHC 33.4 32.4 33.2  RDW 14.6 14.7 14.9  PLT 215 232 240     Cardiac EnzymesNo results for input(s): TROPONINI in the last 168 hours. No results for input(s): TROPIPOC in the last 168 hours.   BNP Recent Labs  Lab 01/17/21 0041  BNP 761.9*      DDimer  Recent Labs  Lab 01/17/21 0259  DDIMER 1.54*      Radiology    No results found.  Cardiac Studies   2D echo 01/19/2021: 1. Left ventricular ejection fraction, by estimation, is 20 to 25%. The  left ventricle has severely decreased function. The left ventricle  demonstrates global hypokinesis. The left ventricular internal cavity size  was severely dilated. Left ventricular  diastolic parameters are consistent with Grade II diastolic dysfunction  (pseudonormalization). Elevated left atrial pressure.   2. Right ventricular systolic function is moderately reduced. The right  ventricular size is moderately enlarged. There is severely elevated  pulmonary artery systolic pressure.   3. Left atrial size was severely dilated.   4. Right atrial size was severely dilated.   5. The  mitral valve is abnormal. Moderate to severe mitral valve  regurgitation.   6. Tricuspid valve regurgitation is moderate.   7. The aortic valve is tricuspid. Aortic valve regurgitation is mild. No  aortic stenosis is present.   8. The inferior vena cava is dilated in size with <50% respiratory  variability, suggesting right atrial pressure of 15 mmHg.  Patient Profile     51 y.o. 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 Sloan Leiter.  Assessment & Plan    1.  Acute on chronic systolic heart failure ventricular heart failure with pulmonary hypertension: Due to dilated cardiomyopathy.  Currently on Lasix 60 mg every 8 hours.  Net out 1.8 L since admission.  He is feeling better, but does continue to have evidence of volume overload.  Would continue with diuresis.  Creatinine has continued to improve.  2.  Nonobstructive coronary artery disease: High-sensitivity troponin elevated at 47.  Not consistent with ACS.  Continue Lipitor 80 mg daily.  No changes.  3.  Pulmonary embolism: Continue Eliquis.  Would plan for outpatient follow-up with PCP.  4.  Acute on chronic CKD stage III: Creatinine continuing to improve with diuresis.   For questions or updates, please contact [place] Please consult [email or web address] for contact info under Cardiology/STEMI.    Signed, Allegra Lai, MD 01/23/2021, 11:07 AM

## 2021-01-24 LAB — BASIC METABOLIC PANEL
Anion gap: 11 (ref 5–15)
BUN: 47 mg/dL — ABNORMAL HIGH (ref 6–20)
CO2: 25 mmol/L (ref 22–32)
Calcium: 9.6 mg/dL (ref 8.9–10.3)
Chloride: 100 mmol/L (ref 98–111)
Creatinine, Ser: 1.51 mg/dL — ABNORMAL HIGH (ref 0.61–1.24)
GFR, Estimated: 56 mL/min — ABNORMAL LOW (ref >60)
Glucose, Bld: 110 mg/dL — ABNORMAL HIGH (ref 70–99)
Potassium: 3.9 mmol/L (ref 3.5–5.1)
Sodium: 136 mmol/L (ref 135–145)

## 2021-01-24 NOTE — Progress Notes (Signed)
PROGRESS NOTE    Joshua Cordova  AYT:[id number] DOB: [birth year] DOA: 01/17/2021 PCP: Karie Schwalbe, MD    Brief Narrative:  51 year old gentleman with history of chronic congestive heart failure with ejection fraction less than 20%, hypertension, hyperlipidemia, diet-controlled diabetes, presented to the ER with shortness of breath ongoing for about a month.  In the emergency room, he was initially on 2 L oxygen.  CT angiogram showed left upper lobe and middle lobe segmental and subsegmental PE.  Patient was also noted to be grossly fluid overloaded.   Assessment & Plan:   Principal Problem:   Acute pulmonary embolism (HCC) Active Problems:   Essential hypertension   Asthma, allergic, mild intermittent, uncomplicated   Type II diabetes mellitus with renal manifestations (HCC)   Acute on chronic systolic congestive heart failure (HCC)   Elevated troponin   HLD (hyperlipidemia)   CKD (chronic kidney disease), stage IIIa   Mediastinal lymphadenopathy   Hematochezia   Hypotension  Acute pulmonary embolism: Treated with heparin drip.  Converting to Eliquis. Lower extremity duplexes were negative for DVT.  Acute on chronic systolic congestive heart failure: Acute hypoxemic respiratory failure.  Presented with fluid overload and hypoxemia.  Echocardiogram with ejection fraction less than 20%.  Recent cardiac cath with no significant coronary artery disease.  Followed by cardiology as outpatient.   Continues to be volume overloaded. Currently on Lasix 60 mg 3 times a day, losartan 12.5 mg, metoprolol 12.5 mg.  Potassium is adequate.  Followed by cardiology. Strict intake output monitoring.  Still grossly volume overloaded.  AKI on CKD stage IIIa: Improving with diuresis.  Monitor daily.  Creatinine 1.5.  At about baseline.  Type 2 diabetes with CKD stage IIIa: Known A1c 6.8.  Diet controlled.  Not on treatment.  He will continue low-carb diet.  Essential hypertension: Stable.   Tolerating heart failure therapies today.   DVT prophylaxis: Place and maintain sequential compression device Start: 01/18/21 1113 Place TED hose Start: 01/18/21 1112 apixaban (ELIQUIS) tablet 10 mg  apixaban (ELIQUIS) tablet 5 mg   Code Status: Full code Family Communication: Mother at the bedside. Disposition Plan: Status is: Inpatient  Remains inpatient appropriate because: IV diuresis.  Significantly volume overloaded today.   Consultants:  Cardiology  Procedures:  None  Antimicrobials:  None   Subjective:  Patient seen and examined.  Urinated more than 5 L last 24 hours.  Breathing feels better but he feels tired and drained.  Has some cramping in his legs.  Objective: Vitals:   01/23/21 2318 01/24/21 0358 01/24/21 0800 01/24/21 1128  BP: (!) 118/93 110/85 107/83 109/81  Pulse: 98 87 92 89  Resp: 18 18 14 14   Temp: 98.4 F (36.9 C) 98 F (36.7 C) 98 F (36.7 C) (!) 97.2 F (36.2 C)  TempSrc: Oral Oral Oral Oral  SpO2: 94% 94% 100% 93%  Weight:  94.5 kg    Height:        Intake/Output Summary (Last 24 hours) at 01/24/2021 1144 Last data filed at 01/24/2021 1136 Gross per 24 hour  Intake 840 ml  Output 5125 ml  Net -4285 ml   Filed Weights   01/22/21 0700 01/23/21 0338 01/24/21 0358  Weight: 95.9 kg 96.2 kg 94.5 kg    Examination:  General: Anxious with flat affect.  Lying in bed.  Not in any distress.  On room air. Cardiovascular: S1-S2 normal.  Regular rate rhythm. Respiratory: Poor air entry at bases.  No added sounds.   Gastrointestinal: Soft  and nontender.  Bowel sound present. Grossly edematous foot and bilateral leg.     Data Reviewed: I have personally reviewed following labs and imaging studies  CBC: Recent Labs  Lab 01/19/21 0025 01/19/21 0533 01/20/21 0626 01/21/21 0832 01/22/21 0807  WBC 7.9 8.0 7.5 8.0 8.9  NEUTROABS  --   --  4.9  --   --   HGB 13.2 12.3* 12.1* 12.8* 13.5  HCT 40.0 36.6* 36.2* 39.5 40.7  MCV 90.3 90.8  90.5 91.4 92.1  PLT 221 192 215 232 [id number]   Basic Metabolic Panel: Recent Labs  Lab 01/20/21 0626 01/21/21 0832 01/22/21 0807 01/23/21 0813 01/24/21 0413  NA 136 135 135 136 136  K 3.5 3.7 4.0 3.9 3.9  CL 104 103 102 105 100  CO2 22 21* 24 23 25   GLUCOSE 123* 129* 141* 140* 110*  BUN 40* 39* 40* 43* 47*  CREATININE 1.83* 1.62* 1.56* 1.43* 1.51*  CALCIUM 8.3* 8.7* 8.9 9.2 9.6  MG 2.2  --   --   --   --   PHOS 4.3  --   --   --   --    GFR: Estimated Creatinine Clearance: 69.5 mL/min (A) (by C-G formula based on SCr of 1.51 mg/dL (H)). Liver Function Tests: No results for input(s): AST, ALT, ALKPHOS, BILITOT, PROT, ALBUMIN in the last 168 hours.  No results for input(s): LIPASE, AMYLASE in the last 168 hours.  No results for input(s): AMMONIA in the last 168 hours. Coagulation Profile: No results for input(s): INR, PROTIME in the last 168 hours.  Cardiac Enzymes: No results for input(s): CKTOTAL, CKMB, CKMBINDEX, TROPONINI in the last 168 hours. BNP (last 3 results) No results for input(s): PROBNP in the last 8760 hours. HbA1C: No results for input(s): HGBA1C in the last 72 hours.  CBG: No results for input(s): GLUCAP in the last 168 hours. Lipid Profile: No results for input(s): CHOL, HDL, LDLCALC, TRIG, CHOLHDL, LDLDIRECT in the last 72 hours.  Thyroid Function Tests: No results for input(s): TSH, T4TOTAL, FREET4, T3FREE, THYROIDAB in the last 72 hours. Anemia Panel: No results for input(s): VITAMINB12, FOLATE, FERRITIN, TIBC, IRON, RETICCTPCT in the last 72 hours. Sepsis Labs: No results for input(s): PROCALCITON, LATICACIDVEN in the last 168 hours.   Recent Results (from the past 240 hour(s))  Resp Panel by RT-PCR (Flu A&B, Covid) Nasopharyngeal Swab     Status: None   Collection Time: 01/17/21  2:53 AM   Specimen: Nasopharyngeal Swab; Nasopharyngeal(NP) swabs in vial transport medium  Result Value Ref Range Status   SARS Coronavirus 2 by RT PCR NEGATIVE  NEGATIVE Final    Comment: (NOTE) SARS-CoV-2 target nucleic acids are NOT DETECTED.  The SARS-CoV-2 RNA is generally detectable in upper respiratory specimens during the acute phase of infection. The lowest concentration of SARS-CoV-2 viral copies this assay can detect is 138 copies/mL. A negative result does not preclude SARS-Cov-2 infection and should not be used as the sole basis for treatment or other patient management decisions. A negative result may occur with  improper specimen collection/handling, submission of specimen other than nasopharyngeal swab, presence of viral mutation(s) within the areas targeted by this assay, and inadequate number of viral copies(<138 copies/mL). A negative result must be combined with clinical observations, patient history, and epidemiological information. The expected result is Negative.  Fact Sheet for Patients:  EntrepreneurPulse.com.au  Fact Sheet for Healthcare Providers:  IncredibleEmployment.be  This test is no t yet approved or cleared by the  [place] Architectural [employer] and  has been authorized for detection and/or diagnosis of SARS-CoV-2 by FDA under an Print production planner (EUA). This EUA will remain  in effect (meaning this test can be used) for the duration of the COVID-19 declaration under Section 564(b)(1) of the Act, 21 U.S.C.section 360bbb-3(b)(1), unless the authorization is terminated  or revoked sooner.       Influenza A by PCR NEGATIVE NEGATIVE Final   Influenza B by PCR NEGATIVE NEGATIVE Final    Comment: (NOTE) The Xpert Xpress SARS-CoV-2/FLU/RSV plus assay is intended as an aid in the diagnosis of influenza from Nasopharyngeal swab specimens and should not be used as a sole basis for treatment. Nasal washings and aspirates are unacceptable for Xpert Xpress SARS-CoV-2/FLU/RSV testing.  Fact Sheet for Patients: EntrepreneurPulse.com.au  Fact Sheet for Healthcare  Providers: IncredibleEmployment.be  This test is not yet approved or cleared by the [place] FDA and has been authorized for detection and/or diagnosis of SARS-CoV-2 by FDA under an Emergency Use Authorization (EUA). This EUA will remain in effect (meaning this test can be used) for the duration of the COVID-19 declaration under Section 564(b)(1) of the Act, 21 U.S.C. section 360bbb-3(b)(1), unless the authorization is terminated or revoked.  Performed at [facility], [address] [place], [place] [address]          Radiology Studies: No results found.      Scheduled Meds:  apixaban  10 mg Oral BID   Followed by   Derrill Memo ON 01/28/2021] apixaban  5 mg Oral BID   atorvastatin  80 mg Oral Daily   dapagliflozin propanediol  10 mg Oral Daily   dextromethorphan-guaiFENesin  1 tablet Oral BID   furosemide  60 mg Intravenous Q8H   losartan  12.5 mg Oral Daily   methocarbamol  500 mg Oral Once   metoprolol succinate  12.5 mg Oral Daily   potassium chloride  40 mEq Oral Daily   Continuous Infusions:     LOS: 7 days    Time spent: 35 minutes    Barb Merino, MD [facility] Pager [phone]

## 2021-01-24 NOTE — Progress Notes (Addendum)
Progress Note  Patient Name: Joshua Cordova Date of Encounter: 01/24/2021  Primary Cardiologist: Rockey Situ  Subjective   Net -4.9 L.  Breathing better today than he was yesterday.  Overall happy with his progress.  Inpatient Medications    Scheduled Meds:  apixaban  10 mg Oral BID   Followed by   Derrill Memo ON 01/28/2021] apixaban  5 mg Oral BID   atorvastatin  80 mg Oral Daily   dapagliflozin propanediol  10 mg Oral Daily   dextromethorphan-guaiFENesin  1 tablet Oral BID   furosemide  60 mg Intravenous Q8H   losartan  12.5 mg Oral Daily   methocarbamol  500 mg Oral Once   metoprolol succinate  12.5 mg Oral Daily   potassium chloride  40 mEq Oral Daily   Continuous Infusions:  PRN Meds: acetaminophen, albuterol, benzonatate, dextromethorphan-guaiFENesin, hydrALAZINE, ondansetron (ZOFRAN) IV, traZODone   Vital Signs    Vitals:   01/23/21 1918 01/23/21 2318 01/24/21 0358 01/24/21 0800  BP: 106/76 (!) 118/93 110/85 107/83  Pulse: 92 98 87 92  Resp: 18 18 18 14   Temp: 98.4 F (36.9 C) 98.4 F (36.9 C) 98 F (36.7 C) 98 F (36.7 C)  TempSrc: Oral Oral Oral Oral  SpO2: 97% 94% 94% 100%  Weight:   94.5 kg   Height:        Intake/Output Summary (Last 24 hours) at 01/24/2021 1118 Last data filed at 01/24/2021 0358 Gross per 24 hour  Intake 840 ml  Output 3925 ml  Net -3085 ml    Filed Weights   01/22/21 0700 01/23/21 0338 01/24/21 0358  Weight: 95.9 kg 96.2 kg 94.5 kg    Telemetry    Sinus rhythm-personally reviewed  ECG    None new  Physical Exam    GEN: Well nourished, well developed, in no acute distress  HEENT: normal  Neck: +JVD, carotid bruits, or masses Cardiac: RRR; no murmurs, rubs, or gallops, 2+ edema  Respiratory:  clear to auscultation bilaterally, normal work of breathing GI: soft, nontender, nondistended, + BS MS: no deformity or atrophy  Skin: warm and dry Neuro:  Strength and sensation are intact Psych: euthymic mood, full affect   Labs     Chemistry Recent Labs  Lab 01/22/21 0807 01/23/21 0813 01/24/21 0413  NA 135 136 136  K 4.0 3.9 3.9  CL 102 105 100  CO2 24 23 25   GLUCOSE 141* 140* 110*  BUN 40* 43* 47*  CREATININE 1.56* 1.43* 1.51*  CALCIUM 8.9 9.2 9.6  GFRNONAA 54* >60 56*  ANIONGAP 9 8 11       Hematology Recent Labs  Lab 01/20/21 0626 01/21/21 0832 01/22/21 0807  WBC 7.5 8.0 8.9  RBC 4.00* 4.32 4.42  HGB 12.1* 12.8* 13.5  HCT 36.2* 39.5 40.7  MCV 90.5 91.4 92.1  MCH 30.3 29.6 30.5  MCHC 33.4 32.4 33.2  RDW 14.6 14.7 14.9  PLT 215 232 240     Cardiac EnzymesNo results for input(s): TROPONINI in the last 168 hours. No results for input(s): TROPIPOC in the last 168 hours.   BNP No results for input(s): BNP, PROBNP in the last 168 hours.    DDimer  No results for input(s): DDIMER in the last 168 hours.    Radiology    No results found.  Cardiac Studies   2D echo 01/19/2021: 1. Left ventricular ejection fraction, by estimation, is 20 to 25%. The  left ventricle has severely decreased function. The left ventricle  demonstrates  global hypokinesis. The left ventricular internal cavity size  was severely dilated. Left ventricular  diastolic parameters are consistent with Grade II diastolic dysfunction  (pseudonormalization). Elevated left atrial pressure.   2. Right ventricular systolic function is moderately reduced. The right  ventricular size is moderately enlarged. There is severely elevated  pulmonary artery systolic pressure.   3. Left atrial size was severely dilated.   4. Right atrial size was severely dilated.   5. The mitral valve is abnormal. Moderate to severe mitral valve  regurgitation.   6. Tricuspid valve regurgitation is moderate.   7. The aortic valve is tricuspid. Aortic valve regurgitation is mild. No  aortic stenosis is present.   8. The inferior vena cava is dilated in size with <50% respiratory  variability, suggesting right atrial pressure of 15  mmHg.  Patient Profile     51 y.o. 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 Sloan Leiter.  Assessment & Plan    1.  Acute on chronic systolic heart failure with pulmonary hypertension: Due to dilated cardiomyopathy.  Currently on Lasix 60 mg every 8 hours.  Net -4.9 L since admission.  He continues to be volume overloaded.  We Crew Goren continue with current diuresis plan.  2.  Nonobstructive coronary artery disease: High-sensitivity troponin elevated at 47.  Not consistent with ACS.  Continue Lipitor 80 mg daily.  3.  Pulmonary embolism: Continue Eliquis  4.  Acute on chronic CKD stage III: Creatinine has remained stable with diuresis.  For questions or updates, please contact [place] Please consult [email or web address] for contact info under Cardiology/STEMI.    Signed, Allegra Lai, MD 01/24/2021, 11:18 AM

## 2021-01-25 ENCOUNTER — Ambulatory Visit: Payer: Self-pay | Admitting: Medical

## 2021-01-25 LAB — BASIC METABOLIC PANEL
Anion gap: 11 (ref 5–15)
BUN: 48 mg/dL — ABNORMAL HIGH (ref 6–20)
CO2: 29 mmol/L (ref 22–32)
Calcium: 9.1 mg/dL (ref 8.9–10.3)
Chloride: 95 mmol/L — ABNORMAL LOW (ref 98–111)
Creatinine, Ser: 1.67 mg/dL — ABNORMAL HIGH (ref 0.61–1.24)
GFR, Estimated: 50 mL/min — ABNORMAL LOW (ref >60)
Glucose, Bld: 192 mg/dL — ABNORMAL HIGH (ref 70–99)
Potassium: 4.2 mmol/L (ref 3.5–5.1)
Sodium: 135 mmol/L (ref 135–145)

## 2021-01-25 LAB — CBC
HCT: 42.6 % (ref 39.0–52.0)
Hemoglobin: 13.9 g/dL (ref 13.0–17.0)
MCH: 29.6 pg (ref 26.0–34.0)
MCHC: 32.6 g/dL (ref 30.0–36.0)
MCV: 90.6 fL (ref 80.0–100.0)
Platelets: 282 10*3/uL (ref 150–400)
RBC: 4.7 MIL/uL (ref 4.22–5.81)
RDW: 14.4 % (ref 11.5–15.5)
WBC: 10.4 10*3/uL (ref 4.0–10.5)
nRBC: 0 % (ref 0.0–0.2)

## 2021-01-25 LAB — PROTHROMBIN GENE MUTATION

## 2021-01-25 MED ORDER — FUROSEMIDE 10 MG/ML IJ SOLN
60.0000 mg | Freq: Two times a day (BID) | INTRAMUSCULAR | Status: DC
  Administered 2021-01-25: 60 mg via INTRAVENOUS
  Filled 2021-01-25: qty 6

## 2021-01-25 MED ORDER — POTASSIUM CHLORIDE CRYS ER 20 MEQ PO TBCR
20.0000 meq | EXTENDED_RELEASE_TABLET | Freq: Every day | ORAL | Status: DC
  Administered 2021-01-26 – 2021-01-27 (×2): 20 meq via ORAL
  Filled 2021-01-25 (×2): qty 1

## 2021-01-25 NOTE — Progress Notes (Signed)
Inpatient Diabetes Program Recommendations  AACE/ADA: New Consensus Statement on Inpatient Glycemic Control (2015)  Target Ranges:  Prepandial:   less than 140 mg/dL      Peak postprandial:   less than 180 mg/dL (1-2 hours)      Critically ill patients:  140 - 180 mg/dL   Lab Results  Component Value Date   GLUCAP 116 (H) 11/02/2019   HGBA1C 6.8 (H) 01/17/2021    Review of Glycemic Control  Diabetes history: DM2 Outpatient Diabetes medications: None Current orders for Inpatient glycemic control: Farxiga 10 mg  Inpatient Diabetes Program Recommendations:   Please consider: Glycemic control order set with Novolog 0-9 units tid + hs 0-5 units correction.  Thank you, Nani Gasser. Starlene Consuegra, RN, MSN, CDE  Diabetes Coordinator Inpatient Glycemic Control Team Team Pager [phone] (8am-5pm) 01/25/2021 11:21 AM

## 2021-01-25 NOTE — Progress Notes (Signed)
PROGRESS NOTE    Joshua Cordova  OVZ:[id number] DOB: 08-30-1970 DOA: 01/17/2021 PCP: Karie Schwalbe, MD    Brief Narrative:  51 year old gentleman with history of chronic congestive heart failure with ejection fraction less than 20%, hypertension, hyperlipidemia, diet-controlled diabetes, presented to the ER with shortness of breath ongoing for about a month.  In the emergency room, he was initially on 2 L oxygen.  CT angiogram showed left upper lobe and middle lobe segmental and subsegmental PE.  Patient was also noted to be grossly fluid overloaded.   Assessment & Plan:   Principal Problem:   Acute pulmonary embolism (HCC) Active Problems:   Essential hypertension   Asthma, allergic, mild intermittent, uncomplicated   Type II diabetes mellitus with renal manifestations (HCC)   Acute on chronic systolic congestive heart failure (HCC)   Elevated troponin   HLD (hyperlipidemia)   CKD (chronic kidney disease), stage IIIa   Mediastinal lymphadenopathy   Hematochezia   Hypotension  Acute pulmonary embolism: Treated with heparin drip.  Now on Eliquis. Lower extremity duplexes were negative for DVT.  Acute on chronic systolic congestive heart failure: Hypoxemia resolved. Presented with fluid overload and hypoxemia.  Echocardiogram with ejection fraction less than 20%.  Recent cardiac cath with no significant coronary artery disease.  Followed by cardiology as outpatient.   Continues to be volume overloaded. Currently on Lasix 60 mg 3 times a day, losartan 12.5 mg, metoprolol 12.5 mg.  Potassium is adequate.  Followed by cardiology.  Continue current regimen. Strict intake output monitoring.   Urine output more than 3 L last 24 hours.  -7 L since admission.  AKI on CKD stage IIIa: Improving with diuresis.  Monitor daily.  Creatinine 1.5.  At about baseline.  Type 2 diabetes with CKD stage IIIa: Known A1c 6.8.  Diet controlled.  Not on treatment.  He will continue low-carb  diet.  Essential hypertension: Stable.  Tolerating heart failure therapies today.   DVT prophylaxis: Place and maintain sequential compression device Start: 01/18/21 1113 Place TED hose Start: 01/18/21 1112 apixaban (ELIQUIS) tablet 10 mg  apixaban (ELIQUIS) tablet 5 mg   Code Status: Full code Family Communication: Mother at the bedside. Disposition Plan: Status is: Inpatient  Remains inpatient appropriate because: IV diuresis.  Significantly volume overloaded.   Consultants:  Cardiology  Procedures:  None  Antimicrobials:  None   Subjective:  Patient seen and examined.  Mother at bedside.  Tired of peeing all night but happy to lose weight and some improvement of leg swelling.  Denies any chest pain or shortness of breath moving around in the room.  Objective: Vitals:   01/24/21 1924 01/24/21 2351 01/25/21 0542 01/25/21 0754  BP: 117/81 106/82 111/82 102/80  Pulse: 91 86 91 87  Resp: 18 14 14 17   Temp: 97.6 F (36.4 C) 97.9 F (36.6 C) 98.8 F (37.1 C) (!) 97.3 F (36.3 C)  TempSrc: Oral Oral Oral   SpO2: 95% 94% 92% 94%  Weight:   92.9 kg   Height:        Intake/Output Summary (Last 24 hours) at 01/25/2021 1107 Last data filed at 01/25/2021 1000 Gross per 24 hour  Intake 1320 ml  Output 3050 ml  Net -1730 ml   Filed Weights   01/23/21 0338 01/24/21 0358 01/25/21 0542  Weight: 96.2 kg 94.5 kg 92.9 kg    Examination:  General: Lying in bed.  Looks comfortable.  Not in distress. Cardiovascular: S1-S2 normal.  Regular rate rhythm. Respiratory: Poor  air entry at bases.  No added sounds.   Gastrointestinal: Soft and nontender.  Bowel sound present. Both legs with 2+ edema. Dorsum of the feet with 3+ edema.     Data Reviewed: I have personally reviewed following labs and imaging studies  CBC: Recent Labs  Lab 01/19/21 0533 01/20/21 0626 01/21/21 0832 01/22/21 0807 01/25/21 0658  WBC 8.0 7.5 8.0 8.9 10.4  NEUTROABS  --  4.9  --   --   --    HGB 12.3* 12.1* 12.8* 13.5 13.9  HCT 36.6* 36.2* 39.5 40.7 42.6  MCV 90.8 90.5 91.4 92.1 90.6  PLT 192 215 232 240 [id number]   Basic Metabolic Panel: Recent Labs  Lab 01/20/21 0626 01/21/21 0832 01/22/21 0807 01/23/21 0813 01/24/21 0413 01/25/21 0658  NA 136 135 135 136 136 135  K 3.5 3.7 4.0 3.9 3.9 4.2  CL 104 103 102 105 100 95*  CO2 22 21* 24 23 25 29   GLUCOSE 123* 129* 141* 140* 110* 192*  BUN 40* 39* 40* 43* 47* 48*  CREATININE 1.83* 1.62* 1.56* 1.43* 1.51* 1.67*  CALCIUM 8.3* 8.7* 8.9 9.2 9.6 9.1  MG 2.2  --   --   --   --   --   PHOS 4.3  --   --   --   --   --    GFR: Estimated Creatinine Clearance: 62.3 mL/min (A) (by C-G formula based on SCr of 1.67 mg/dL (H)). Liver Function Tests: No results for input(s): AST, ALT, ALKPHOS, BILITOT, PROT, ALBUMIN in the last 168 hours.  No results for input(s): LIPASE, AMYLASE in the last 168 hours.  No results for input(s): AMMONIA in the last 168 hours. Coagulation Profile: No results for input(s): INR, PROTIME in the last 168 hours.  Cardiac Enzymes: No results for input(s): CKTOTAL, CKMB, CKMBINDEX, TROPONINI in the last 168 hours. BNP (last 3 results) No results for input(s): PROBNP in the last 8760 hours. HbA1C: No results for input(s): HGBA1C in the last 72 hours.  CBG: No results for input(s): GLUCAP in the last 168 hours. Lipid Profile: No results for input(s): CHOL, HDL, LDLCALC, TRIG, CHOLHDL, LDLDIRECT in the last 72 hours.  Thyroid Function Tests: No results for input(s): TSH, T4TOTAL, FREET4, T3FREE, THYROIDAB in the last 72 hours. Anemia Panel: No results for input(s): VITAMINB12, FOLATE, FERRITIN, TIBC, IRON, RETICCTPCT in the last 72 hours. Sepsis Labs: No results for input(s): PROCALCITON, LATICACIDVEN in the last 168 hours.   Recent Results (from the past 240 hour(s))  Resp Panel by RT-PCR (Flu A&B, Covid) Nasopharyngeal Swab     Status: None   Collection Time: 01/17/21  2:53 AM   Specimen:  Nasopharyngeal Swab; Nasopharyngeal(NP) swabs in vial transport medium  Result Value Ref Range Status   SARS Coronavirus 2 by RT PCR NEGATIVE NEGATIVE Final    Comment: (NOTE) SARS-CoV-2 target nucleic acids are NOT DETECTED.  The SARS-CoV-2 RNA is generally detectable in upper respiratory specimens during the acute phase of infection. The lowest concentration of SARS-CoV-2 viral copies this assay can detect is 138 copies/mL. A negative result does not preclude SARS-Cov-2 infection and should not be used as the sole basis for treatment or other patient management decisions. A negative result may occur with  improper specimen collection/handling, submission of specimen other than nasopharyngeal swab, presence of viral mutation(s) within the areas targeted by this assay, and inadequate number of viral copies(<138 copies/mL). A negative result must be combined with clinical observations, patient history, and  epidemiological information. The expected result is Negative.  Fact Sheet for Patients:  EntrepreneurPulse.com.au  Fact Sheet for Healthcare Providers:  IncredibleEmployment.be  This test is no t yet approved or cleared by the [place] FDA and  has been authorized for detection and/or diagnosis of SARS-CoV-2 by FDA under an Emergency Use Authorization (EUA). This EUA will remain  in effect (meaning this test can be used) for the duration of the COVID-19 declaration under Section 564(b)(1) of the Act, 21 U.S.C.section 360bbb-3(b)(1), unless the authorization is terminated  or revoked sooner.       Influenza A by PCR NEGATIVE NEGATIVE Final   Influenza B by PCR NEGATIVE NEGATIVE Final    Comment: (NOTE) The Xpert Xpress SARS-CoV-2/FLU/RSV plus assay is intended as an aid in the diagnosis of influenza from Nasopharyngeal swab specimens and should not be used as a sole basis for treatment. Nasal washings and aspirates are unacceptable for  Xpert Xpress SARS-CoV-2/FLU/RSV testing.  Fact Sheet for Patients: EntrepreneurPulse.com.au  Fact Sheet for Healthcare Providers: IncredibleEmployment.be  This test is not yet approved or cleared by the [place] FDA and has been authorized for detection and/or diagnosis of SARS-CoV-2 by FDA under an Emergency Use Authorization (EUA). This EUA will remain in effect (meaning this test can be used) for the duration of the COVID-19 declaration under Section 564(b)(1) of the Act, 21 U.S.C. section 360bbb-3(b)(1), unless the authorization is terminated or revoked.  Performed at [facility], [address] [place], [place] [address]          Radiology Studies: No results found.      Scheduled Meds:  apixaban  10 mg Oral BID   Followed by   Derrill Memo ON 01/28/2021] apixaban  5 mg Oral BID   atorvastatin  80 mg Oral Daily   dapagliflozin propanediol  10 mg Oral Daily   dextromethorphan-guaiFENesin  1 tablet Oral BID   furosemide  60 mg Intravenous Q8H   losartan  12.5 mg Oral Daily   methocarbamol  500 mg Oral Once   metoprolol succinate  12.5 mg Oral Daily   potassium chloride  40 mEq Oral Daily   Continuous Infusions:     LOS: 8 days    Time spent: 35 minutes    Barb Merino, MD [facility] Pager [phone]

## 2021-01-25 NOTE — Progress Notes (Signed)
Progress Note  Patient Name: Joshua Cordova Date of Encounter: 01/25/2021  Primary Cardiologist: Rockey Situ  Subjective   Documented UOP 2.5 L for the past 24 hours, net - 7.2 L for the admission. Renal function starting to trend upwards. Dyspnea continues to improve. Lower extremity swelling is significantly improved. No chest pain. Did not sleep well.   Inpatient Medications    Scheduled Meds:  apixaban  10 mg Oral BID   Followed by   Derrill Memo ON 01/28/2021] apixaban  5 mg Oral BID   atorvastatin  80 mg Oral Daily   dapagliflozin propanediol  10 mg Oral Daily   dextromethorphan-guaiFENesin  1 tablet Oral BID   furosemide  60 mg Intravenous Q8H   losartan  12.5 mg Oral Daily   methocarbamol  500 mg Oral Once   metoprolol succinate  12.5 mg Oral Daily   potassium chloride  40 mEq Oral Daily   Continuous Infusions:  PRN Meds: acetaminophen, albuterol, benzonatate, dextromethorphan-guaiFENesin, hydrALAZINE, ondansetron (ZOFRAN) IV, traZODone   Vital Signs    Vitals:   01/24/21 1924 01/24/21 2351 01/25/21 0542 01/25/21 0754  BP: 117/81 106/82 111/82 102/80  Pulse: 91 86 91 87  Resp: 18 14 14 17   Temp: QA348G F (36.4 C) 97.9 F (36.6 C) 98.8 F (37.1 C) (!) 97.3 F (36.3 C)  TempSrc: Oral Oral Oral   SpO2: 95% 94% 92% 94%  Weight:   92.9 kg   Height:        Intake/Output Summary (Last 24 hours) at 01/25/2021 1128 Last data filed at 01/25/2021 1000 Gross per 24 hour  Intake 1320 ml  Output 3050 ml  Net -1730 ml    Filed Weights   01/23/21 0338 01/24/21 0358 01/25/21 0542  Weight: 96.2 kg 94.5 kg 92.9 kg    Telemetry    Sinus rhythm - personally reviewed  ECG    No new tracings  Physical Exam   GEN: well nourished, well developed, in no acute distress  HEENT: normal  Neck: much improved JVD, carotid bruits, or masses Cardiac: RRR; no murmurs, rubs, or gallops, much improved lower extremity edema with trivial pedal edema noted now  Respiratory:  clear to  auscultation bilaterally, normal work of breathing GI: soft, nontender, nondistended, + BS MS: no deformity or atrophy  Skin: warm and dry Neuro:  strength and sensation are intact Psych: euthymic mood, full affect   Labs    Chemistry Recent Labs  Lab 01/23/21 0813 01/24/21 0413 01/25/21 0658  NA 136 136 135  K 3.9 3.9 4.2  CL 105 100 95*  CO2 23 25 29   GLUCOSE 140* 110* 192*  BUN 43* 47* 48*  CREATININE 1.43* 1.51* 1.67*  CALCIUM 9.2 9.6 9.1  GFRNONAA >60 56* 50*  ANIONGAP 8 11 11       Hematology Recent Labs  Lab 01/21/21 0832 01/22/21 0807 01/25/21 0658  WBC 8.0 8.9 10.4  RBC 4.32 4.42 4.70  HGB 12.8* 13.5 13.9  HCT 39.5 40.7 42.6  MCV 91.4 92.1 90.6  MCH 29.6 30.5 29.6  MCHC 32.4 33.2 32.6  RDW 14.7 14.9 14.4  PLT 232 240 282     Cardiac EnzymesNo results for input(s): TROPONINI in the last 168 hours. No results for input(s): TROPIPOC in the last 168 hours.   BNP No results for input(s): BNP, PROBNP in the last 168 hours.    DDimer  No results for input(s): DDIMER in the last 168 hours.    Radiology  No results found.  Cardiac Studies   2D echo 01/19/2021: 1. Left ventricular ejection fraction, by estimation, is 20 to 25%. The  left ventricle has severely decreased function. The left ventricle  demonstrates global hypokinesis. The left ventricular internal cavity size  was severely dilated. Left ventricular  diastolic parameters are consistent with Grade II diastolic dysfunction  (pseudonormalization). Elevated left atrial pressure.   2. Right ventricular systolic function is moderately reduced. The right  ventricular size is moderately enlarged. There is severely elevated  pulmonary artery systolic pressure.   3. Left atrial size was severely dilated.   4. Right atrial size was severely dilated.   5. The mitral valve is abnormal. Moderate to severe mitral valve  regurgitation.   6. Tricuspid valve regurgitation is moderate.   7. The  aortic valve is tricuspid. Aortic valve regurgitation is mild. No  aortic stenosis is present.   8. The inferior vena cava is dilated in size with <50% respiratory  variability, suggesting right atrial pressure of 15 mmHg.  Patient Profile     52 y.o. 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 Sloan Leiter.  Assessment & Plan    1.  Acute on chronic systolic heart failure with pulmonary hypertension with dilated cardiomyopathy: He has had good UOP and improvement in renal function with escalation of IV diuresis. Renal function is starting to trend upwards.  Decrease IV Lasix to 60 mg bid.  He continues to be volume overloaded. Continue Toprol XL and [place]. Underlying CKD precludes addition of MRA. Blood pressure has precluded transition to [employer]. Escalate GDMT as able. Close monitoring of renal function.   2.  Nonobstructive coronary artery disease: High-sensitivity troponin elevated at 47.  Not consistent with ACS.  Continue Lipitor 80 mg daily. Outpatient follow up.   3.  Recent pulmonary embolism: Continue PTA Eliquis.  4.  Acute on chronic CKD stage III: SCr starting to trend upwards.   For questions or updates, please contact [place] Please consult [email or web address] for contact info under Cardiology/STEMI.    Signed, Allegra Lai, MD 01/25/2021, 11:28 AM

## 2021-01-26 LAB — BASIC METABOLIC PANEL
Anion gap: 10 (ref 5–15)
BUN: 50 mg/dL — ABNORMAL HIGH (ref 6–20)
CO2: 26 mmol/L (ref 22–32)
Calcium: 8.9 mg/dL (ref 8.9–10.3)
Chloride: 94 mmol/L — ABNORMAL LOW (ref 98–111)
Creatinine, Ser: 1.6 mg/dL — ABNORMAL HIGH (ref 0.61–1.24)
GFR, Estimated: 52 mL/min — ABNORMAL LOW (ref >60)
Glucose, Bld: 189 mg/dL — ABNORMAL HIGH (ref 70–99)
Potassium: 4.5 mmol/L (ref 3.5–5.1)
Sodium: 130 mmol/L — ABNORMAL LOW (ref 135–145)

## 2021-01-26 MED ORDER — DAPAGLIFLOZIN PROPANEDIOL 5 MG PO TABS
10.0000 mg | ORAL_TABLET | Freq: Every day | ORAL | Status: DC
  Administered 2021-01-27: 10 mg via ORAL
  Filled 2021-01-26: qty 2

## 2021-01-26 MED ORDER — SODIUM CHLORIDE 0.9% FLUSH
3.0000 mL | Freq: Two times a day (BID) | INTRAVENOUS | Status: DC
  Administered 2021-01-26 – 2021-01-27 (×2): 3 mL via INTRAVENOUS

## 2021-01-26 MED ORDER — TORSEMIDE 20 MG PO TABS
20.0000 mg | ORAL_TABLET | Freq: Every day | ORAL | Status: DC
  Administered 2021-01-26 – 2021-01-27 (×2): 20 mg via ORAL
  Filled 2021-01-26 (×2): qty 1

## 2021-01-26 NOTE — Progress Notes (Signed)
Progress Note  Patient Name: Joshua Cordova Date of Encounter: 01/26/2021  Primary Cardiologist: Rockey Situ  Subjective   No chest pain. Dyspnea and lower extremity swelling much improved. Documented UOP 1.2 L for the past 24 hours, net - 8.5 L for the admission. Weight 92.9-->87 kg over the past 24 hours. Renal function pending. Dyspnea continues to improve. Lower extremity swelling is significantly improved. No chest pain.   Inpatient Medications    Scheduled Meds:  apixaban  10 mg Oral BID   Followed by   Derrill Memo ON 01/28/2021] apixaban  5 mg Oral BID   atorvastatin  80 mg Oral Daily   dapagliflozin propanediol  10 mg Oral Daily   dextromethorphan-guaiFENesin  1 tablet Oral BID   losartan  12.5 mg Oral Daily   methocarbamol  500 mg Oral Once   metoprolol succinate  12.5 mg Oral Daily   potassium chloride  20 mEq Oral Daily   Continuous Infusions:  PRN Meds: acetaminophen, albuterol, benzonatate, dextromethorphan-guaiFENesin, hydrALAZINE, ondansetron (ZOFRAN) IV, traZODone   Vital Signs    Vitals:   01/25/21 2130 01/25/21 2324 01/26/21 0527 01/26/21 0715  BP: 103/78 100/70 112/81 95/75  Pulse: 87 86 93 96  Resp: 19 19 20 17   Temp: 97.6 F (36.4 C) 98.1 F (36.7 C) 97.8 F (36.6 C) 97.6 F (36.4 C)  TempSrc: Oral Oral Oral Oral  SpO2: 95% 98% 95% 95%  Weight:   87 kg   Height:        Intake/Output Summary (Last 24 hours) at 01/26/2021 0823 Last data filed at 01/26/2021 [id number] Gross per 24 hour  Intake 960 ml  Output 2000 ml  Net -1040 ml    Filed Weights   01/24/21 0358 01/25/21 0542 01/26/21 0527  Weight: 94.5 kg 92.9 kg 87 kg    Telemetry    Sinus rhythm - personally reviewed  ECG    No new tracings  Physical Exam   GEN: well nourished, well developed, in no acute distress  HEENT: normal  Neck: much improved JVD, carotid bruits, or masses Cardiac: RRR; no murmurs, rubs, or gallops, much improved lower extremity edema with trivial pedal edema noted  now  Respiratory:  clear to auscultation bilaterally, normal work of breathing GI: soft, nontender, nondistended, + BS MS: no deformity or atrophy  Skin: warm and dry Neuro:  strength and sensation are intact Psych: euthymic mood, full affect   Labs    Chemistry Recent Labs  Lab 01/23/21 0813 01/24/21 0413 01/25/21 0658  NA 136 136 135  K 3.9 3.9 4.2  CL 105 100 95*  CO2 23 25 29   GLUCOSE 140* 110* 192*  BUN 43* 47* 48*  CREATININE 1.43* 1.51* 1.67*  CALCIUM 9.2 9.6 9.1  GFRNONAA >60 56* 50*  ANIONGAP 8 11 11       Hematology Recent Labs  Lab 01/21/21 0832 01/22/21 0807 01/25/21 0658  WBC 8.0 8.9 10.4  RBC 4.32 4.42 4.70  HGB 12.8* 13.5 13.9  HCT 39.5 40.7 42.6  MCV 91.4 92.1 90.6  MCH 29.6 30.5 29.6  MCHC 32.4 33.2 32.6  RDW 14.7 14.9 14.4  PLT 232 240 282     Cardiac EnzymesNo results for input(s): TROPONINI in the last 168 hours. No results for input(s): TROPIPOC in the last 168 hours.   BNP No results for input(s): BNP, PROBNP in the last 168 hours.    DDimer  No results for input(s): DDIMER in the last 168 hours.    Radiology  No results found.  Cardiac Studies   2D echo 01/19/2021: 1. Left ventricular ejection fraction, by estimation, is 20 to 25%. The  left ventricle has severely decreased function. The left ventricle  demonstrates global hypokinesis. The left ventricular internal cavity size  was severely dilated. Left ventricular  diastolic parameters are consistent with Grade II diastolic dysfunction  (pseudonormalization). Elevated left atrial pressure.   2. Right ventricular systolic function is moderately reduced. The right  ventricular size is moderately enlarged. There is severely elevated  pulmonary artery systolic pressure.   3. Left atrial size was severely dilated.   4. Right atrial size was severely dilated.   5. The mitral valve is abnormal. Moderate to severe mitral valve  regurgitation.   6. Tricuspid valve  regurgitation is moderate.   7. The aortic valve is tricuspid. Aortic valve regurgitation is mild. No  aortic stenosis is present.   8. The inferior vena cava is dilated in size with <50% respiratory  variability, suggesting right atrial pressure of 15 mmHg.  Patient Profile     51 y.o. 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 Sloan Leiter.  Assessment & Plan    1.  Acute on chronic systolic heart failure with pulmonary hypertension with dilated cardiomyopathy: He has had good UOP and improvement in renal function with escalation of IV diuresis. Renal function was starting to trend upwards yesterday and remains pending today.  Transition from IV Lasix to torsemide 40 mg daily today. Continue Toprol XL and [place]. Underlying CKD precludes addition of MRA. Blood pressure has precluded transition to [employer]. Escalate GDMT as able. Close monitoring of renal function.   2.  Nonobstructive coronary artery disease: High-sensitivity troponin elevated at 47.  Not consistent with ACS.  Continue Lipitor 80 mg daily. Outpatient follow up.   3.  Recent pulmonary embolism: Continue PTA Eliquis.  4.  Acute on chronic CKD stage III: BMP pending today.   For questions or updates, please contact [place] Please consult [email or web address] for contact info under Cardiology/STEMI.    Signed, Allegra Lai, MD 01/26/2021, 8:23 AM

## 2021-01-26 NOTE — Consult Note (Addendum)
° °  Heart Failure Nurse Navigator Note  HFrEF less than 20%.  Grade 2 diastolic dysfunction.  Right ventricular systolic function is moderately reduced.  Severely elevated pulmonary artery systolic pressures.  Severely dilated left and right atria. Moderate to severe mitral regurgitation.  Moderate tricuspid regurgitation.  Aortic insufficiency is mild to moderate   He presented to the emergency room with complaints of worsening shortness of breath edema going on for approximately 1 month.  CT angio revealed left sided pulmonary embolus.    Comorbidities:  Hypertension Hyperlipidemia Diabetes  Medications:  Eliquis 10 mg 2 times a day Atorvastatin 80 mg daily Farxiga 10 mg daily Losartan 12-1/2 mg daily Metoprolol succinate 12-1/2 mg daily Potassium chloride 20 mEq daily Torsemide 20 mg daily  Labs:  Sodium 130, potassium 4.5, chloride 94, CO2 26, BUN 58, creatinine 1.6, GFR estimate 52.   Initial meeting with the patient on this admission and his mother was also at the bedside.  He is single and lives by himself.  Patient states that he has a scale at home but does not feel that it is accurate.  He also states that he is not working and he has not been able to pay for his medications and his mother has been helping him out.  He states that over the holidays he ate some things he knows that he should not have like a Ruben sandwich.  He states for the most part though he tries to stick with a low-sodium diet and stays within his fluid restriction.  Discussed following up in the outpatient heart failure clinic, he states in the past he did not keep his appointment due to his medical bills.  Stressed the importance of following up in the outpatient heart failure clinic and that Darylene Price the nurse practitioner would be able to help with getting him into assistance programs to aid in getting his medications.  He has an appointment for January 16 at 2:30 in the afternoon he has  a 3% no-show.  Patient states that he also had not been taking his metoprolol at home as he felt he was having side effects of being unable to sleep.  Discussed the importance of speaking with care providers when there are problems with medications rather than just discontinuing them.  He and his mother voiced understanding.    Pricilla Riffle RN [facility]

## 2021-01-26 NOTE — Progress Notes (Signed)
PROGRESS NOTE    Joshua Cordova  [id number] DOB: 11-02-1970 DOA: 01/17/2021 PCP: Venia Carbon, MD    Brief Narrative:  51 year old gentleman with history of chronic congestive heart failure with ejection fraction less than 20%, hypertension, hyperlipidemia, diet-controlled diabetes, presented to the ER with shortness of breath ongoing for about a month.  In the emergency room, he was initially on 2 L oxygen.  CT angiogram showed left upper lobe and middle lobe segmental and subsegmental PE.  Patient was also noted to be grossly fluid overloaded.   Assessment & Plan:   Principal Problem:   Acute pulmonary embolism ([place]) Active Problems:   Essential hypertension   Asthma, allergic, mild intermittent, uncomplicated   Type II diabetes mellitus with renal manifestations (HCC)   Acute on chronic systolic congestive heart failure (HCC)   Elevated troponin   HLD (hyperlipidemia)   CKD (chronic kidney disease), stage IIIa   Mediastinal lymphadenopathy   Hematochezia   Hypotension  Acute pulmonary embolism: Treated with heparin drip.  Now on Eliquis. Lower extremity duplexes were negative for DVT.  Discharge planning on Eliquis.  Stabilized.  Acute on chronic systolic congestive heart failure: Hypoxemia resolved. Presented with fluid overload and hypoxemia.  Echocardiogram with ejection fraction less than 20%.  Recent cardiac cath with no significant coronary artery disease.  Followed by cardiology as outpatient.   Continues to be volume overloaded. Treated with aggressive diuresis, changing to oral torsemide today.  Tolerating losartan and metoprolol.  Potassium is adequate. Will monitor response to oral diuresis today before discharging home as per cardiology recommendations. Strict intake output monitoring.    AKI on CKD stage IIIa: Improving with diuresis.  Monitor daily.  Creatinine 1.5-1.6.   At about baseline.  Type 2 diabetes with CKD stage IIIa: Known A1c 6.8.  Diet  controlled.  Not on treatment.  He will continue low-carb diet.  He is not agreeable to go on any regimen yet.  Started on Farxiga.  Hopefully this will help.  Essential hypertension: Stable.  Tolerating heart failure therapies today.  No additional antihypertensives needed.   DVT prophylaxis: Place and maintain sequential compression device Start: 01/18/21 1113 Place TED hose Start: 01/18/21 1112 apixaban (ELIQUIS) tablet 10 mg  apixaban (ELIQUIS) tablet 5 mg   Code Status: Full code Family Communication: None today. Disposition Plan: Status is: Inpatient  Remains inpatient appropriate because: IV diuresis and challenging with oral diuresis today. Significantly volume overloaded.   Consultants:  Cardiology  Procedures:  None  Antimicrobials:  None   Subjective:  Patient seen and examined.  Just tired.  On room air today.  Able to walk around.  Breathing symptoms improved.  Legs and feet are still swollen.  Feels congested and wants to use some cough medications.  Objective: Vitals:   01/25/21 2130 01/25/21 2324 01/26/21 0527 01/26/21 0715  BP: 103/78 100/70 112/81 95/75  Pulse: 87 86 93 96  Resp: 19 19 20 17   Temp: 97.6 F (36.4 C) 98.1 F (36.7 C) 97.8 F (36.6 C) 97.6 F (36.4 C)  TempSrc: Oral Oral Oral Oral  SpO2: 95% 98% 95% 95%  Weight:   87 kg   Height:        Intake/Output Summary (Last 24 hours) at 01/26/2021 1131 Last data filed at 01/26/2021 0950 Gross per 24 hour  Intake 1080 ml  Output 2000 ml  Net -920 ml   Filed Weights   01/24/21 0358 01/25/21 0542 01/26/21 0527  Weight: 94.5 kg 92.9 kg 87 kg  Examination:  General: Walking in the room.  Looks comfortable.  Not in distress.  On room air today. Cardiovascular: S1-S2 normal.  Regular rate rhythm. Respiratory: Poor air entry at bases.  No added sounds.   Gastrointestinal: Soft and nontender.  Bowel sound present. Both legs with 1+ edema. Dorsum of the feet with 2+ edema.     Data  Reviewed: I have personally reviewed following labs and imaging studies  CBC: Recent Labs  Lab 01/20/21 0626 01/21/21 0832 01/22/21 0807 01/25/21 0658  WBC 7.5 8.0 8.9 10.4  NEUTROABS 4.9  --   --   --   HGB 12.1* 12.8* 13.5 13.9  HCT 36.2* 39.5 40.7 42.6  MCV 90.5 91.4 92.1 90.6  PLT 215 232 240 [id number]   Basic Metabolic Panel: Recent Labs  Lab 01/20/21 0626 01/21/21 0832 01/22/21 0807 01/23/21 0813 01/24/21 0413 01/25/21 0658 01/26/21 0910  NA 136   < > 135 136 136 135 130*  K 3.5   < > 4.0 3.9 3.9 4.2 4.5  CL 104   < > 102 105 100 95* 94*  CO2 22   < > 24 23 25 29 26   GLUCOSE 123*   < > 141* 140* 110* 192* 189*  BUN 40*   < > 40* 43* 47* 48* 50*  CREATININE 1.83*   < > 1.56* 1.43* 1.51* 1.67* 1.60*  CALCIUM 8.3*   < > 8.9 9.2 9.6 9.1 8.9  MG 2.2  --   --   --   --   --   --   PHOS 4.3  --   --   --   --   --   --    < > = values in this interval not displayed.   GFR: Estimated Creatinine Clearance: 59.5 mL/min (A) (by C-G formula based on SCr of 1.6 mg/dL (H)). Liver Function Tests: No results for input(s): AST, ALT, ALKPHOS, BILITOT, PROT, ALBUMIN in the last 168 hours.  No results for input(s): LIPASE, AMYLASE in the last 168 hours.  No results for input(s): AMMONIA in the last 168 hours. Coagulation Profile: No results for input(s): INR, PROTIME in the last 168 hours.  Cardiac Enzymes: No results for input(s): CKTOTAL, CKMB, CKMBINDEX, TROPONINI in the last 168 hours. BNP (last 3 results) No results for input(s): PROBNP in the last 8760 hours. HbA1C: No results for input(s): HGBA1C in the last 72 hours.  CBG: No results for input(s): GLUCAP in the last 168 hours. Lipid Profile: No results for input(s): CHOL, HDL, LDLCALC, TRIG, CHOLHDL, LDLDIRECT in the last 72 hours.  Thyroid Function Tests: No results for input(s): TSH, T4TOTAL, FREET4, T3FREE, THYROIDAB in the last 72 hours. Anemia Panel: No results for input(s): VITAMINB12, FOLATE, FERRITIN, TIBC,  IRON, RETICCTPCT in the last 72 hours. Sepsis Labs: No results for input(s): PROCALCITON, LATICACIDVEN in the last 168 hours.   Recent Results (from the past 240 hour(s))  Resp Panel by RT-PCR (Flu A&B, Covid) Nasopharyngeal Swab     Status: None   Collection Time: 01/17/21  2:53 AM   Specimen: Nasopharyngeal Swab; Nasopharyngeal(NP) swabs in vial transport medium  Result Value Ref Range Status   SARS Coronavirus 2 by RT PCR NEGATIVE NEGATIVE Final    Comment: (NOTE) SARS-CoV-2 target nucleic acids are NOT DETECTED.  The SARS-CoV-2 RNA is generally detectable in upper respiratory specimens during the acute phase of infection. The lowest concentration of SARS-CoV-2 viral copies this assay can detect is 138 copies/mL. A negative  result does not preclude SARS-Cov-2 infection and should not be used as the sole basis for treatment or other patient management decisions. A negative result may occur with  improper specimen collection/handling, submission of specimen other than nasopharyngeal swab, presence of viral mutation(s) within the areas targeted by this assay, and inadequate number of viral copies(<138 copies/mL). A negative result must be combined with clinical observations, patient history, and epidemiological information. The expected result is Negative.  Fact Sheet for Patients:  EntrepreneurPulse.com.au  Fact Sheet for Healthcare Providers:  IncredibleEmployment.be  This test is no t yet approved or cleared by the [place] FDA and  has been authorized for detection and/or diagnosis of SARS-CoV-2 by FDA under an Emergency Use Authorization (EUA). This EUA will remain  in effect (meaning this test can be used) for the duration of the COVID-19 declaration under Section 564(b)(1) of the Act, 21 U.S.C.section 360bbb-3(b)(1), unless the authorization is terminated  or revoked sooner.       Influenza A by PCR NEGATIVE NEGATIVE Final    Influenza B by PCR NEGATIVE NEGATIVE Final    Comment: (NOTE) The Xpert Xpress SARS-CoV-2/FLU/RSV plus assay is intended as an aid in the diagnosis of influenza from Nasopharyngeal swab specimens and should not be used as a sole basis for treatment. Nasal washings and aspirates are unacceptable for Xpert Xpress SARS-CoV-2/FLU/RSV testing.  Fact Sheet for Patients: EntrepreneurPulse.com.au  Fact Sheet for Healthcare Providers: IncredibleEmployment.be  This test is not yet approved or cleared by the [place] FDA and has been authorized for detection and/or diagnosis of SARS-CoV-2 by FDA under an Emergency Use Authorization (EUA). This EUA will remain in effect (meaning this test can be used) for the duration of the COVID-19 declaration under Section 564(b)(1) of the Act, 21 U.S.C. section 360bbb-3(b)(1), unless the authorization is terminated or revoked.  Performed at [facility], [address], [place], [place] [address]          Radiology Studies: No results found.      Scheduled Meds:  apixaban  10 mg Oral BID   Followed by   Derrill Memo ON 01/28/2021] apixaban  5 mg Oral BID   atorvastatin  80 mg Oral Daily   dapagliflozin propanediol  10 mg Oral Daily   dextromethorphan-guaiFENesin  1 tablet Oral BID   losartan  12.5 mg Oral Daily   methocarbamol  500 mg Oral Once   metoprolol succinate  12.5 mg Oral Daily   potassium chloride  20 mEq Oral Daily   torsemide  20 mg Oral Daily   Continuous Infusions:     LOS: 9 days    Time spent: 35 minutes    Barb Merino, MD [facility] Pager [phone]

## 2021-01-27 ENCOUNTER — Other Ambulatory Visit: Payer: Self-pay

## 2021-01-27 DIAGNOSIS — I5023 Acute on chronic systolic (congestive) heart failure: Secondary | ICD-10-CM

## 2021-01-27 LAB — BASIC METABOLIC PANEL
Anion gap: 9 (ref 5–15)
BUN: 45 mg/dL — ABNORMAL HIGH (ref 6–20)
CO2: 26 mmol/L (ref 22–32)
Calcium: 8.9 mg/dL (ref 8.9–10.3)
Chloride: 98 mmol/L (ref 98–111)
Creatinine, Ser: 1.47 mg/dL — ABNORMAL HIGH (ref 0.61–1.24)
GFR, Estimated: 58 mL/min — ABNORMAL LOW (ref >60)
Glucose, Bld: 190 mg/dL — ABNORMAL HIGH (ref 70–99)
Potassium: 4.3 mmol/L (ref 3.5–5.1)
Sodium: 133 mmol/L — ABNORMAL LOW (ref 135–145)

## 2021-01-27 MED ORDER — ATORVASTATIN CALCIUM 80 MG PO TABS
80.0000 mg | ORAL_TABLET | Freq: Every day | ORAL | 0 refills | Status: DC
  Filled 2021-01-27: qty 30, 30d supply, fill #0

## 2021-01-27 MED ORDER — APIXABAN 5 MG PO TABS
10.0000 mg | ORAL_TABLET | Freq: Two times a day (BID) | ORAL | 0 refills | Status: DC
  Filled 2021-01-27: qty 4, 1d supply, fill #0

## 2021-01-27 MED ORDER — LOSARTAN POTASSIUM 25 MG PO TABS
12.5000 mg | ORAL_TABLET | Freq: Every day | ORAL | 0 refills | Status: DC
  Filled 2021-01-27: qty 15, 30d supply, fill #0

## 2021-01-27 MED ORDER — DAPAGLIFLOZIN PROPANEDIOL 10 MG PO TABS
10.0000 mg | ORAL_TABLET | Freq: Every day | ORAL | 0 refills | Status: AC
  Filled 2021-01-27: qty 30, 30d supply, fill #0

## 2021-01-27 MED ORDER — TORSEMIDE 20 MG PO TABS
20.0000 mg | ORAL_TABLET | Freq: Every day | ORAL | 0 refills | Status: AC
Start: 2021-01-28 — End: 2021-07-08
  Filled 2021-01-27: qty 30, 30d supply, fill #0

## 2021-01-27 MED ORDER — METOPROLOL SUCCINATE ER 25 MG PO TB24
12.5000 mg | ORAL_TABLET | Freq: Every day | ORAL | 0 refills | Status: DC
  Filled 2021-01-27: qty 15, 30d supply, fill #0

## 2021-01-27 MED ORDER — APIXABAN 5 MG PO TABS
5.0000 mg | ORAL_TABLET | Freq: Two times a day (BID) | ORAL | 0 refills | Status: DC
  Filled 2021-01-27: qty 64, 31d supply, fill #0

## 2021-01-27 NOTE — Progress Notes (Signed)
Progress Note  Patient Name: Joshua Cordova Date of Encounter: 01/27/2021  Primary Cardiologist: Rockey Situ  Subjective   No chest pain. Dyspnea and lower extremity swelling much improved. Documented UOP +1.2 L for the past 24 hours, net - 7.7 L for the admission. Weight 92.9-->87-->92.6 kg over the past 48 hours, he feels like this is not accurate and requests a repeat weight. Renal function improving following transition from IV Lasix to torsemide. Feels ready for discharge.   Inpatient Medications    Scheduled Meds:  apixaban  10 mg Oral BID   Followed by   Derrill Memo ON 01/28/2021] apixaban  5 mg Oral BID   atorvastatin  80 mg Oral Daily   dapagliflozin propanediol  10 mg Oral Daily   dextromethorphan-guaiFENesin  1 tablet Oral BID   losartan  12.5 mg Oral Daily   methocarbamol  500 mg Oral Once   metoprolol succinate  12.5 mg Oral Daily   potassium chloride  20 mEq Oral Daily   sodium chloride flush  3 mL Intravenous Q12H   torsemide  20 mg Oral Daily   Continuous Infusions:  PRN Meds: acetaminophen, albuterol, benzonatate, dextromethorphan-guaiFENesin, hydrALAZINE, ondansetron (ZOFRAN) IV, traZODone   Vital Signs    Vitals:   01/26/21 2357 01/27/21 0232 01/27/21 0335 01/27/21 0736  BP: 101/83  113/82 113/81  Pulse: 92  100 99  Resp:   20 16  Temp:   97.9 F (36.6 C) 98 F (36.7 C)  TempSrc:    Oral  SpO2:   97% 96%  Weight:  92.6 kg    Height:        Intake/Output Summary (Last 24 hours) at 01/27/2021 0747 Last data filed at 01/27/2021 [id number] Gross per 24 hour  Intake 2425 ml  Output 1600 ml  Net 825 ml    Filed Weights   01/25/21 0542 01/26/21 0527 01/27/21 0232  Weight: 92.9 kg 87 kg 92.6 kg    Telemetry    Sinus rhythm - personally reviewed  ECG    No new tracings  Physical Exam   GEN: well nourished, well developed, in no acute distress  HEENT: normal  Neck: much improved JVD, carotid bruits, or masses Cardiac: RRR; no murmurs, rubs, or  gallops, much improved lower extremity edema with trivial pedal edema noted now  Respiratory:  clear to auscultation bilaterally, normal work of breathing GI: soft, nontender, nondistended, + BS MS: no deformity or atrophy  Skin: warm and dry Neuro:  strength and sensation are intact Psych: euthymic mood, full affect   Labs    Chemistry Recent Labs  Lab 01/25/21 0658 01/26/21 0910 01/27/21 0408  NA 135 130* 133*  K 4.2 4.5 4.3  CL 95* 94* 98  CO2 29 26 26   GLUCOSE 192* 189* 190*  BUN 48* 50* 45*  CREATININE 1.67* 1.60* 1.47*  CALCIUM 9.1 8.9 8.9  GFRNONAA 50* 52* 58*  ANIONGAP 11 10 9       Hematology Recent Labs  Lab 01/21/21 0832 01/22/21 0807 01/25/21 0658  WBC 8.0 8.9 10.4  RBC 4.32 4.42 4.70  HGB 12.8* 13.5 13.9  HCT 39.5 40.7 42.6  MCV 91.4 92.1 90.6  MCH 29.6 30.5 29.6  MCHC 32.4 33.2 32.6  RDW 14.7 14.9 14.4  PLT 232 240 282     Cardiac EnzymesNo results for input(s): TROPONINI in the last 168 hours. No results for input(s): TROPIPOC in the last 168 hours.   BNP No results for input(s): BNP, PROBNP in the  last 168 hours.    DDimer  No results for input(s): DDIMER in the last 168 hours.    Radiology    No results found.  Cardiac Studies   2D echo 01/19/2021: 1. Left ventricular ejection fraction, by estimation, is 20 to 25%. The  left ventricle has severely decreased function. The left ventricle  demonstrates global hypokinesis. The left ventricular internal cavity size  was severely dilated. Left ventricular  diastolic parameters are consistent with Grade II diastolic dysfunction  (pseudonormalization). Elevated left atrial pressure.   2. Right ventricular systolic function is moderately reduced. The right  ventricular size is moderately enlarged. There is severely elevated  pulmonary artery systolic pressure.   3. Left atrial size was severely dilated.   4. Right atrial size was severely dilated.   5. The mitral valve is abnormal.  Moderate to severe mitral valve  regurgitation.   6. Tricuspid valve regurgitation is moderate.   7. The aortic valve is tricuspid. Aortic valve regurgitation is mild. No  aortic stenosis is present.   8. The inferior vena cava is dilated in size with <50% respiratory  variability, suggesting right atrial pressure of 15 mmHg.  Patient Profile     51 y.o. 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 Sloan Leiter.  Assessment & Plan    1.  Acute on chronic systolic heart failure with pulmonary hypertension with dilated cardiomyopathy: He has had good UOP and improvement in renal function with IV diuresis. Continue torsemide 20 mg daily along with losartan,Toprol XL, and Farxiga. Underlying CKD precludes addition of MRA. Blood pressure has precluded transition to [facility], this can be revisited in the office. Escalate GDMT as able in the outpatient setting. He should be able to be discharged today on current cardiac medications. I will arrange for follow up in our office. Recommend he establish with the advanced heart failure clinic in [place] and would also benefit from being evaluated by EP for consideration of ICD in the outpatient setting.   2.  Nonobstructive coronary artery disease: High-sensitivity troponin elevated at 47.  Not consistent with ACS.  Continue Lipitor 80 mg daily. Outpatient follow up.   3.  Recent pulmonary embolism: Continue PTA Eliquis.  4.  Acute on chronic CKD stage III: Stable to improved renal function.   For questions or updates, please contact [place] Please consult [email or web address] for contact info under Cardiology/STEMI.    Signed, Allegra Lai, MD 01/27/2021, 7:47 AM

## 2021-01-27 NOTE — Discharge Summary (Signed)
Physician Discharge Summary  Joshua Cordova [id number] DOB: [birth year] DOA: 01/17/2021  PCP: Venia Carbon, MD  Admit date: 01/17/2021 Discharge date: 01/27/2021  Admitted From: home  Disposition:  home   Recommendations for Outpatient Follow-up:  Follow up with PCP in 1-2 weeks F/u w/ cardio, Dr. Rockey Situ, in 1 week   Home Health: no  Equipment/Devices:  Discharge Condition: stable  CODE STATUS: full  Diet recommendation: Heart Healthy / Carb Modified   Brief/Interim Summary: HPI was taken from Dr. Blaine Hamper: Joshua Cordova is a 51 y.o. male with medical history significant of CHF with EF <20%, hypertension, hyperlipidemia, diet-controlled diabetes, asthma, CKD-3A, who presents with shortness of breath   Patient states that she has a short breath for about 1 week, which has been progressively worsening.  Patient has cough with a little dark mucus production.  No chest pain.  No fever or chills.  Patient denies nausea, vomiting, diarrhea or abdominal pain.  No symptoms of UTI.  No recent traveling.  No tenderness in the calf areas.   ED Course: pt was found to have positive D-dimer 1.54, negative COVID PCR, BNP 761, troponin level 47 --> 47, INR 1.1, PTT 35, renal function close to baseline, WBC 8.1, liver function (ALP 89, AST 27, ALT 20, total bilirubin 1.4), temperature normal, blood pressure 114/86, heart rate 107, RR 22, oxygen saturation 93-95% on room air.  CT angio showed PE with small clot burden.  Lower extremity venous Dopplers negative for DVT. Patient is admitted to [facility] bed as inpatient.   CTA: 1. Left lower lobe segmental and subsegmental emboli, few suspected left upper lobe subsegmental emboli, overall small clot burden. 2. Moderate panchamber cardiomegaly with venous distention, pulmonary arterial prominence and IVC reflux, the latter which could be due to right heart strain, tricuspid regurgitation or rapid contrast injection. 3. Small pericardial effusion, with heavy  three-vessel calcific CAD. Aortic atherosclerosis. 4. Mediastinal and hilar adenopathy. Further evaluation recommended. 5. Patchy ground-glass disease in the posterior lingula which could be due to pulmonary infarction, pneumonia or asymmetric edema. Faint perihilar haziness is most likely ground-glass edema and there are trace pleural effusions. 6. Cholelithiasis, with pericholecystic edema which could be congestive or due to cholecystitis. Please correlate clinically. 7. Results phoned to Dr. Cyril Mourning Ward at 5:47 a.m., 01/17/2021.   US-RUQ: Gallstones and gallbladder wall thickening. Cholecystitis scratch cholecystitis cannot be excluded. If further imaging is clinically indicated consider nuclear medicine hepatic biliary scan to assess the patency of the cystic duct.   As per Dr. Sloan Leiter: 51 year old gentleman with history of chronic congestive heart failure with ejection fraction less than 20%, hypertension, hyperlipidemia, diet-controlled diabetes, presented to the ER with shortness of breath ongoing for about a month.  In the emergency room, he was initially on 2 L oxygen.  CT angiogram showed left upper lobe and middle lobe segmental and subsegmental PE.  Patient was also noted to be grossly fluid overloaded.  As per Dr. Jimmye Norman 01/27/21: Pt was medically stable to d/c home w/ torsemide, losartan, metoprolol XL, farxiga and eliquis. Pt will f/u w/ cardio, Dr. Rockey Situ, in 1 week. For more information, please see previous progress/consult notes.     Discharge Diagnoses:  Principal Problem:   Acute pulmonary embolism ([place]) Active Problems:   Essential hypertension   Asthma, allergic, mild intermittent, uncomplicated   Type II diabetes mellitus with renal manifestations (HCC)   Acute on chronic systolic congestive heart failure (HCC)   Elevated troponin   HLD (hyperlipidemia)   CKD (  chronic kidney disease), stage IIIa   Mediastinal lymphadenopathy   Hematochezia    Hypotension  Acute pulmonary embolism: continue on eliquis. [place] of b/l LE neg for DVT.    Acute on chronic systolic CHF: echo showed EF < 20%. Recent cardiac cath with no significant CAD. Continue on metoprolol, torsemide, losartan, & farxiga. Cardio following and recs apprec   AKI on CKDIIIa: baseline Cr around 1.5-1.6. Cr is trending down today   DMII: well controlled, HbA1c 6.8.  Continue on farxiga   HTN: continue on metoprolol, losartan  Overweight: BMI 28.4. Would benefit from weight loss    Discharge Instructions  Discharge Instructions     Diet - low sodium heart healthy   Complete by: As directed    Diet Carb Modified   Complete by: As directed    Discharge instructions   Complete by: As directed    F/u w/ cardio, Dr. Rockey Situ, in 1 week. F/u w/ PCP in 1-2 weeks   Increase activity slowly   Complete by: As directed       Allergies as of 01/27/2021   No Known Allergies      Medication List     STOP taking these medications    furosemide 80 MG tablet Commonly known as: LASIX   metoprolol tartrate 25 MG tablet Commonly known as: LOPRESSOR       TAKE these medications    albuterol 108 (90 Base) MCG/ACT inhaler Commonly known as: VENTOLIN HFA TAKE 2 PUFFS BY MOUTH EVERY 6 HOURS AS NEEDED FOR WHEEZE OR SHORTNESS OF BREATH   apixaban 5 MG Tabs tablet Commonly known as: ELIQUIS Take 2 tablets (10 mg total) by mouth 2 (two) times daily for 1 day.   apixaban 5 MG Tabs tablet Commonly known as: ELIQUIS Take 1 tablet (5 mg total) by mouth 2 (two) times daily. Only start this script after finishing up the eliquis 10mg  BID x 1 day more Start taking on: January 28, 2021   atorvastatin 80 MG tablet Commonly known as: LIPITOR Take 1 tablet (80 mg total) by mouth daily. What changed:  medication strength how much to take   dapagliflozin propanediol 10 MG Tabs tablet Commonly known as: FARXIGA Take 1 tablet (10 mg total) by mouth daily. Start taking on:  January 28, 2021   losartan 25 MG tablet Commonly known as: COZAAR Take 0.5 tablets (12.5 mg total) by mouth daily. Start taking on: January 28, 2021 What changed: how much to take   metoprolol succinate 25 MG 24 hr tablet Commonly known as: TOPROL-XL Take 0.5 tablets (12.5 mg total) by mouth daily. Start taking on: January 28, 2021   torsemide 20 MG tablet Commonly known as: DEMADEX Take 1 tablet (20 mg total) by mouth daily. Start taking on: January 28, 2021        Follow-up Information     Gollan, Kathlene November, MD Follow up in 1 week(s).   Specialty: Cardiology Contact information: [place] [address] [phone]                No Known Allergies  Consultations: Cardio    Procedures/Studies: DG Chest 2 View  Result Date: 01/17/2021 CLINICAL DATA:  Dyspnea EXAM: CHEST - 2 VIEW COMPARISON:  12/15/2020 FINDINGS: The lungs are symmetrically well expanded. There is interval development of mild streaky infiltrate within the retrocardiac left lower lobe, new since prior examination, possibly infectious or inflammatory. The lungs are otherwise clear. No pneumothorax or pleural  effusion. Cardiac size is mildly enlarged, unchanged. Pulmonary vascularity is normal. No acute bone abnormality. IMPRESSION: Sparse infiltrate within the left lower lobe, possibly infectious in the acute setting. Electronically Signed   By: Fidela Salisbury M.D.   On: 01/17/2021 01:11   CT Angio Chest PE W and/or Wo Contrast  Result Date: 01/17/2021 CLINICAL DATA:  Positive D-dimer suspected pulmonary embolism. EXAM: CT ANGIOGRAPHY CHEST WITH CONTRAST TECHNIQUE: Multidetector CT imaging of the chest was performed using the standard protocol during bolus administration of intravenous contrast. Multiplanar CT image reconstructions and MIPs were obtained to evaluate the vascular anatomy. CONTRAST:  38mL OMNIPAQUE IOHEXOL 350 MG/ML SOLN COMPARISON:  PA Lat chest 12/15/2020,  10/31/2019. No prior cross-sectional imaging. FINDINGS: Cardiovascular: There is moderate panchamber cardiomegaly with a small superior recess pericardial effusion, distended superior pulmonary veins, three-vessel heavy calcific CAD and mild aortic atherosclerosis extending into branch arteries. Aortic opacification is insufficient to exclude dissection. There is ectasia of the aortic root to 3.9 cm, remainder is within normal caliber limits. There is a prominent pulmonary trunk 3.4 cm indicating arterial hypertension. Right-sided arteries appear to be free of thrombus. On the left, there are segmental and subsegmental arterial filling defects in the lower lobe and there suspected few subsegmental arterial filling defects in portions of the upper lobe, but with overall small clot burden. There is IVC reflux which may indicate right heart strain, tricuspid regurgitation or rapid contrast injection but the right chambers are not larger than the left. Mediastinum/Nodes: There is mediastinal adenopathy in the right paratracheal and prevascular regions with lymph nodes in both locations up to 1.5 cm in short axis. There bilateral mildly prominent hilar lymph nodes up to 1.2 cm in short axis on the left and up to 1.4 cm in short axis on the right. The lower poles of the thyroid gland are unremarkable. There is mild subareolar gynecomastia. Axillary spaces are clear and no esophageal mass is seen. Lungs/Pleura: There are patchy moderate ground-glass opacities posteriorly in the lingula. There are faint perihilar hazy opacities which are probably due to ground-glass edema. There are trace pleural effusions. The trachea and central airways are clear Upper Abdomen: No acute abnormality. Liver is mildly steatotic. There are stones in the gallbladder. Pericholecystic edema is present. No upper abdominal free fluid. Musculoskeletal: No acute chest wall abnormality. No acute or significant osseous findings. Review of the MIP  images confirms the above findings. IMPRESSION: 1. Left lower lobe segmental and subsegmental emboli, few suspected left upper lobe subsegmental emboli, overall small clot burden. 2. Moderate panchamber cardiomegaly with venous distention, pulmonary arterial prominence and IVC reflux, the latter which could be due to right heart strain, tricuspid regurgitation or rapid contrast injection. 3. Small pericardial effusion, with heavy three-vessel calcific CAD. Aortic atherosclerosis. 4. Mediastinal and hilar adenopathy. Further evaluation recommended. 5. Patchy ground-glass disease in the posterior lingula which could be due to pulmonary infarction, pneumonia or asymmetric edema. Faint perihilar haziness is most likely ground-glass edema and there are trace pleural effusions. 6. Cholelithiasis, with pericholecystic edema which could be congestive or due to cholecystitis. Please correlate clinically. 7. Results phoned to Dr. Cyril Mourning Ward at 5:47 a.m., 01/17/2021. Electronically Signed   By: Telford Nab M.D.   On: 01/17/2021 05:49   US Venous Img Lower Bilateral (DVT)  Result Date: 01/17/2021 CLINICAL DATA:  Rule out DVT. EXAM: BILATERAL LOWER EXTREMITY VENOUS DOPPLER ULTRASOUND TECHNIQUE: Gray-scale sonography with graded compression, as well as color Doppler and duplex ultrasound were performed to evaluate  the lower extremity deep venous systems from the level of the common femoral vein and including the common femoral, femoral, profunda femoral, popliteal and calf veins including the posterior tibial, peroneal and gastrocnemius veins when visible. The superficial great saphenous vein was also interrogated. Spectral Doppler was utilized to evaluate flow at rest and with distal augmentation maneuvers in the common femoral, femoral and popliteal veins. COMPARISON:  None. FINDINGS: RIGHT LOWER EXTREMITY Common Femoral Vein: No evidence of thrombus. Normal compressibility, respiratory phasicity and response to  augmentation. Saphenofemoral Junction: No evidence of thrombus. Normal compressibility and flow on color Doppler imaging. Profunda Femoral Vein: No evidence of thrombus. Normal compressibility and flow on color Doppler imaging. Femoral Vein: No evidence of thrombus. Normal compressibility, respiratory phasicity and response to augmentation. Popliteal Vein: No evidence of thrombus. Normal compressibility, respiratory phasicity and response to augmentation. Calf Veins: No evidence of thrombus. Normal compressibility and flow on color Doppler imaging. Superficial Great Saphenous Vein: No evidence of thrombus. Normal compressibility. Venous Reflux:  None. Other Findings:  None. LEFT LOWER EXTREMITY Common Femoral Vein: No evidence of thrombus. Normal compressibility, respiratory phasicity and response to augmentation. Saphenofemoral Junction: No evidence of thrombus. Normal compressibility and flow on color Doppler imaging. Profunda Femoral Vein: No evidence of thrombus. Normal compressibility and flow on color Doppler imaging. Femoral Vein: No evidence of thrombus. Normal compressibility, respiratory phasicity and response to augmentation. Popliteal Vein: No evidence of thrombus. Normal compressibility, respiratory phasicity and response to augmentation. Calf Veins: No evidence of thrombus. Normal compressibility and flow on color Doppler imaging. Superficial Great Saphenous Vein: No evidence of thrombus. Normal compressibility. Venous Reflux:  None. Other Findings:  None. IMPRESSION: No evidence of deep venous thrombosis in either lower extremity. Electronically Signed   By: Kerby Moors M.D.   On: 01/17/2021 07:14   ECHOCARDIOGRAM COMPLETE  Result Date: 01/19/2021    ECHOCARDIOGRAM REPORT   Patient Name:   Joshua Cordova Date of Exam: 01/19/2021 Medical Rec #:  [id number]   Height:       71.0 in Accession #:    [id number]  Weight:       209.8 lb Date of Birth:  04-24-1970  BSA:          2.152 m Patient Age:    51  years    BP:           103/82 mmHg Patient Gender: M           HR:           94 bpm. Exam Location:  [facility] Procedure: 2D Echo, Cardiac Doppler and Color Doppler Indications:     CHF I50.9  History:         Patient has prior history of Echocardiogram examinations, most                  recent 10/31/2020. Risk Factors:Hypertension. Chronic systolic                  heart failure, Non-ischemic cardiomyopathy.  Sonographer:     Sherrie Sport Referring Phys:  [id number] Barb Merino Diagnosing Phys: Nelva Bush MD IMPRESSIONS  1. Left ventricular ejection fraction, by estimation, is 20 to 25%. The left ventricle has severely decreased function. The left ventricle demonstrates global hypokinesis. The left ventricular internal cavity size was severely dilated. Left ventricular diastolic parameters are consistent with Grade II diastolic dysfunction (pseudonormalization). Elevated left atrial pressure.  2. Right ventricular systolic function is moderately reduced. The right ventricular size is  moderately enlarged. There is severely elevated pulmonary artery systolic pressure.  3. Left atrial size was severely dilated.  4. Right atrial size was severely dilated.  5. The mitral valve is abnormal. Moderate to severe mitral valve regurgitation.  6. Tricuspid valve regurgitation is moderate.  7. The aortic valve is tricuspid. Aortic valve regurgitation is mild. No aortic stenosis is present.  8. The inferior vena cava is dilated in size with <50% respiratory variability, suggesting right atrial pressure of 15 mmHg. FINDINGS  Left Ventricle: Left ventricular ejection fraction, by estimation, is 20 to 25%. The left ventricle has severely decreased function. The left ventricle demonstrates global hypokinesis. The left ventricular internal cavity size was severely dilated. There is no left ventricular hypertrophy. Left ventricular diastolic parameters are consistent with Grade II diastolic dysfunction (pseudonormalization). Elevated  left atrial pressure. Right Ventricle: The right ventricular size is moderately enlarged. No increase in right ventricular wall thickness. Right ventricular systolic function is moderately reduced. There is severely elevated pulmonary artery systolic pressure. The tricuspid regurgitant velocity is 4.02 m/s, and with an assumed right atrial pressure of 15 mmHg, the estimated right ventricular systolic pressure is 123456 mmHg. Left Atrium: Left atrial size was severely dilated. Right Atrium: Right atrial size was severely dilated. Pericardium: There is no evidence of pericardial effusion. Mitral Valve: The mitral valve is abnormal. There is mild thickening of the mitral valve leaflet(s). Moderate to severe mitral valve regurgitation. Tricuspid Valve: The tricuspid valve is not well visualized. Tricuspid valve regurgitation is moderate. Aortic Valve: The aortic valve is tricuspid. Aortic valve regurgitation is mild. No aortic stenosis is present. Aortic valve mean gradient measures 2.0 mmHg. Aortic valve peak gradient measures 2.5 mmHg. Aortic valve area, by VTI measures 2.29 cm. Pulmonic Valve: The pulmonic valve was normal in structure. Pulmonic valve regurgitation is trivial. No evidence of pulmonic stenosis. Aorta: The aortic root is normal in size and structure. Pulmonary Artery: The pulmonary artery is not well seen. Venous: The inferior vena cava is dilated in size with less than 50% respiratory variability, suggesting right atrial pressure of 15 mmHg. IAS/Shunts: The interatrial septum was not well visualized.  LEFT VENTRICLE PLAX 2D LVIDd:         7.80 cm      Diastology LVIDs:         7.40 cm      LV e' medial:   4.13 cm/s LV PW:         0.70 cm      LV E/e' medial: 21.7 LV IVS:        0.90 cm LVOT diam:     2.10 cm LV SV:         19 LV SV Index:   9 LVOT Area:     3.46 cm  LV Volumes (MOD) LV vol d, MOD A2C: 345.0 ml LV vol d, MOD A4C: 309.0 ml LV vol s, MOD A2C: 258.0 ml LV vol s, MOD A4C: 205.0 ml LV SV  MOD A2C:     87.0 ml LV SV MOD A4C:     309.0 ml LV SV MOD BP:      100.1 ml RIGHT VENTRICLE RV Basal diam:  4.90 cm LEFT ATRIUM              Index        RIGHT ATRIUM           Index LA diam:        5.80 cm  2.70 cm/m  RA Area:     32.00 cm LA Vol (A2C):   144.0 ml 66.92 ml/m  RA Volume:   118.00 ml 54.84 ml/m LA Vol (A4C):   104.0 ml 48.33 ml/m LA Biplane Vol: 124.0 ml 57.62 ml/m  AORTIC VALVE                    PULMONIC VALVE AV Area (Vmax):    1.72 cm     PV Vmax:          0.35 m/s AV Area (Vmean):   1.51 cm     PV Vmean:         23.300 cm/s AV Area (VTI):     2.29 cm     PV VTI:           0.036 m AV Vmax:           78.90 cm/s   PV Peak grad:     0.5 mmHg AV Vmean:          58.400 cm/s  PV Mean grad:     0.0 mmHg AV VTI:            0.081 m      PR End Diast Vel: 13.99 msec AV Peak Grad:      2.5 mmHg     RVOT Peak grad:   1 mmHg AV Mean Grad:      2.0 mmHg LVOT Vmax:         39.20 cm/s LVOT Vmean:        25.500 cm/s LVOT VTI:          0.054 m LVOT/AV VTI ratio: 0.66  AORTA Ao Root diam: 3.27 cm MITRAL VALVE               TRICUSPID VALVE MV Area (PHT): 8.25 cm    TR Peak grad:   64.6 mmHg MV Decel Time: 92 msec     TR Vmax:        402.00 cm/s MV E velocity: 89.50 cm/s MV A velocity: 45.00 cm/s  SHUNTS MV E/A ratio:  1.99        Systemic VTI:  0.05 m                            Systemic Diam: 2.10 cm                            Pulmonic VTI:  0.072 m Nelva Bush MD Electronically signed by Nelva Bush MD Signature Date/Time: 01/19/2021/4:45:21 PM    Final    US ABDOMEN LIMITED RUQ (LIVER/GB)  Result Date: 01/17/2021 CLINICAL DATA:  Right upper quadrant pain EXAM: ULTRASOUND ABDOMEN LIMITED RIGHT UPPER QUADRANT COMPARISON:  CT angio chest from earlier today FINDINGS: Gallbladder: Gallbladder wall thickening is identified measuring up to 4 mm. Gallstones are noted. These measure up to 1.2 cm. No pericholecystic fluid or sonographic Murphy's sign. Common bile duct: Diameter: 4.5 mm Liver: No focal  lesion identified. Within normal limits in parenchymal echogenicity. Portal vein is patent on color Doppler imaging with normal direction of blood flow towards the liver. Other: None. IMPRESSION: Gallstones and gallbladder wall thickening. Cholecystitis scratch cholecystitis cannot be excluded. If further imaging is clinically indicated consider nuclear medicine hepatic biliary scan to assess the patency of the cystic duct. Electronically Signed   By: Kerby Moors M.D.   On: 01/17/2021 07:07   (  Echo, Carotid, EGD, Colonoscopy, ERCP)    Subjective: Pt c/o not sleeping well    Discharge Exam: Vitals:   01/27/21 0335 01/27/21 0736  BP: 113/82 113/81  Pulse: 100 99  Resp: 20 16  Temp: 97.9 F (36.6 C) 98 F (36.7 C)  SpO2: 97% 96%   Vitals:   01/26/21 2357 01/27/21 0232 01/27/21 0335 01/27/21 0736  BP: 101/83  113/82 113/81  Pulse: 92  100 99  Resp:   20 16  Temp:   97.9 F (36.6 C) 98 F (36.7 C)  TempSrc:    Oral  SpO2:   97% 96%  Weight:  92.6 kg    Height:        General: Pt is alert, awake, not in acute distress Cardiovascular: S1/S2 +, no rubs, no gallops Respiratory: CTA bilaterally, no wheezing, no rhonchi Abdominal: Soft, NT, ND, bowel sounds + Extremities: no edema, no cyanosis    The results of significant diagnostics from this hospitalization (including imaging, microbiology, ancillary and laboratory) are listed below for reference.     Microbiology: No results found for this or any previous visit (from the past 240 hour(s)).   Labs: BNP (last 3 results) Recent Labs    10/22/20 1229 01/17/21 0041  BNP 217.1* [id number]*   Basic Metabolic Panel: Recent Labs  Lab 01/23/21 0813 01/24/21 0413 01/25/21 0658 01/26/21 0910 01/27/21 0408  NA 136 136 135 130* 133*  K 3.9 3.9 4.2 4.5 4.3  CL 105 100 95* 94* 98  CO2 23 25 29 26 26   GLUCOSE 140* 110* 192* 189* 190*  BUN 43* 47* 48* 50* 45*  CREATININE 1.43* 1.51* 1.67* 1.60* 1.47*  CALCIUM 9.2 9.6 9.1 8.9  8.9   Liver Function Tests: No results for input(s): AST, ALT, ALKPHOS, BILITOT, PROT, ALBUMIN in the last 168 hours. No results for input(s): LIPASE, AMYLASE in the last 168 hours. No results for input(s): AMMONIA in the last 168 hours. CBC: Recent Labs  Lab 01/21/21 0832 01/22/21 0807 01/25/21 0658  WBC 8.0 8.9 10.4  HGB 12.8* 13.5 13.9  HCT 39.5 40.7 42.6  MCV 91.4 92.1 90.6  PLT 232 240 282   Cardiac Enzymes: No results for input(s): CKTOTAL, CKMB, CKMBINDEX, TROPONINI in the last 168 hours. BNP: Invalid input(s): POCBNP CBG: No results for input(s): GLUCAP in the last 168 hours. D-Dimer No results for input(s): DDIMER in the last 72 hours. Hgb A1c No results for input(s): HGBA1C in the last 72 hours. Lipid Profile No results for input(s): CHOL, HDL, LDLCALC, TRIG, CHOLHDL, LDLDIRECT in the last 72 hours. Thyroid function studies No results for input(s): TSH, T4TOTAL, T3FREE, THYROIDAB in the last 72 hours.  Invalid input(s): FREET3 Anemia work up No results for input(s): VITAMINB12, FOLATE, FERRITIN, TIBC, IRON, RETICCTPCT in the last 72 hours. Urinalysis    Component Value Date/Time   COLORURINE YELLOW (A) 11/01/2019 0055   APPEARANCEUR HAZY (A) 11/01/2019 0055   LABSPEC 1.022 11/01/2019 0055   PHURINE 5.0 11/01/2019 0055   GLUCOSEU NEGATIVE 11/01/2019 0055   HGBUR SMALL (A) 11/01/2019 0055   BILIRUBINUR NEGATIVE 11/01/2019 0055   KETONESUR NEGATIVE 11/01/2019 0055   PROTEINUR >=300 (A) 11/01/2019 0055   NITRITE NEGATIVE 11/01/2019 0055   LEUKOCYTESUR NEGATIVE 11/01/2019 0055   Sepsis Labs Invalid input(s): PROCALCITONIN,  WBC,  LACTICIDVEN Microbiology No results found for this or any previous visit (from the past 240 hour(s)).   Time coordinating discharge: Over 30 minutes  SIGNED:   Wyvonnia Dusky,  MD  [facility] 01/27/2021, 11:31 AM Pager   If 7PM-7AM, please contact night-coverage

## 2021-01-27 NOTE — Progress Notes (Addendum)
° °  Heart Failure Nurse Navigator Note   Met again today with patient and his mother who was at the bedside.  He was given a scale for weighing himself at home, along with low-sodium cookbook and information living with heart failure teaching booklet, low-sodium information, weight chart and zone magnet.  Went over the magnet and signs and symptoms along with weight gain to report to physician.  They both voiced understanding.  Patient ask about wearing Ted stockings, he does have them ordered, I have made the nurse aware.  Went over the procedure of putting them on first thing in the morning and moving them just before going to bed.  He voices understanding.  Per cardiology's notes they plan to plug him into the advanced heart failure clinic in [place] consideration of ICD.  They do not have any further questions at this time.  Secure chatted with Dr. Jimmye Norman, Erlene Quan beers with pharmacy, Loletha Grayer Case manager to make them aware that patient would benefit from medication management clinic as he is a self-pay and mother has been paying for his medications.  Pricilla Riffle RN CHFN

## 2021-01-28 ENCOUNTER — Telehealth: Payer: Self-pay | Admitting: Cardiovascular Disease

## 2021-01-28 ENCOUNTER — Other Ambulatory Visit: Payer: Self-pay

## 2021-01-28 ENCOUNTER — Telehealth: Payer: Self-pay

## 2021-01-28 NOTE — Telephone Encounter (Signed)
Spoke with the patient.  Patient was d/c yesterday from [facility]. He was able to pick up all of his medications yesterday for medication management. He reports taking his medications as prescribed.  Patient sts that his weight this morning was stable at 203 lbs. His LE swelling has continued to improve after d/c and is almost back to his baseline.  Patient denies sob at rest or with exertion, PND. His does endorse orthopnea when he lays down at night to sleep. He is sleeping with just one pillow, adv the patient to add additional pillows to the bed so that his head is at a 30 degree angle. Patient also reports hypotension  after taking his medications in the morning. Suggested that he space them out and take his losartan at lunch time. Patient appt with [name] has been moved up to 02/02/21.  Adv the patient to continue daily weights and to call the office if his weight is up 3lbs overnight or 5lbs in week. He is to contact the office if no improvement with his orthopnea with the use of additional pillows.  Adv the patient that I will fwd the update to [name] to see if he has any additional recommendation in the interim. Patient agreeable with the plan and voiced appreciation for the assistance.

## 2021-01-28 NOTE — Telephone Encounter (Signed)
Pt c/o Shortness Of Breath: STAT if SOB developed within the last 24 hours or pt is noticeably SOB on the phone  1. Are you currently SOB (can you hear that pt is SOB on the phone)? Yes not audible   2. How long have you been experiencing SOB? Since right before hospital dc   3. Are you SOB when sitting or when up moving around? When trying to go to sleep   4. Are you currently experiencing any other symptoms? Hypotension per patient he is not sure if supposed to be taking both bp meds together .  Reports bp today is 96/71

## 2021-01-28 NOTE — Progress Notes (Signed)
°   01/25/21 1932  Assess: MEWS Score  Temp 97.8 F (36.6 C)  BP 95/74  Pulse Rate 87  ECG Heart Rate 89  Resp (!) 21  Level of Consciousness Alert  SpO2 96 %  O2 Device Room Air  Assess: MEWS Score  MEWS Temp 0  MEWS Systolic 1  MEWS Pulse 0  MEWS RR 1  MEWS LOC 0  MEWS Score 2  MEWS Score Color Yellow  Assess: if the MEWS score is Yellow or Red  Were vital signs taken at a resting state? Yes  Focused Assessment No change from prior assessment  Does the patient meet 2 or more of the SIRS criteria? No  MEWS guidelines implemented *See Row Information* Yes  Treat  MEWS Interventions Other (Comment) (Continue to monitor)  Pain Scale 0-10  Pain Score 0  Take Vital Signs  Increase Vital Sign Frequency  Yellow: Q 2hr X 2 then Q 4hr X 2, if remains yellow, continue Q 4hrs  Escalate  MEWS: Escalate Yellow: discuss with charge nurse/RN and consider discussing with provider and RRT  Notify: Charge Nurse/RN  Name of Charge Nurse/RN Notified Lillia Abed RN  Date Charge Nurse/RN Notified 01/25/21  Time Charge Nurse/RN Notified 1950  Assess: SIRS CRITERIA  SIRS Temperature  0  SIRS Pulse 0  SIRS Respirations  1  SIRS WBC 0  SIRS Score Sum  1

## 2021-01-28 NOTE — Telephone Encounter (Signed)
Transition Care Management Follow-up Telephone Call Date of discharge and from where:TCM DC [facility] 01-27-21 Dx: acute PE  How have you been since you were released from the hospital? Feeling a little winded  Any questions or concerns? No  Items Reviewed: Did the pt receive and understand the discharge instructions provided? Yes  Medications obtained and verified? Yes  Other? No  Any new allergies since your discharge? No  Dietary orders reviewed? Yes Do you have support at home? Yes   Home Care and Equipment/Supplies: Were home health services ordered? no If so, what is the name of the agency? na  Has the agency set up a time to come to the patient's home? not applicable Were any new equipment or medical supplies ordered?  No What is the name of the medical supply agency? na Were you able to get the supplies/equipment? not applicable Do you have any questions related to the use of the equipment or supplies? No  Functional Questionnaire: (I = Independent and D = Dependent) ADLs: I  Bathing/Dressing- I  Meal Prep- I  Eating- I  Maintaining continence- I  Transferring/Ambulation- I  Managing Meds- I  Follow up appointments reviewed:  PCP Hospital f/u appt confirmed? No  Office will need to reach out to schedule- no open appts until 02-19-21- pt only wants to see Dr Alphonsus Sias. Specialist Hospital f/u appt confirmed? Yes  Scheduled to see Dr Bing Neighbors on 02-01-21 @ 230pm. Are transportation arrangements needed? No  If their condition worsens, is the pt aware to call PCP or go to the Emergency Dept.? Yes Was the patient provided with contact information for the PCP's office or ED? Yes Was to pt encouraged to call back with questions or concerns? Yes

## 2021-01-28 NOTE — Telephone Encounter (Signed)
Okay to hold off on appt with me since he is going to CHF clinic. He doesn't have insurance

## 2021-01-29 ENCOUNTER — Telehealth: Payer: Self-pay

## 2021-01-29 NOTE — Telephone Encounter (Signed)
° °  Post hospital discharge phone call.  Call to patient who had been discharged on January 11,2023.  Spoke directly with the patient.  States that he feels that he is doing fairly well.  Still feels sleep deprived but is trying to catch up.  Is weighing himself daily and weights are 203 and 204.  He feels that he is eating well and staying within his fluid restriction.  Went over his medication and he is taking as per the discharge instruction sheet.  He did discontinue the medications or change the dosage on the Lipitor as he was instructed.  Discussed his follow-up appointment with the outpatient heart failure clinic on January 16 at 2:30 in the afternoon.  He had no further questions.  Tresa Endo RN CHFN

## 2021-01-31 NOTE — Progress Notes (Deleted)
Date:  01/31/2021   ID:  Joshua Cordova, DOB [birth year], MRN [id number]  Patient Location:  [address] [place] [address]   Provider location:   [facility], [place] office  PCP:  Venia Carbon, MD  Cardiologist:  [email or web address]  No chief complaint on file.   History of Present Illness:    Joshua Cordova is a 51 y.o. male  past medical history of Diabetes Obesity Former smoker Asthma Prescription medication reluctance, no insurance, Dilated cardiomyopathy , nonischemic Who presents for f/u of his chronic systolic CHF, EF [id number] 05-13-2018  Last seen in clinic by myself December 2021 Ejection fraction November 01, 2019 estimated less than 20%  cardiac catheterization November 01, 2019 Nonobstructive disease of the LAD, circumflex -Moderately elevated right heart pressures  He has done well on losartan, metoprolol, lasix daily Blood pressure previously low limiting initiation of Entresto Blood pressure continues to run low, [id number] systolic today Occasionally takes extra Lasix reports weight in fluid status has been relatively stable  Working at [employer], should get insurance soon through [place] like to do lab work when [employer] in  Last lab work October 2021, creatinine 1.55 BUN 28 A1c in the 6.8 range May 12, 2016 A1c 12.24 November 2016 down to 8.8, October 12, 2017 6.9  Father died 05/13/2015 He took care of his father for several years   EKG personally reviewed by myself on todays visit NSR rate 85, nonspecific T wave abnormality   Past Medical History:  Diagnosis Date   Chronic systolic heart failure ([place])    Hypertension    NICM (nonischemic cardiomyopathy) ([place])    Past Surgical History:  Procedure Laterality Date   CARDIAC CATHETERIZATION     RIGHT/LEFT HEART CATH AND CORONARY ANGIOGRAPHY N/A 11/01/2019   Procedure: RIGHT/LEFT HEART CATH AND CORONARY ANGIOGRAPHY;  Surgeon: Minna Merritts, MD;  Location: [facility]  CV LAB;  Service: Cardiovascular;  Laterality: N/A;     Current Outpatient Medications on File Prior to Visit  Medication Sig Dispense Refill   albuterol (VENTOLIN HFA) 108 (90 Base) MCG/ACT inhaler TAKE 2 PUFFS BY MOUTH EVERY 6 HOURS AS NEEDED FOR WHEEZE OR SHORTNESS OF BREATH 18 each 0   apixaban (ELIQUIS) 5 MG TABS tablet Take 2 tablets (10 mg total) by mouth 2 (two) times daily for 1 day. Then take 1 tablet (5 mg total) by mouth 2 (two) times daily. 64 tablet 0   atorvastatin (LIPITOR) 80 MG tablet Take 1 tablet (80 mg total) by mouth once daily. 30 tablet 0   dapagliflozin propanediol (FARXIGA) 10 MG TABS tablet Take 1 tablet (10 mg total) by mouth once daily. 30 tablet 0   losartan (COZAAR) 25 MG tablet Take (1/2) tablet (12.5 mg total) by mouth once daily. 15 tablet 0   metoprolol succinate (TOPROL-XL) 25 MG 24 hr tablet Take (1/2) tablet (12.5 mg total) by mouth once daily. 15 tablet 0   torsemide (DEMADEX) 20 MG tablet Take 1 tablet (20 mg total) by mouth once daily. 30 tablet 0   No current facility-administered medications on file prior to visit.    Allergies:   Patient has no known allergies.   Social History   Tobacco Use   Smoking status: Former    Packs/day: 1.00    Types: Cigarettes    Quit date: 12-May-2016    Years since quitting: 5.0   Smokeless tobacco: Never  Vaping Use   Vaping Use: Never  used  Substance Use Topics   Alcohol use: No   Drug use: Never      Family Hx: The patient's family history includes Diabetes in his maternal grandfather and mother; Heart disease in his father and another family member; Hypertension in his father.  ROS:   Please see the history of present illness.    Review of Systems  Constitutional: Negative.   HENT: Negative.    Respiratory: Negative.    Cardiovascular: Negative.   Gastrointestinal: Negative.   Musculoskeletal: Negative.   Neurological: Negative.   Psychiatric/Behavioral: Negative.    All other systems reviewed and  are negative.   Labs/Other Tests and Data Reviewed:    Recent Labs: 01/17/2021: ALT 20; B Natriuretic Peptide 761.9 01/20/2021: Magnesium 2.2 01/25/2021: Hemoglobin 13.9; Platelets 282 01/27/2021: BUN 45; Creatinine, Ser 1.47; Potassium 4.3; Sodium 133   Recent Lipid Panel Lab Results  Component Value Date/Time   CHOL 140 01/18/2021 06:33 AM   CHOL 199 10/22/2020 12:29 PM   TRIG 72 01/18/2021 06:33 AM   HDL 23 (L) 01/18/2021 06:33 AM   HDL 25 (L) 10/22/2020 12:29 PM   CHOLHDL 6.1 01/18/2021 06:33 AM   LDLCALC 103 (H) 01/18/2021 06:33 AM   LDLCALC 156 (H) 10/22/2020 12:29 PM   LDLDIRECT 162.0 09/06/2016 12:34 PM    Wt Readings from Last 3 Encounters:  01/27/21 204 lb 1.6 oz (92.6 kg)  12/15/20 211 lb (95.7 kg)  10/22/20 214 lb (97.1 kg)     Exam:   There were no vitals taken for this visit. Constitutional:  oriented to person, place, and time. No distress.  HENT:  Head: Grossly normal Eyes:  no discharge. No scleral icterus.  Neck: No JVD, no carotid bruits  Cardiovascular: Regular rate and rhythm, no murmurs appreciated Pulmonary/Chest: Clear to auscultation bilaterally, no wheezes or rails Abdominal: Soft.  no distension.  no tenderness.  Musculoskeletal: Normal range of motion Neurological:  normal muscle tone. Coordination normal. No atrophy Skin: Skin warm and dry Psychiatric: normal affect, pleasant   ASSESSMENT & PLAN:    Chronic systolic CHF Continue metoprolol, losartan, Lasix at current dose Reports he will be signing up for insurance in the very near future Would look to add digoxin, Jardiance Potentially could try Entresto though blood pressure is low Will hold off on spironolactone In general reports he is asymptomatic, occasionally takes extra Lasix if he has any shortness of breath when supine  Dilated cardiomyopathy Nonischemic cardiomyopathy Ejection fraction 25% on echocardiogram 2020 ejection fraction 2021 with EF less than 20% Nonischemic by  catheterization, low EF out of proportion to underlying disease -Consider medication changes as above 1C get insurance  Essential hypertension Continue metoprolol succinate 25 daily, losartan 25 daily Low pressures, asymptomatic  Diabetes mellitus type 2, diet-controlled (HCC) Dramatic improvement in numbers   Hyperlipidemia He is concerned about taking a statin, recommend he continue on Lipitor 40 given moderate coronary disease on prior catheterization   Total encounter time more than 25 minutes  Greater than 50% was spent in counseling and coordination of care with the patient    Signed, Ida Rogue, MD  01/31/2021 3:46 PM    [facility] [address] [place] [address], [place], [place] [address]

## 2021-02-01 ENCOUNTER — Ambulatory Visit: Payer: Self-pay | Admitting: Family

## 2021-02-01 ENCOUNTER — Ambulatory Visit: Payer: Self-pay | Admitting: Cardiovascular Disease

## 2021-02-01 DIAGNOSIS — N182 Chronic kidney disease, stage 2 (mild): Secondary | ICD-10-CM

## 2021-02-01 DIAGNOSIS — E119 Type 2 diabetes mellitus without complications: Secondary | ICD-10-CM

## 2021-02-01 DIAGNOSIS — I5022 Chronic systolic (congestive) heart failure: Secondary | ICD-10-CM

## 2021-02-01 DIAGNOSIS — I10 Essential (primary) hypertension: Secondary | ICD-10-CM

## 2021-02-01 DIAGNOSIS — R0602 Shortness of breath: Secondary | ICD-10-CM

## 2021-02-01 DIAGNOSIS — I420 Dilated cardiomyopathy: Secondary | ICD-10-CM

## 2021-02-01 DIAGNOSIS — I2609 Other pulmonary embolism with acute cor pulmonale: Secondary | ICD-10-CM

## 2021-02-01 NOTE — Telephone Encounter (Signed)
Patient cancelled appt today at this time due to current [facility] Admission .

## 2021-02-02 ENCOUNTER — Ambulatory Visit: Payer: Self-pay | Admitting: Cardiovascular Disease

## 2021-02-08 ENCOUNTER — Ambulatory Visit: Payer: Self-pay | Admitting: Cardiovascular Disease

## 2021-02-16 ENCOUNTER — Ambulatory Visit: Payer: Self-pay | Admitting: Internal Medicine

## 2021-02-22 ENCOUNTER — Other Ambulatory Visit: Payer: Self-pay | Admitting: Internal Medicine

## 2021-03-31 ENCOUNTER — Other Ambulatory Visit: Payer: Self-pay

## 2021-04-27 ENCOUNTER — Telehealth: Payer: Self-pay | Admitting: Pharmacist

## 2021-04-27 NOTE — Telephone Encounter (Signed)
Patient failed to provide requested 2023 financial documentation. No additional medication assistance will be provided by [facility] without the required proof of income documentation. Patient notified by letter. ?Debra Cheek ?Administrative Assistant ?Medication Management Clinic ? ?

## 2021-04-28 ENCOUNTER — Other Ambulatory Visit: Payer: Self-pay

## 2021-06-22 ENCOUNTER — Other Ambulatory Visit: Payer: Self-pay | Admitting: Family

## 2021-06-22 ENCOUNTER — Telehealth: Payer: Self-pay | Admitting: Cardiovascular Disease

## 2021-06-22 ENCOUNTER — Telehealth: Payer: Self-pay | Admitting: Internal Medicine

## 2021-06-22 DIAGNOSIS — R21 Rash and other nonspecific skin eruption: Secondary | ICD-10-CM

## 2021-06-22 NOTE — Telephone Encounter (Signed)
Pt called and asked if Dr Alphonsus Sias could put in a referral for dermatology, he said he has discussed with Dr Alphonsus Sias before. Breakout on face. He has seen [facility] dermatology in [place] before but just needs referral now because he now has insurance. Please advise

## 2021-06-22 NOTE — Telephone Encounter (Signed)
3 attempts to schedule fu appt from recall list.   Deleting recall.   

## 2021-06-23 NOTE — Telephone Encounter (Signed)
Kennyth Arnold, FNP to Kris Mouton, CMA      4:24 PM Referral placed    Pt notified that referral was in place and pt voiced understanding and very appreciative.

## 2021-07-08 ENCOUNTER — Ambulatory Visit: Payer: Medicaid Other | Admitting: Nurse Practitioner

## 2021-07-08 ENCOUNTER — Ambulatory Visit (INDEPENDENT_AMBULATORY_CARE_PROVIDER_SITE_OTHER)
Admission: RE | Admit: 2021-07-08 | Discharge: 2021-07-08 | Disposition: A | Discharge status: Home / Self Care | Payer: Medicaid Other | Source: Ambulatory Visit | Attending: Nurse Practitioner | Admitting: Nurse Practitioner

## 2021-07-08 VITALS — BP 94/62 | HR 88 | Temp 98.4°F | Resp 18 | Ht 71.0 in | Wt 208.2 lb

## 2021-07-08 DIAGNOSIS — R051 Acute cough: Secondary | ICD-10-CM

## 2021-07-08 DIAGNOSIS — R7989 Other specified abnormal findings of blood chemistry: Secondary | ICD-10-CM | POA: Insufficient documentation

## 2021-07-08 DIAGNOSIS — Z8679 Personal history of other diseases of the circulatory system: Secondary | ICD-10-CM | POA: Insufficient documentation

## 2021-07-08 DIAGNOSIS — J069 Acute upper respiratory infection, unspecified: Secondary | ICD-10-CM

## 2021-07-08 LAB — BASIC METABOLIC PANEL
BUN: 41 mg/dL — ABNORMAL HIGH (ref 6–23)
CO2: 26 mEq/L (ref 19–32)
Calcium: 9.4 mg/dL (ref 8.4–10.5)
Chloride: 101 mEq/L (ref 96–112)
Creatinine, Ser: 1.77 mg/dL — ABNORMAL HIGH (ref 0.40–1.50)
GFR: 44.53 mL/min — ABNORMAL LOW (ref >60.00)
Glucose, Bld: 93 mg/dL (ref 70–99)
Potassium: 3.9 mEq/L (ref 3.5–5.1)
Sodium: 136 mEq/L (ref 135–145)

## 2021-07-08 LAB — BRAIN NATRIURETIC PEPTIDE: Pro B Natriuretic peptide (BNP): 519 pg/mL — ABNORMAL HIGH (ref 0.0–100.0)

## 2021-07-08 MED ORDER — DOXYCYCLINE HYCLATE 100 MG PO TABS: 100.0000 mg | ORAL_TABLET | Freq: Two times a day (BID) | ORAL | 0 refills | Status: AC

## 2021-07-08 MED ORDER — FLUTICASONE PROPIONATE 50 MCG/ACT NA SUSP: 2.0000 | Freq: Every day | NASAL | 0 refills | Status: DC

## 2021-07-08 NOTE — Assessment & Plan Note (Signed)
Given length of illness and patient's comorbidities would like to treat with doxycycline 100 mg twice daily.  Did offer and suggested we do Augmentin but patient states last time he had pneumonia did Augmentin and had a come back and be placed on doxycycline.  Will obtain chest x-ray doxycycline sent to pharmacy on file also check basic labs as patient does have heart failure and recently elevated BN P this could be contributing to cough pending lab results and x-ray.

## 2021-07-08 NOTE — Patient Instructions (Signed)
Nice to see you today I will be in touch with the labs and xray results once I have them Follow up if you do not start improving over the next 48-72 hours  Plain Mucinex over the counter also called Guaifenesin

## 2021-07-08 NOTE — Assessment & Plan Note (Signed)
Given length of illness and patient comorbidities will elect to treat with doxycycline 100 mg twice daily for 7 days.

## 2021-07-08 NOTE — Progress Notes (Signed)
Acute Office Visit  Subjective:     Patient ID: Joshua Cordova, male    DOB: April 12, 1970, 51 y.o.   MRN: [id number]  Chief Complaint  Patient presents with   Cough    X 2 weeks, coughing up yellowish phlegm, wheezing, SOB, nasal/sinus congestion, drainage. No fever. No sore throat. No covid test done. Has taking Coresidin night time OTC.      Patient is in today for cough  Symptoms started approx 2 weeks ago No covid test No covid vaccine No flu vaccine No sick contacts States that he has been taking coricidin at  Has a history of pneumonia in 12/2020.  This was treated using outpatient treatment modalities.  Patient does have history of heart failure and is followed through [facility] clinic recently seen by them approximately 9 days ago and BNP was over 700.  Patient states he is taking torsemide 40 mg twice daily currently.  States when he holds fluid is normally in his abdomen not lower extremities.  Review of Systems  Constitutional:  Positive for malaise/fatigue. Negative for chills and fever.  HENT:  Positive for congestion and ear pain (popping). Negative for ear discharge, sinus pain and sore throat.        PND "+"  Respiratory:  Positive for cough (yellowish color), shortness of breath (at rest) and wheezing.   Gastrointestinal:  Negative for diarrhea, nausea and vomiting.  Musculoskeletal:  Negative for joint pain and myalgias.  Neurological:  Negative for headaches.        Objective:    BP 94/62   Pulse 88   Temp 98.4 F (36.9 C)   Resp 18   Ht 5\' 11"  (1.803 m)   Wt 208 lb 4 oz (94.5 kg)   SpO2 95%   BMI 29.04 kg/m  BP Readings from Last 3 Encounters:  07/08/21 94/62  01/27/21 113/81  12/15/20 102/80   Wt Readings from Last 3 Encounters:  07/08/21 208 lb 4 oz (94.5 kg)  01/27/21 204 lb 1.6 oz (92.6 kg)  12/15/20 211 lb (95.7 kg)      Physical Exam Vitals and nursing note reviewed.  Constitutional:      Appearance: Normal appearance.  HENT:      Right Ear: Tympanic membrane, ear canal and external ear normal.     Left Ear: Tympanic membrane, ear canal and external ear normal.     Nose:     Right Sinus: No maxillary sinus tenderness or frontal sinus tenderness.     Left Sinus: No maxillary sinus tenderness or frontal sinus tenderness.     Mouth/Throat:     Mouth: Mucous membranes are moist.     Pharynx: Oropharynx is clear.  Cardiovascular:     Rate and Rhythm: Normal rate and regular rhythm.     Heart sounds: Normal heart sounds.  Pulmonary:     Effort: Pulmonary effort is normal.     Breath sounds: Normal breath sounds.  Abdominal:     General: Bowel sounds are normal. There is no distension.     Palpations: There is no mass.     Tenderness: There is no abdominal tenderness.     Hernia: No hernia is present.  Musculoskeletal:     Right lower leg: No edema.     Left lower leg: No edema.  Neurological:     Mental Status: He is alert.     No results found for any visits on 07/08/21.      Assessment & Plan:  Problem List Items Addressed This Visit       Respiratory   Upper respiratory tract infection - Primary    Given length of illness and patient comorbidities will elect to treat with doxycycline 100 mg twice daily for 7 days.      Relevant Medications   doxycycline (VIBRA-TABS) 100 MG tablet     Other   Cough    Given length of illness and patient's comorbidities would like to treat with doxycycline 100 mg twice daily.  Did offer and suggested we do Augmentin but patient states last time he had pneumonia did Augmentin and had a come back and be placed on doxycycline.  Will obtain chest x-ray doxycycline sent to pharmacy on file also check basic labs as patient does have heart failure and recently elevated BN P this could be contributing to cough pending lab results and x-ray.      Relevant Medications   fluticasone (FLONASE) 50 MCG/ACT nasal spray   Other Relevant Orders   Brain natriuretic peptide   DG  Chest 2 View (Completed)   History of heart failure    Given patient's history of heart failure and recent labs that were elevated we will recheck basic labs today inclusive of BNP patient currently taking torsemide 40 mg twice daily per patient report followed by [facility] cardiology.  No red flags in office today patient seems to be comfortable      Relevant Orders   Brain natriuretic peptide   Basic metabolic panel   DG Chest 2 View (Completed)   Elevated brain natriuretic peptide (BNP) level   Relevant Orders   Brain natriuretic peptide    Meds ordered this encounter  Medications   doxycycline (VIBRA-TABS) 100 MG tablet    Sig: Take 1 tablet (100 mg total) by mouth 2 (two) times daily for 7 days.    Dispense:  14 tablet    Refill:  0    Order Specific Question:   Supervising Provider    Answer:   Milinda Antis MARNE A [[id number]]   fluticasone (FLONASE) 50 MCG/ACT nasal spray    Sig: Place 2 sprays into both nostrils daily.    Dispense:  16 g    Refill:  0    Order Specific Question:   Supervising Provider    Answer:   TOWER, MARNE A [[id number]]    Return if symptoms worsen or fail to improve.  Audria Nine, NP

## 2021-07-08 NOTE — Assessment & Plan Note (Signed)
Given patient's history of heart failure and recent labs that were elevated we will recheck basic labs today inclusive of BNP patient currently taking torsemide 40 mg twice daily per patient report followed by [facility] cardiology.  No red flags in office today patient seems to be comfortable

## 2021-07-09 ENCOUNTER — Other Ambulatory Visit: Payer: Self-pay | Admitting: Nurse Practitioner

## 2021-07-09 ENCOUNTER — Telehealth: Payer: Self-pay | Admitting: Nurse Practitioner

## 2021-07-09 DIAGNOSIS — R911 Solitary pulmonary nodule: Secondary | ICD-10-CM

## 2021-07-12 ENCOUNTER — Telehealth: Payer: Self-pay

## 2021-07-12 NOTE — Telephone Encounter (Signed)
Received a call from [facility] Cardiology they asked if Ashtyn could fax the lab results instead of verbal, fax is [phone]

## 2021-07-13 ENCOUNTER — Telehealth: Payer: Self-pay

## 2021-07-13 NOTE — Telephone Encounter (Signed)
Patient called to follow up on his symptoms. Patient was seen on 07/08/21 by [facility] and was given Doxy and Flonase for his symptoms. Patient finished antibiotic, using Flonase-but said it made the sinus congestion/head stopped up feeling worse, has been taking Claritin the last 2 days but the head congestion is not improving at all. His chest congestion and resolved. Patient has been using vix rub under his nose. No fever. He asked about using Mucinex decongestant but per [facility] patient not to use decongestants. Asked patient if he wanted to try oral steroid course since he has tried OTC regimen but patient did not want to take that. Advised patient I would ask what else he can try OTC.

## 2021-07-13 NOTE — Telephone Encounter (Signed)
Belinda called in stating that because there provider did not order the labs and due to patients BMP being lower than normal there will be no changes at this time.

## 2021-07-13 NOTE — Telephone Encounter (Signed)
The other option is saline spray and maybe a sinus rinse like a netti pot

## 2021-08-03 ENCOUNTER — Ambulatory Visit: Payer: Medicaid Other | Admitting: Internal Medicine

## 2021-11-09 ENCOUNTER — Telehealth: Payer: Self-pay | Admitting: Internal Medicine

## 2021-11-09 DIAGNOSIS — R051 Acute cough: Secondary | ICD-10-CM

## 2021-11-09 MED ORDER — FLUTICASONE PROPIONATE 50 MCG/ACT NA SUSP: 2.0000 | Freq: Every day | NASAL | 5 refills | Status: AC

## 2021-11-09 MED ORDER — ALBUTEROL SULFATE HFA 108 (90 BASE) MCG/ACT IN AERS: INHALATION_SPRAY | RESPIRATORY_TRACT | 0 refills | Status: AC

## 2021-11-09 NOTE — Telephone Encounter (Signed)
Patient called in again stating that pharmacy hasn't received rx for Flonase .

## 2021-11-09 NOTE — Addendum Note (Signed)
Addended by: Pilar Grammes on: 11/09/2021 02:47 PM   Modules accepted: Orders

## 2021-11-09 NOTE — Telephone Encounter (Signed)
  Encourage patient to contact the pharmacy for refills or they can request refills through [facility]  Did the patient contact the pharmacy:  no   LAST APPOINTMENT DATE:  Please schedule appointment if longer than 1 year 07/08/2021  NEXT APPOINTMENT DATE:  MEDICATION:albuterol (VENTOLIN HFA) 108 (90 Base) MCG/ACT inhaler,  fluticasone (FLONASE) 50 MCG/ACT nasal spray Is the patient out of medication? yes  If not, how much is left?  Is this a 90 day supply: no  PHARMACY: [id number] - [address], [place] Phone:  [phone]  Fax:  [phone]      Let patient know to contact pharmacy at the end of the day to make sure medication is ready.  Please notify patient to allow 48-72 hours to process

## 2021-11-09 NOTE — Telephone Encounter (Signed)
Patient stated that the pharmacy didn't get the Flonase.

## 2021-11-09 NOTE — Telephone Encounter (Signed)
Sorry. Flonase sent to pharmacy. It is available OTC. Medicaid may not cover it as it is available OTC.l

## 2021-11-16 ENCOUNTER — Other Ambulatory Visit: Payer: Self-pay | Admitting: Internal Medicine

## 2021-11-16 MED ORDER — ENTRESTO 24-26 MG PO TABS: 1.0000 | ORAL_TABLET | Freq: Two times a day (BID) | ORAL | 1 refills | Status: AC

## 2021-11-16 NOTE — Progress Notes (Signed)
Got more info from [facility] and was able to reach him on the phone. He feels :great" and is able to walk around well, etc On IV milrinone now Running out of entresto and can't get through at [facility]---refill sent He is not excited about the LVAD but I urged him to get the defibrillator when he can (he is sporadic with the [place])

## 2021-12-21 ENCOUNTER — Ambulatory Visit: Payer: Medicaid Other | Admitting: Internal Medicine

## 2021-12-21 ENCOUNTER — Encounter: Payer: Self-pay | Admitting: Internal Medicine

## 2021-12-21 VITALS — BP 122/64 | HR 101 | Temp 97.1°F | Ht 71.0 in | Wt 193.0 lb

## 2021-12-21 DIAGNOSIS — R21 Rash and other nonspecific skin eruption: Secondary | ICD-10-CM | POA: Diagnosis not present

## 2021-12-21 MED ORDER — PREDNISONE 20 MG PO TABS: 40.0000 mg | ORAL_TABLET | Freq: Every day | ORAL | 0 refills | Status: AC

## 2021-12-21 NOTE — Assessment & Plan Note (Signed)
Clearly doesn't seem to be acne Doesn't look infectious Might be drug reaction but not clear if milrinone does this Will try brief course of prednisone May need to see a different dermatologist for second opinion

## 2021-12-21 NOTE — Progress Notes (Signed)
Subjective:    Patient ID: Joshua Cordova, male    DOB: [birth year], 51 y.o.   MRN: [id number]  HPI Here due to rash  Has had a lot of testing at [facility] due to non ischemic cardiomyopathy Now on IV milrinone EF has improved  Has rash across shoulders and head Thought it was an allergy Went 3 times to derm---Dr Cheree D[place] She diagnosed acne---gave 2 different creams but no improvement  No itching Gets irritated by shirts Goes back 3 months  Current Outpatient Medications on File Prior to Visit  Medication Sig Dispense Refill   albuterol (VENTOLIN HFA) 108 (90 Base) MCG/ACT inhaler TAKE 2 PUFFS BY MOUTH EVERY 6 HOURS AS NEEDED FOR WHEEZE OR SHORTNESS OF BREATH 18 each 0   clindamycin (CLEOCIN T) 1 % lotion Apply topically.     eplerenone (INSPRA) 25 MG tablet Take 1 tablet by mouth daily.     fluticasone (FLONASE) 50 MCG/ACT nasal spray Place 2 sprays into both nostrils daily. 16 g 5   sacubitril-valsartan (ENTRESTO) 24-26 MG Take 1 tablet by mouth in the morning and at bedtime. 60 tablet 1   torsemide (DEMADEX) 20 MG tablet Take 1 tablet (20 mg total) by mouth once daily. 30 tablet 0   No current facility-administered medications on file prior to visit.    No Known Allergies  Past Medical History:  Diagnosis Date   Chronic systolic heart failure (HCC)    Hypertension    NICM (nonischemic cardiomyopathy) (HCC)     Past Surgical History:  Procedure Laterality Date   CARDIAC CATHETERIZATION     RIGHT/LEFT HEART CATH AND CORONARY ANGIOGRAPHY N/A 11/01/2019   Procedure: RIGHT/LEFT HEART CATH AND CORONARY ANGIOGRAPHY;  Surgeon: [name], MD;  Location: [facility] INVASIVE CV LAB;  Service: Cardiovascular;  Laterality: N/A;    Family History  Problem Relation Age of Onset   Diabetes Mother    Hypertension Father    Heart disease Father    Diabetes Maternal Grandfather    Heart disease Other     Social History   Socioeconomic History   Marital status: Single     Spouse name: Not on file   Number of children: Not on file   Years of education: Not on file   Highest education level: Not on file  Occupational History   Occupation: Delivering auto parts    Comment: [employer]   Occupation:    Tobacco Use   Smoking status: Former    Packs/day: 1.00    Types: Cigarettes    Quit date: 2018    Years since quitting: 5.9   Smokeless tobacco: Never  Vaping Use   Vaping Use: Never used  Substance and Sexual Activity   Alcohol use: No   Drug use: Never   Sexual activity: Not on file  Other Topics Concern   Not on file  Social History Narrative   Not on file   Social Determinants of Health   Financial Resource Strain: Not on file  Food Insecurity: Not on file  Transportation Needs: Not on file  Physical Activity: Not on file  Stress: Not on file  Social Connections: Not on file  Intimate Partner Violence: Not on file    Review of Systems Weight is down now Breathing is okay    Objective:   Physical Exam Constitutional:      Appearance: Normal appearance.  Skin:    Comments: Widespread small papules on entire trunk. Some scaling Face mostly  spared  Neurological:     Mental Status: He is alert.            Assessment & Plan:

## 2022-01-06 ENCOUNTER — Telehealth: Payer: Self-pay

## 2022-01-06 NOTE — Telephone Encounter (Signed)
Attempted to call pt, no answer, left voicemail to call back.    [facility] Night - Client TELEPHONE ADVICE RECORD AccessNurse Patient Name: [facility] [place] Gender: Male DOB: Nov 19, 1970 Age: 51 Y 23 D Return Phone Number: [phone] (Primary) Address: City/ State/ Zip: [place]  [address] Client [facility] Night - Client Client Site [facility] - Night Provider Tillman Abide- MD Contact Type Call Who Is Calling Patient / Member / Family / Caregiver Call Type Triage / Clinical Relationship To Patient Self Return Phone Number [phone] (Primary) Chief Complaint Cough Reason for Call Symptomatic / Request for Health Information Initial Comment Caller states he wants an appointment. He has fluid in his chest and a cough. He wanted to speak to a nurse. Translation No Nurse Assessment Nurse: Lane Hacker, RN, Elvin So Date/Time (Eastern Time): 01/06/2022 9:42:34 AM Confirm and document reason for call. If symptomatic, describe symptoms. ---Caller states that he has been coughing when laying down, and worried about fluid on chest, h/o CHF. S/ S worse yesterday am. He hasn't been able to sleep in about a week. - He has an appt for next Wednesday. Does the patient have any new or worsening symptoms? ---Yes Will a triage be completed? ---Yes Related visit to physician within the last 2 weeks? ---No Does the PT have any chronic conditions? (i.e. diabetes, asthma, this includes High risk factors for pregnancy, etc.) ---Yes List chronic conditions. ---CHF Is this a behavioral health or substance abuse call? ---No Guidelines Guideline Title Affirmed Question Affirmed Notes Nurse Date/Time (Eastern Time) Heart Failure PostHospitalization Follow-up Call [1] Thigh, calf, or ankle swelling AND [2] bilateral AND [3] 1 side is more swollen Lane Hacker, RN, [facility] 01/06/2022 9:44:41 AM Disp. Time Lamount Cohen Time)  Disposition Final User 01/05/2022 9:30:56 PM Send to Clinical Angelica Pou, RN, Alveda Reasons NOTE: All timestamps contained within this report are represented as [place] Standard Time. CONFIDENTIALTY NOTICE: This fax transmission is intended only for the addressee. It contains information that is legally privileged, confidential or otherwise protected from use or disclosure. If you are not the intended recipient, you are strictly prohibited from reviewing, disclosing, copying using or disseminating any of this information or taking any action in reliance on or regarding this information. If you have received this fax in error, please notify us immediately by telephone so that we can arrange for its return to [place]. Phone: [phone], Toll-Free: [phone], Fax: [phone] Page: 2 of 2 Call Id: [id number] Disp. Time Lamount Cohen Time) Disposition Final User 01/05/2022 9:39:10 PM Attempt made - message left Arther Dames, RN, Benetta Spar 01/05/2022 10:06:04 PM FINAL ATTEMPT MADE - message left Arther Dames RN, Benetta Spar 01/05/2022 10:06:09 PM Send to RN Final Attempt Rocky Morel, RN, Benetta Spar 01/06/2022 9:48:22 AM See HCP within 4 Hours (or PCP triage) Yes Lane Hacker, RN, Elvin So 01/06/2022 9:55:29 AM Called On-Call Provider E. Lopez, RN, Elvin So Reason: Tried back line # no answer; calling main line to let them know pt not on phone any longer but needs appt today within 4 hrs. needs to be worked in if possible. He can not wait til next wk. - spoke to [place] at office, nothing available today or tomorrow. Office will call him back. Final Disposition 01/06/2022 9:48:22 AM See HCP within 4 Hours (or PCP triage) Yes Lane Hacker, RN, Elvin So Caller Disagree/Comply Comply Caller Understands Yes PreDisposition Go to Urgent Care/Walk-In Clinic Care Advice Given Per Guideline SEE HCP (OR PCP TRIAGE) WITHIN 4 HOURS: * IF OFFICE WILL BE OPEN:  You need to be seen within the next 3 or 4 hours. Call your doctor (or NP/PA)  now or as soon as the office opens. CALL BACK IF: * You become worse CARE ADVICE given per Heart Failure Post-Hospitalization Follow-Up Call (Adult) guideline. Comments User: Rubie Maid, RN Date/Time Lamount Cohen Time): 01/06/2022 9:46:01 AM Feet are swollen, left foot is worse. Swelling only in feet so far. Referrals GO TO FACILITY UNDECIDED REFERRED TO PCP OFFICE

## 2022-01-06 NOTE — Telephone Encounter (Signed)
Attempted to call pt, no answer, left voicemail to call back.

## 2022-01-07 NOTE — Telephone Encounter (Signed)
Unable to reach pt by phone, left v/m requesting pt to cb. I spoke with Miranda pts mom to see if pt had another contact # and she said no just the [phone]. Per DPR no info was given to pts mom.sending note to Dr Alphonsus Sias who is out of office, Worthy Rancher FNP and triage.

## 2022-01-07 NOTE — Telephone Encounter (Signed)
I was able to reach pt by phone and he said he saw his cardiologist and everything is fine; nothing further needed. UC & ED precautions given if occurs in future and pt voiced understanding. Sending note to [employer] as Lorain Childes.

## 2022-01-12 ENCOUNTER — Ambulatory Visit: Payer: Medicaid Other | Admitting: Internal Medicine

## 2022-08-18 ENCOUNTER — Telehealth: Payer: Self-pay

## 2022-08-18 NOTE — Transitions of Care (Post Inpatient/ED Visit) (Signed)
   08/18/2022  Name: Drace Gater MRN: [id number] DOB: 10-10-1970  Today's TOC FU Call Status: Today's TOC FU Call Status:: Unsuccessful Call (2nd Attempt) Unsuccessful Call (1st Attempt) Date: 08/18/22 Unsuccessful Call (2nd Attempt) Date: 08/18/22  Attempted to reach the patient regarding the most recent Inpatient/ED visit.  Follow Up Plan: Additional outreach attempts will be made to reach the patient to complete the Transitions of Care (Post Inpatient/ED visit) call.   Signature   Woodfin Ganja LPN [facility] Nurse Health Advisor Direct Dial [phone]

## 2022-08-18 NOTE — Transitions of Care (Post Inpatient/ED Visit) (Signed)
   08/18/2022  Name: Joshua Cordova MRN: [id number] DOB: Jul 05, 1970  Today's TOC FU Call Status: Today's TOC FU Call Status:: Unsuccessful Call (1st Attempt) Unsuccessful Call (1st Attempt) Date: 08/18/22  Attempted to reach the patient regarding the most recent Inpatient/ED visit.  Follow Up Plan: Additional outreach attempts will be made to reach the patient to complete the Transitions of Care (Post Inpatient/ED visit) call.   Signature   Woodfin Ganja LPN [facility] Nurse Health Advisor Direct Dial [phone]

## 2022-08-24 ENCOUNTER — Other Ambulatory Visit
Admission: RE | Admit: 2022-08-24 | Discharge: 2022-08-24 | Disposition: A | Discharge status: Home / Self Care | Payer: Medicaid Other | Source: Ambulatory Visit | Attending: Nephrology | Admitting: Nephrology

## 2022-08-24 DIAGNOSIS — Z792 Long term (current) use of antibiotics: Secondary | ICD-10-CM | POA: Insufficient documentation

## 2022-08-24 DIAGNOSIS — N189 Chronic kidney disease, unspecified: Secondary | ICD-10-CM | POA: Insufficient documentation

## 2022-08-24 DIAGNOSIS — I509 Heart failure, unspecified: Secondary | ICD-10-CM | POA: Insufficient documentation

## 2022-08-24 LAB — CBC WITH DIFFERENTIAL/PLATELET
Abs Immature Granulocytes: 0.12 K/uL — ABNORMAL HIGH (ref 0.00–0.07)
Basophils Absolute: 0.1 K/uL (ref 0.0–0.1)
Basophils Relative: 2 %
Eosinophils Absolute: 0.3 K/uL (ref 0.0–0.5)
Eosinophils Relative: 4 %
HCT: 33.8 % — ABNORMAL LOW (ref 39.0–52.0)
Hemoglobin: 9.8 g/dL — ABNORMAL LOW (ref 13.0–17.0)
Immature Granulocytes: 1 %
Lymphocytes Relative: 8 %
Lymphs Abs: 0.7 K/uL (ref 0.7–4.0)
MCH: 28 pg (ref 26.0–34.0)
MCHC: 29 g/dL — ABNORMAL LOW (ref 30.0–36.0)
MCV: 96.6 fL (ref 80.0–100.0)
Monocytes Absolute: 0.8 K/uL (ref 0.1–1.0)
Monocytes Relative: 9 %
Neutro Abs: 6.8 K/uL (ref 1.7–7.7)
Neutrophils Relative %: 76 %
Platelets: 210 K/uL (ref 150–400)
RBC: 3.5 MIL/uL — ABNORMAL LOW (ref 4.22–5.81)
RDW: 22.5 % — ABNORMAL HIGH (ref 11.5–15.5)
WBC: 8.8 K/uL (ref 4.0–10.5)
nRBC: 0 % (ref 0.0–0.2)

## 2022-08-24 LAB — PROTIME-INR
INR: 1.3 — ABNORMAL HIGH (ref 0.8–1.2)
Prothrombin Time: 16.6 s — ABNORMAL HIGH (ref 11.4–15.2)

## 2022-08-24 LAB — ELECTROLYTE PANEL
Anion gap: 13 (ref 5–15)
CO2: 23 mmol/L (ref 22–32)
Chloride: 100 mmol/L (ref 98–111)
Potassium: 4.6 mmol/L (ref 3.5–5.1)
Sodium: 136 mmol/L (ref 135–145)

## 2022-08-24 LAB — VANCOMYCIN, TROUGH: Vancomycin Tr: 13 ug/mL — ABNORMAL LOW (ref 15–20)

## 2022-09-06 ENCOUNTER — Ambulatory Visit: Payer: Medicaid Other | Admitting: Physician Assistant

## 2022-09-08 ENCOUNTER — Ambulatory Visit: Payer: Medicaid Other | Admitting: Physician Assistant

## 2023-01-12 IMAGING — US US ABDOMEN LIMITED
1 series · 14 of 25 positions shown · non-contrast
Comparison: CT angio chest from earlier today

CLINICAL DATA: Right upper quadrant pain

EXAM:
ULTRASOUND ABDOMEN LIMITED RIGHT UPPER QUADRANT

[Series 1: us abdomen limited ruq (liver/gb) · 14 of 46 slices shown]
[im 1/46]
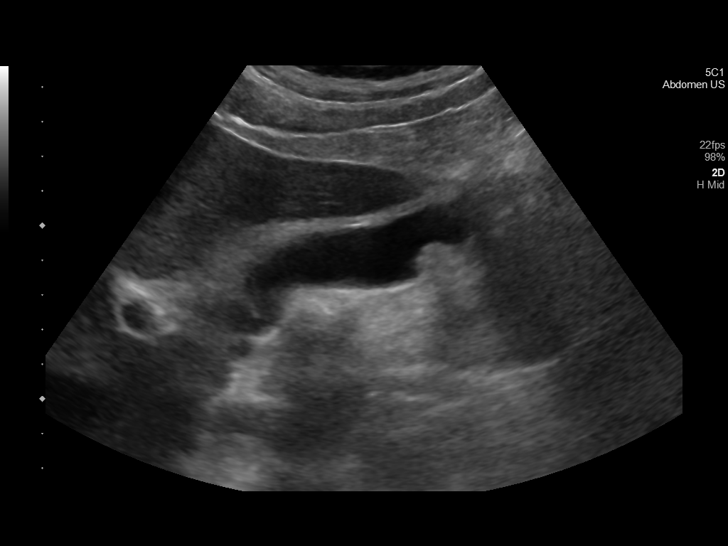
[im 4/46]
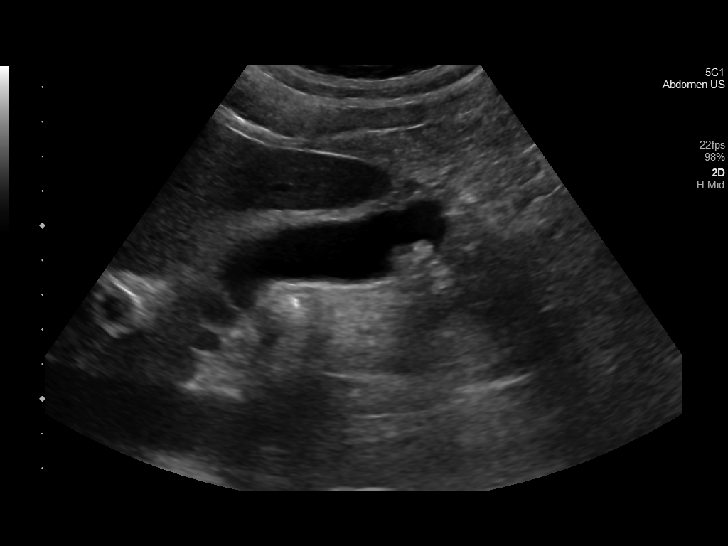
[im 8/46]
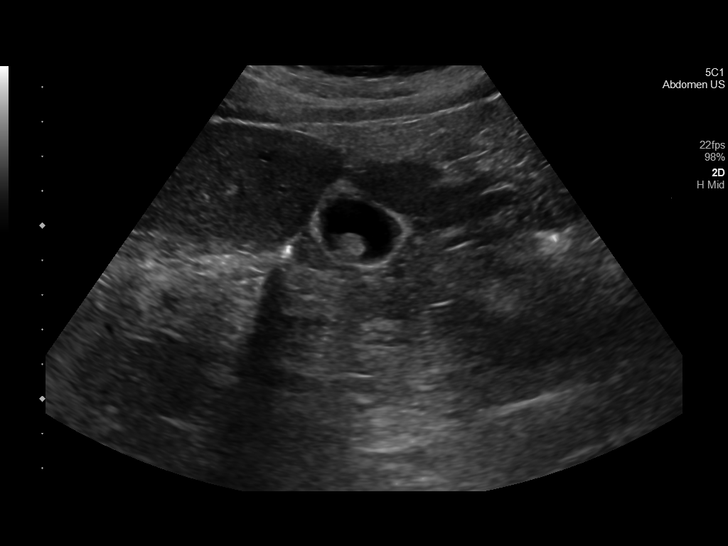
[im 12/46]
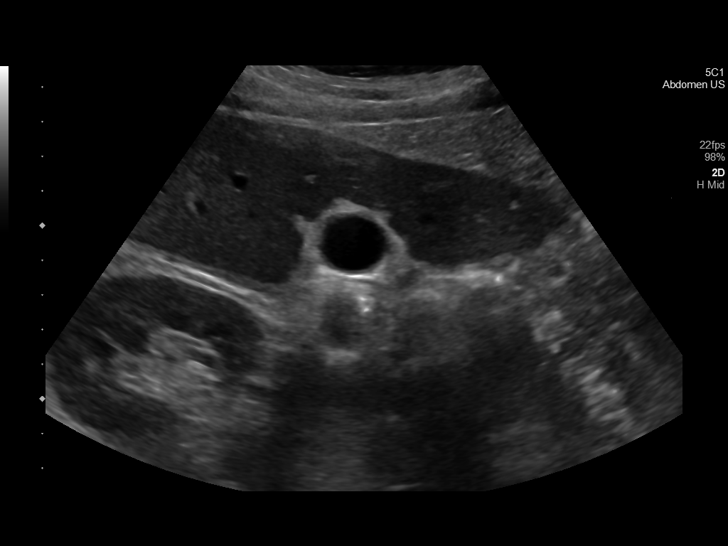
[im 16/46]
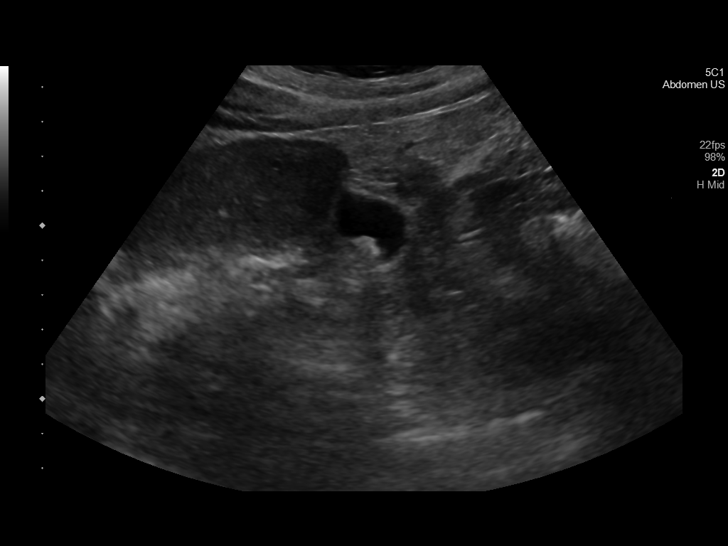
[im 17/46]
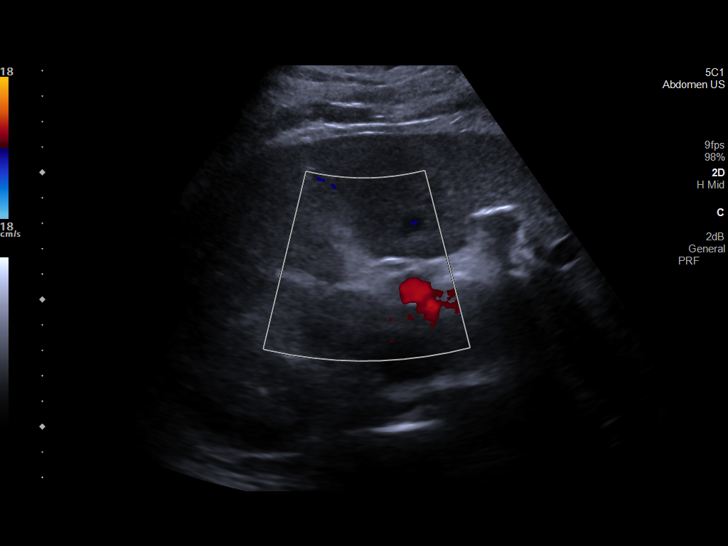
[im 21/46]
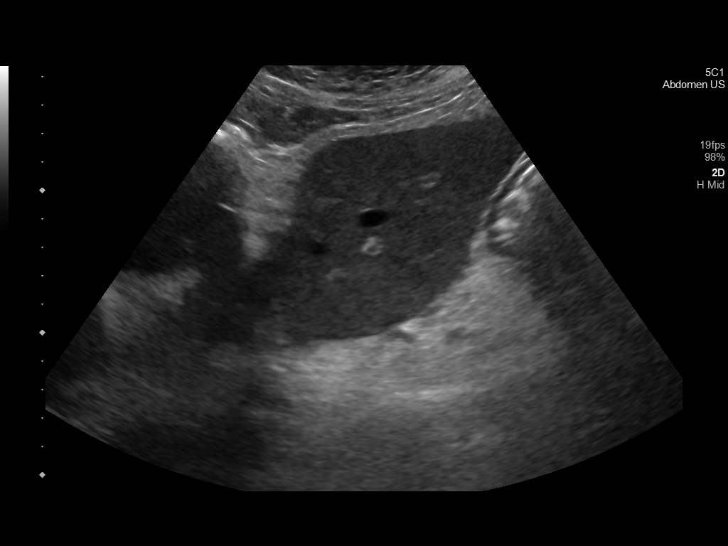
[im 25/46]
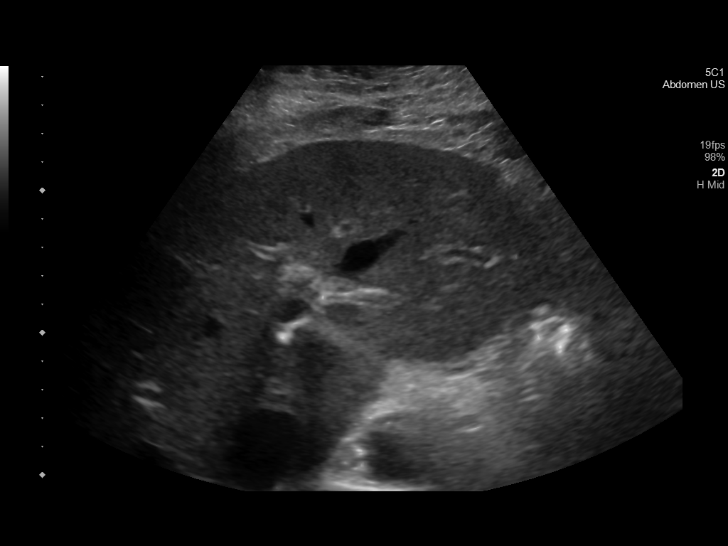
[im 29/46]
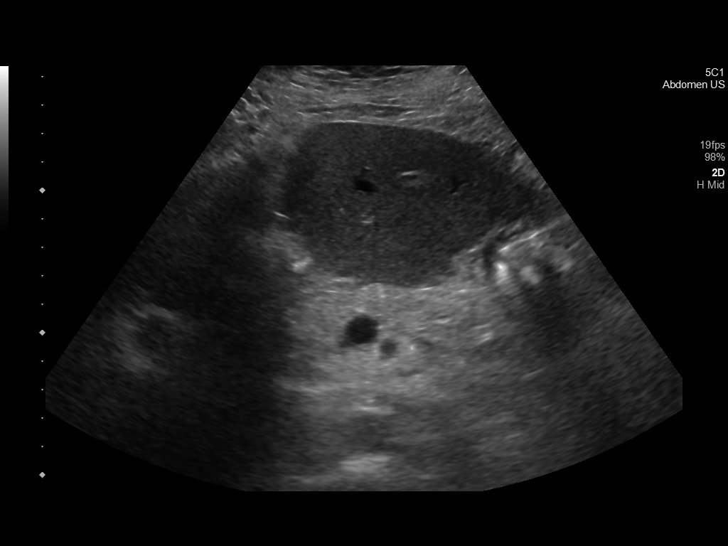
[im 31/46]
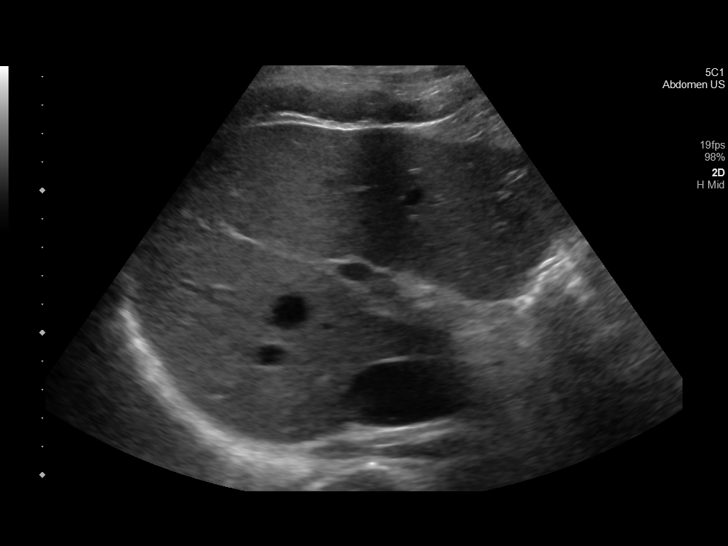
[im 34/46]
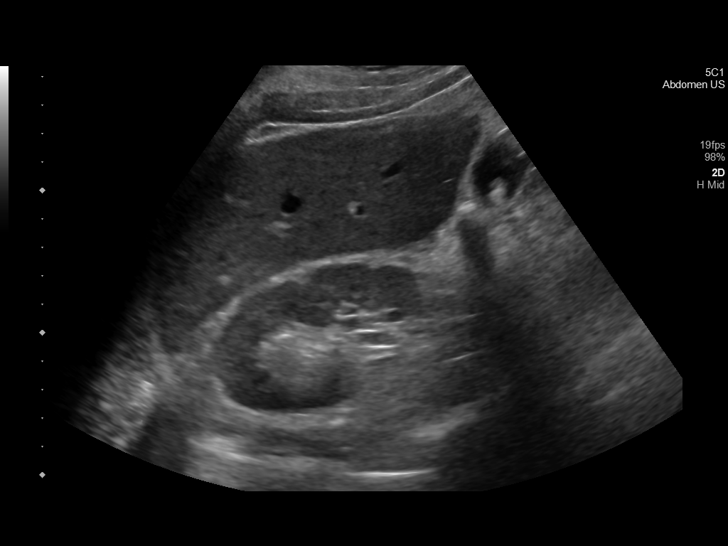
[im 38/46]
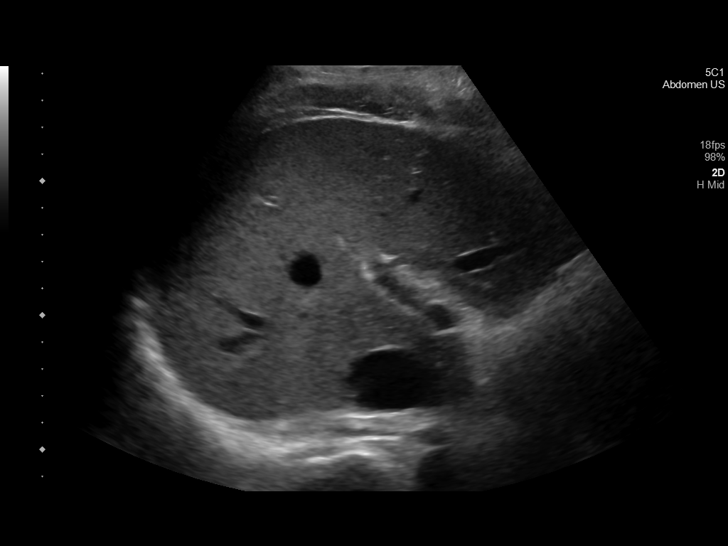
[im 42/46]
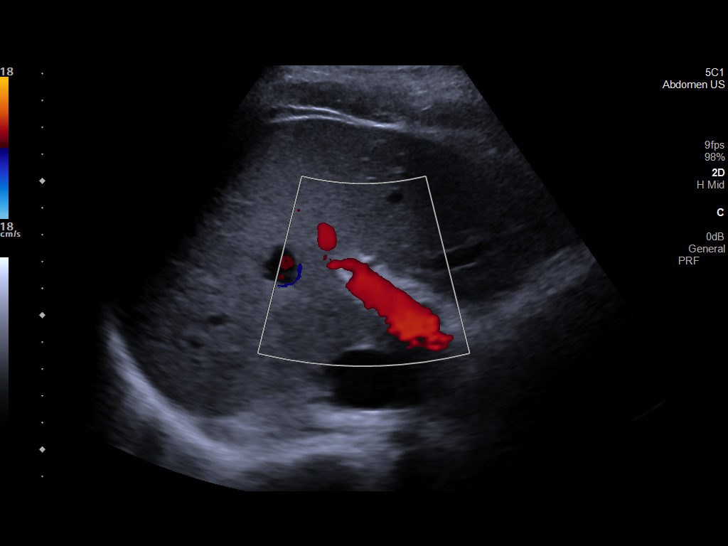
[im 46/46]
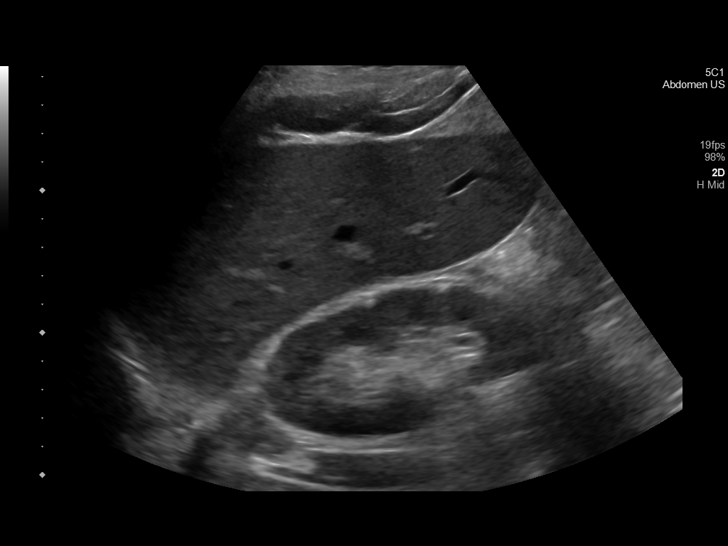

[14 of 25 positions shown; findings below may reference images not displayed]

FINDINGS: Gallbladder:

Gallbladder wall thickening is identified measuring up to 4 mm.
Gallstones are noted. These measure up to 1.2 cm. No pericholecystic
fluid or sonographic Murphy's sign.

Common bile duct:

Diameter: 4.5 mm

Liver:

No focal lesion identified. Within normal limits in parenchymal
echogenicity. Portal vein is patent on color Doppler imaging with
normal direction of blood flow towards the liver.

Other: None.
IMPRESSION: Gallstones and gallbladder wall thickening. Cholecystitis scratch
cholecystitis cannot be excluded. If further imaging is clinically
indicated consider nuclear medicine hepatic biliary scan to assess
the patency of the cystic duct.

## 2023-01-12 IMAGING — CR DG CHEST 2V
3 series · 3 of 3 positions shown · non-contrast
Comparison: 12/15/2020

CLINICAL DATA: Dyspnea

EXAM:
CHEST - 2 VIEW

[chest lat (1 of 2)]
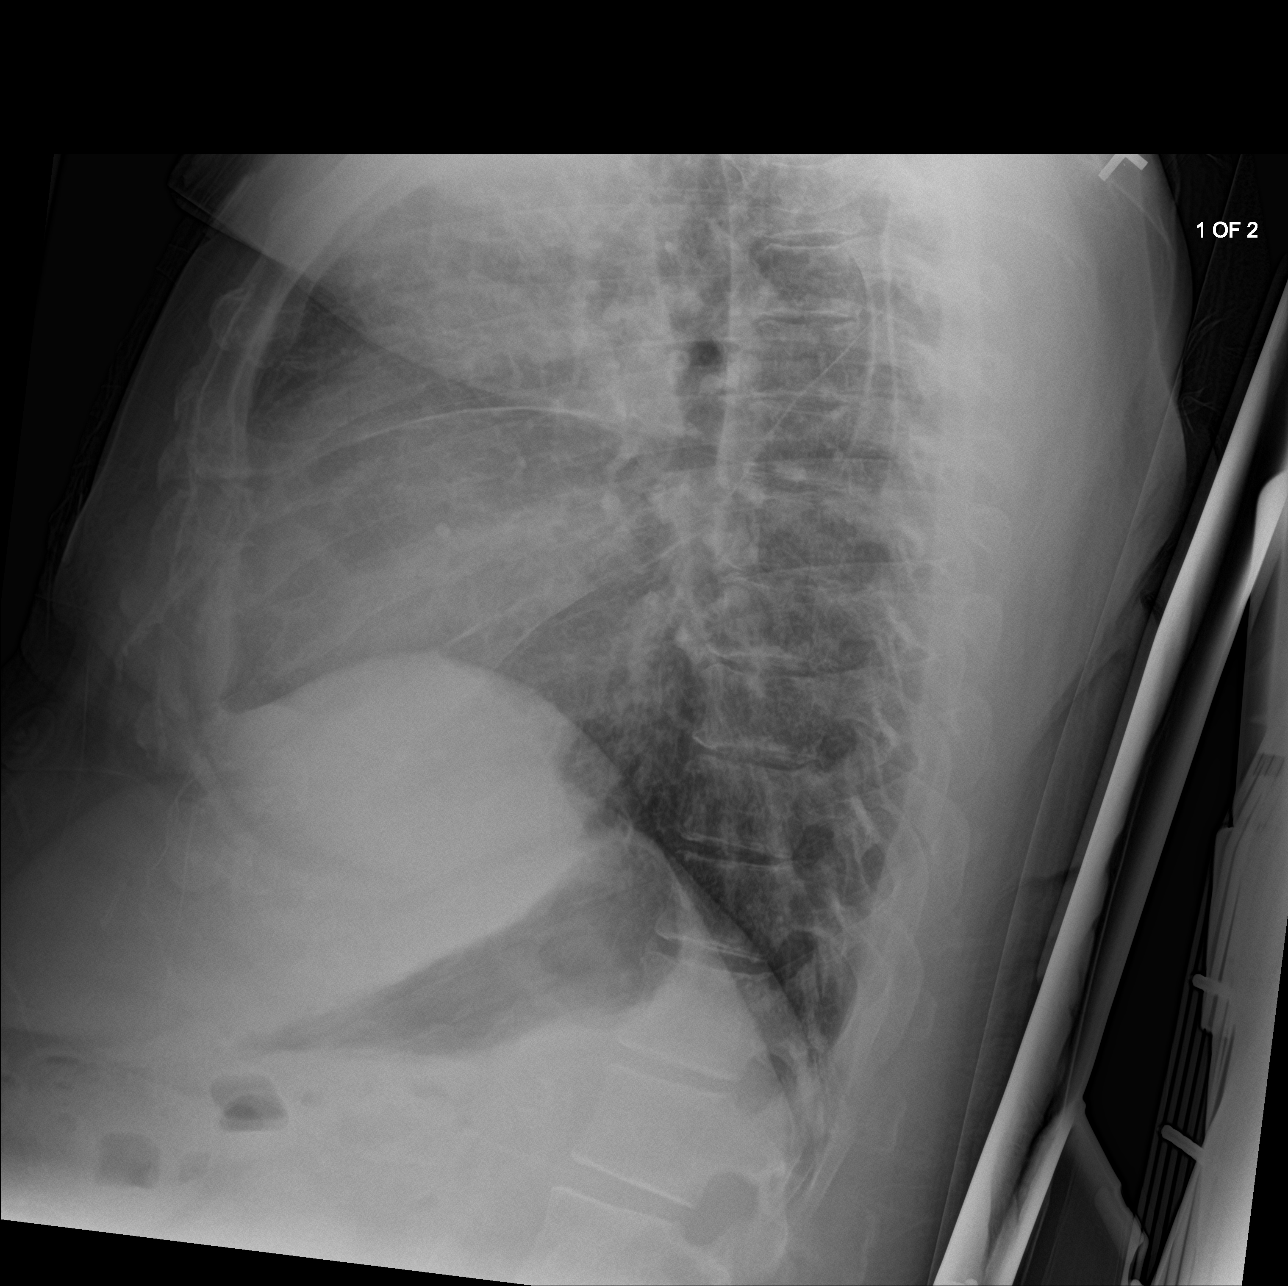

[chest ap]
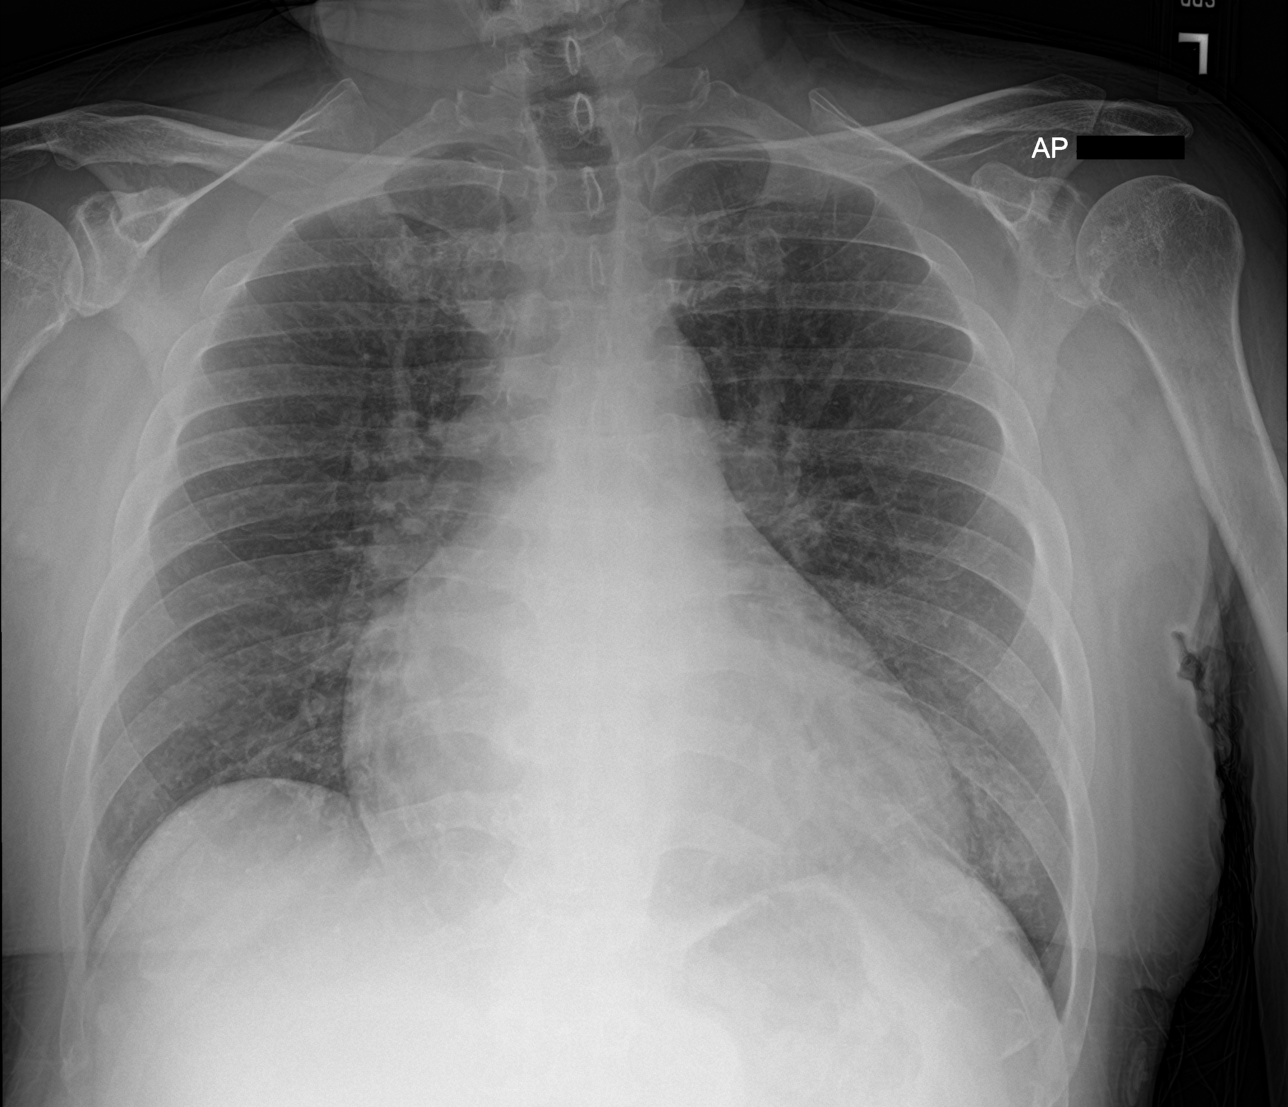

[chest lat (2 of 2)]
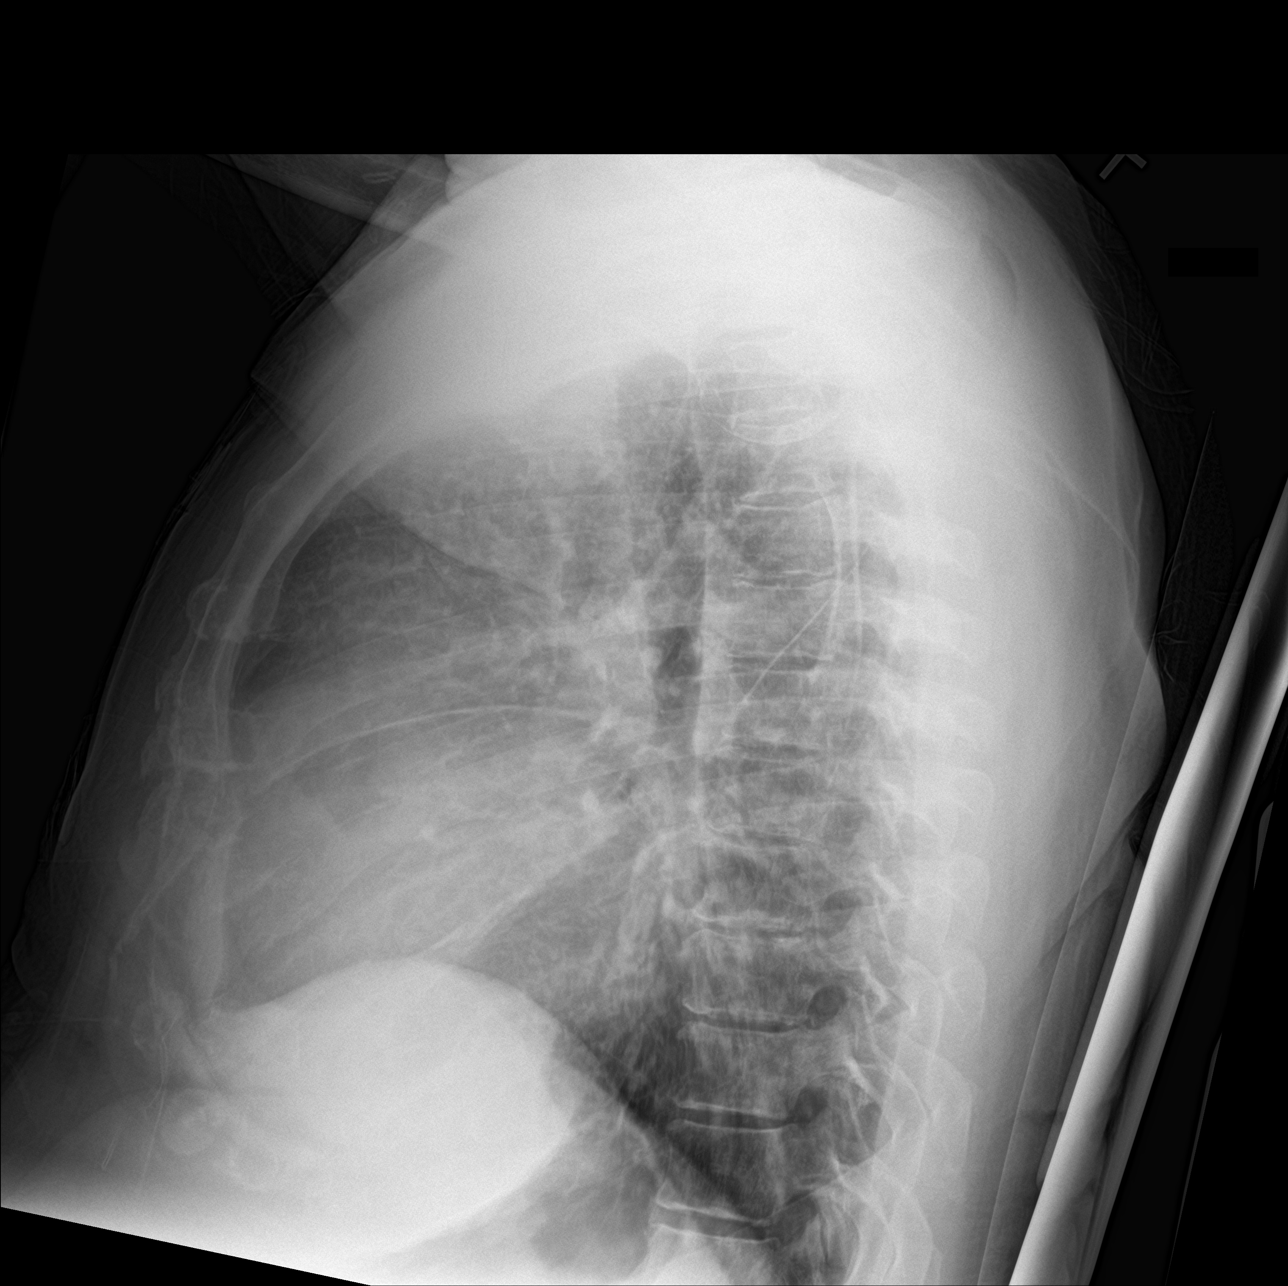

[3 of 3 positions shown; findings below may reference images not displayed]

FINDINGS: The lungs are symmetrically well expanded. There is interval
development of mild streaky infiltrate within the retrocardiac left
lower lobe, new since prior examination, possibly infectious or
inflammatory. The lungs are otherwise clear. No pneumothorax or
pleural effusion. Cardiac size is mildly enlarged, unchanged.
Pulmonary vascularity is normal. No acute bone abnormality.
IMPRESSION: Sparse infiltrate within the left lower lobe, possibly infectious in
the acute setting.

## 2023-01-12 IMAGING — US US EXTREM LOW VENOUS
1 series · 13 of 24 positions shown · non-contrast
Comparison: None.

CLINICAL DATA: Rule out DVT.



[Series 1: us venous img lower bilat (dvt) · portal-venous · 13 of 83 slices shown]
[im 1/83]
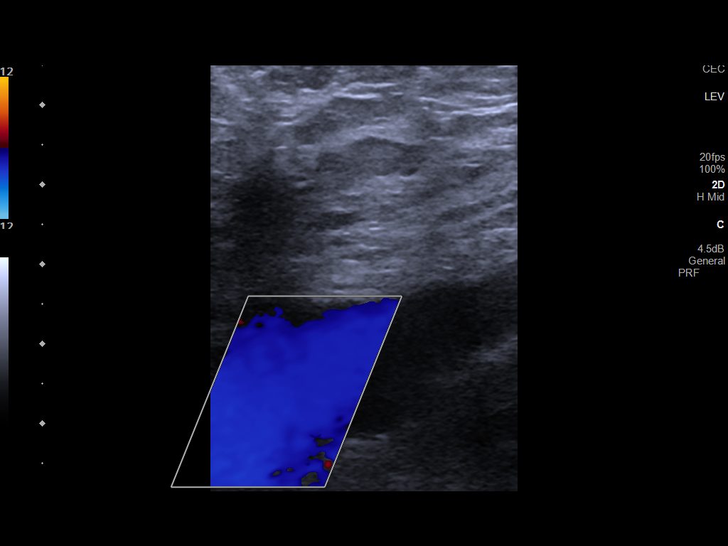
[im 8/83]
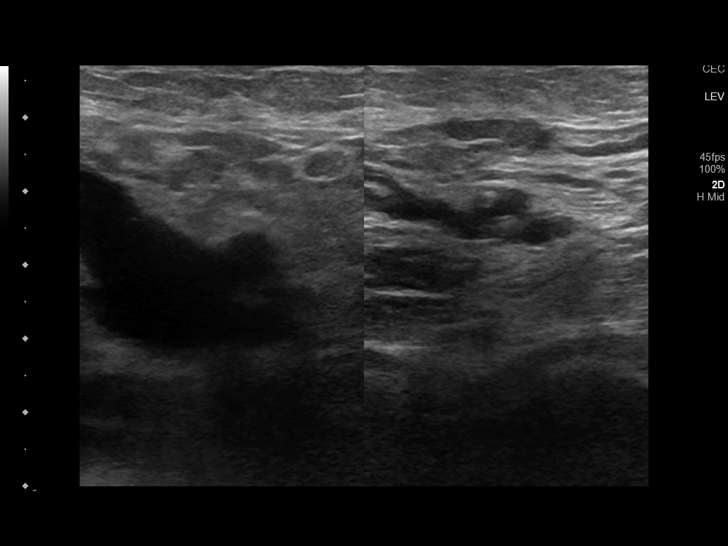
[im 15/83]
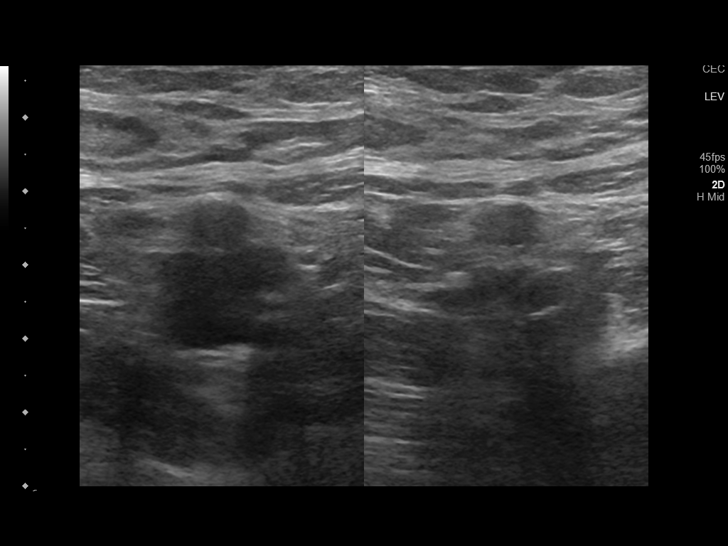
[im 22/83]
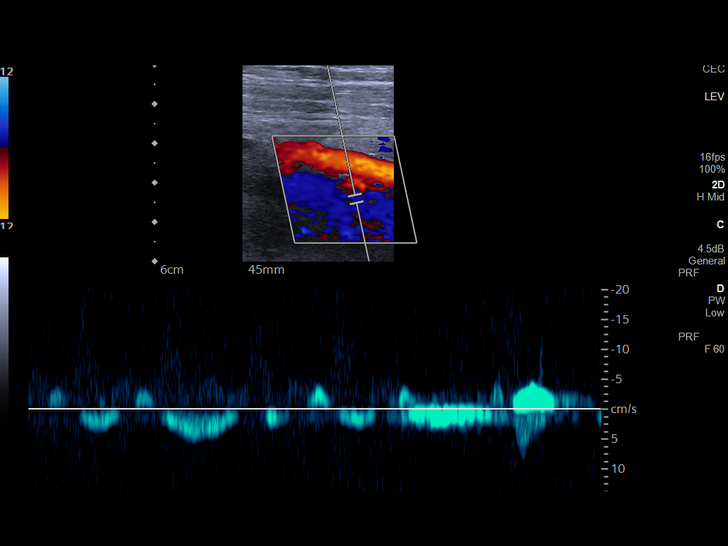
[im 29/83]
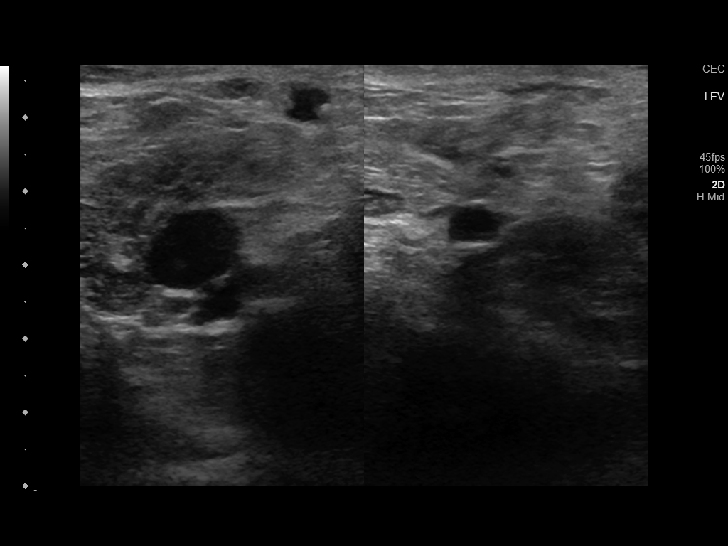
[im 36/83]
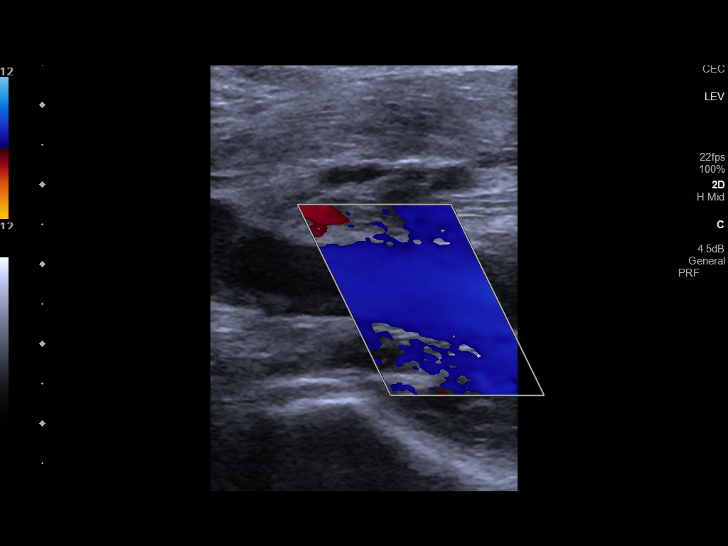
[im 43/83]
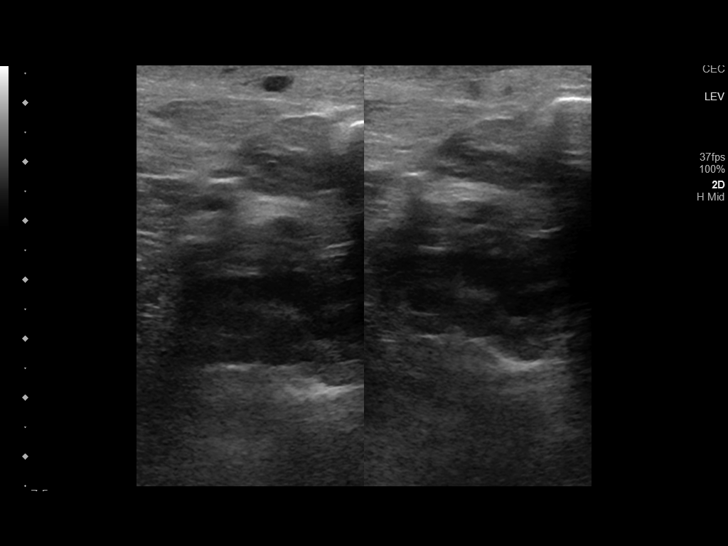
[im 47/83]
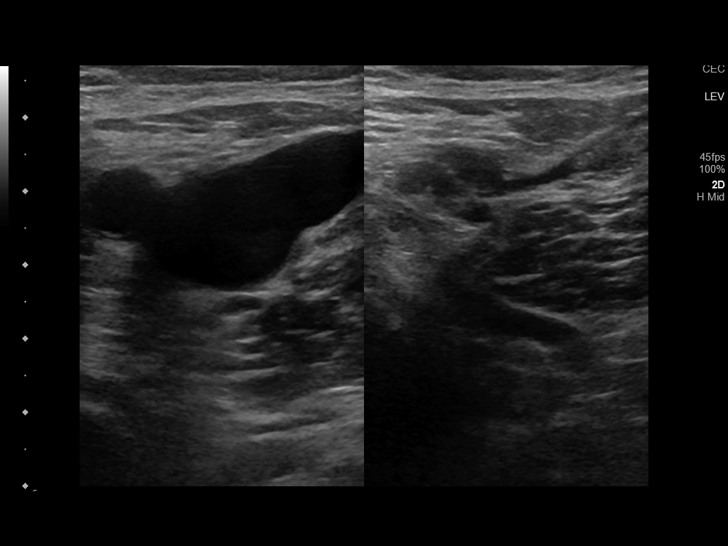
[im 54/83]
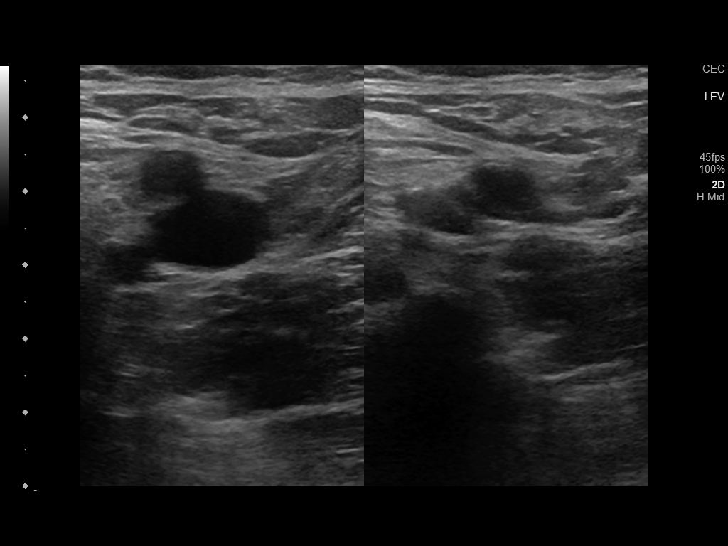
[im 61/83]
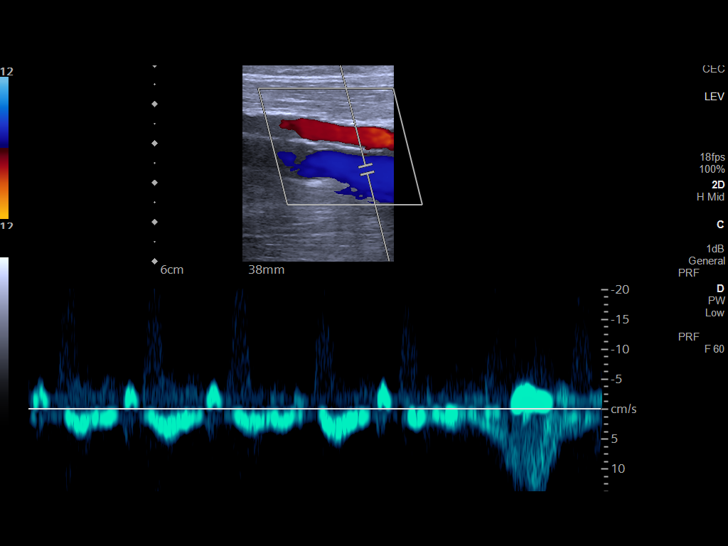
[im 68/83]
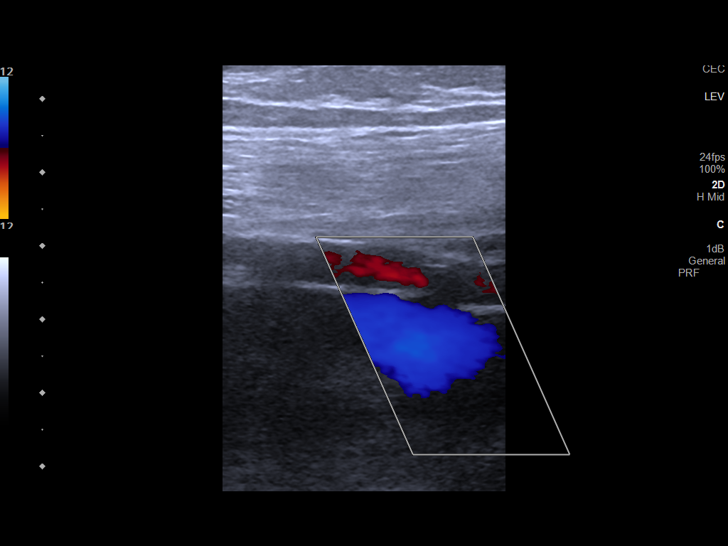
[im 75/83]
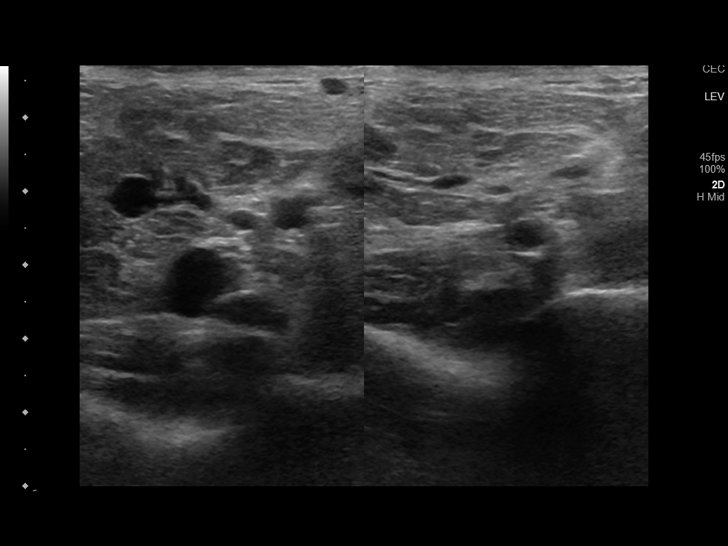
[im 83/83]
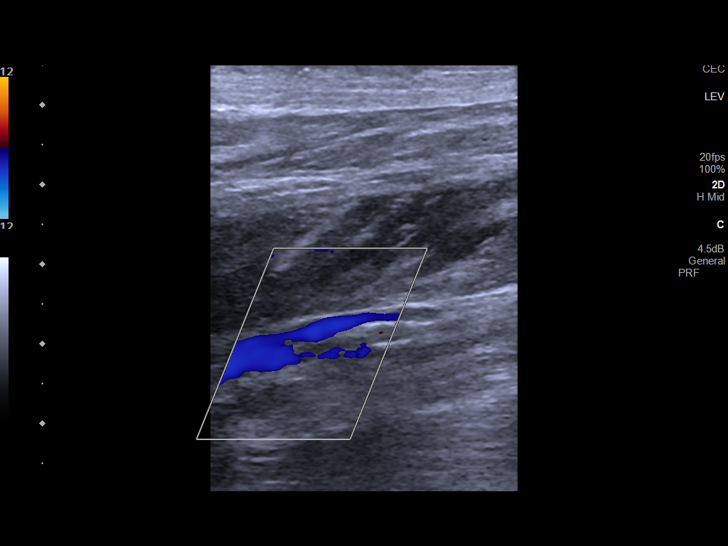

[13 of 24 positions shown; findings below may reference images not displayed]

FINDINGS: RIGHT LOWER EXTREMITY

Common Femoral Vein: No evidence of thrombus. Normal
compressibility, respiratory phasicity and response to augmentation.

Saphenofemoral Junction: No evidence of thrombus. Normal
compressibility and flow on color Doppler imaging.

Profunda Femoral Vein: No evidence of thrombus. Normal
compressibility and flow on color Doppler imaging.

Femoral Vein: No evidence of thrombus. Normal compressibility,
respiratory phasicity and response to augmentation.

Popliteal Vein: No evidence of thrombus. Normal compressibility,
respiratory phasicity and response to augmentation.

Calf Veins: No evidence of thrombus. Normal compressibility and flow
on color Doppler imaging.

Superficial Great Saphenous Vein: No evidence of thrombus. Normal
compressibility.

Venous Reflux:  None.

Other Findings:  None.

LEFT LOWER EXTREMITY

Common Femoral Vein: No evidence of thrombus. Normal
compressibility, respiratory phasicity and response to augmentation.

Saphenofemoral Junction: No evidence of thrombus. Normal
compressibility and flow on color Doppler imaging.

Profunda Femoral Vein: No evidence of thrombus. Normal
compressibility and flow on color Doppler imaging.

Femoral Vein: No evidence of thrombus. Normal compressibility,
respiratory phasicity and response to augmentation.

Popliteal Vein: No evidence of thrombus. Normal compressibility,
respiratory phasicity and response to augmentation.

Calf Veins: No evidence of thrombus. Normal compressibility and flow
on color Doppler imaging.

Superficial Great Saphenous Vein: No evidence of thrombus. Normal
compressibility.

Venous Reflux:  None.

Other Findings:  None.
IMPRESSION: No evidence of deep venous thrombosis in either lower extremity.

## 2023-09-05 NOTE — Progress Notes (Signed)
 Vascular access clinic  Pt is on HD in [place] x 1.5 year via [facility]  [facility] Has LVAD and is not pursuing heart or kidney transplant. Right hand dominant  Past Medical History[1] Past Surgical History[2] Current Outpatient Medications  Medication Instructions  . calcitriol (ROCALTROL) 0.25 mcg, Oral, Daily (standard)  . cholecalciferol (vitamin D3-125 mcg (5,000 unit)) 125 mcg, Oral, Daily (standard)  . mexiletine (MEXITIL) 150 MG capsule TAKE 1 CAPSULE BY MOUTH EVERY 8 HOURS  . sevelamer (RENVELA) 800 mg, Oral, 3 times a day (with meals)  . silver sulfADIAZINE (SILVADENE, SSD) 1 % cream Topical, Daily (standard)  . warfarin (JANTOVEN) 5 MG tablet Using 1mg  and 5mg  tablets, take  5mg  all other days for a goal INR of 2-3.   Pulse 93   Temp 36.1 C (97 F) (Tympanic)   Ht 180.3 cm (5' 10.98)   Wt 90.3 kg (199 lb 1.6 oz)   SpO2 100%   BMI 27.78 kg/m  CTAB Abdomen soft Distal pulses intact  Plan Vein mapping today Planning left arm AVF v AVG pending the imaging  Risks and benefits of procedure discussed in clinic and we will call with potential OR dates      [1] Past Medical History: Diagnosis Date  . Acute pulmonary embolism     01/27/2021  . Chronic kidney disease   . Dilated cardiomyopathy     05/03/2018  . Gout of multiple sites 10/21/2021  . HFrEF (heart failure with reduced ejection fraction)       . Hyperlipidemia   . Hypertension   . Type 2 diabetes mellitus       [2] Past Surgical History: Procedure Laterality Date  . CHG X-RAY GUIDE, GI DILATION N/A 05/26/2022   Procedure: INTRALUMINAL DILATION OF  STRICT/OBSTRUCT, RAD S & I;  Surgeon: Minnie Krystal Claude, MD;  Location: GI PROCEDURES [facility];  Service: Gastroenterology  . PR ECMO/ECLS RMVL OF PRPH CANNULA PRQ 6 YRS & OLDER N/A 04/23/2022   Procedure: ECMO / ECLS PROVIDED BY PHYSICIAN; REMOVAL OF PERIPHERAL (ARTERIAL AND/OR VENOUS) CANNULA(E), PERCUTANEOUS, 6 YEARS AND OLDER;  Surgeon: Noreene Horris Miguel, MD;  Location: MAIN OR [facility];  Service: Cardiac Surgery  . PR EDG ENDOSCOPIC STENT PLACEMENT W/WIRE& DILATION N/A 05/26/2022   Procedure: EGD, FLEXIBLE, TRANSORAL; WITH PLACEMENT OF ENDOSCOPIC STENT (INCLUDES PRE-AND POST-DILATION AND GUIDE WIRE PASSAGE, WHEN PERFORMED);  Surgeon: Minnie Krystal Claude, MD;  Location: GI PROCEDURES [facility];  Service: Gastroenterology  . PR EGD FLEXIBLE FOREIGN BODY REMOVAL N/A 08/01/2023   Procedure: UGI ENDOSCOPY; W/REMOVAL FOREIGN BODY;  Surgeon: Celestina Comer Norris, MD;  Location: GI PROC[facility]URES [facility];  Service: Gastroenterology  . PR ENDOSCOPIC ULTRASOUND EXAM N/A 05/26/2022   Procedure: UGI ENDO; W/ENDO ULTRASOUND EXAM INCLUDES ESOPHAGUS, STOMACH, &/OR DUODENUM/JEJUNUM;  Surgeon: Minnie Krystal Claude, MD;  Location: GI PROCEDURES [facility];  Service: Gastroenterology  . PR ENDOSCOPIC US  EXAM, ESOPH N/A 05/26/2022   Procedure: UGI ENDOSCOPY; WITH ENDOSCOPIC ULTRASOUND EXAMINATION LIMITED TO THE ESOPHAGUS;  Surgeon: Minnie Krystal Claude, MD;  Location: GI PROC[facility]URES [facility];  Service: Gastroenterology  . PR EXPLOR POSTOP BLEED,INFEC,CLOT-CHST Midline 04/23/2022   Procedure: EXPLOR POSTOP HEMORR THROMBOSIS/INFEC; CHEST;  Surgeon: Noreene Horris Miguel, MD;  Location: MAIN OR [facility];  Service: Cardiac Surgery  . PR INSERT INTRA-AORTIC BALLOON ASST DEVICE N/A 04/05/2022   Procedure: Insert IABP;  Surgeon: Gil Franky Barter, MD;  Location: [facility] CATH;  Service: Cardiology  . PR INSERT NON-TUNNEL CV CATH Midline 04/09/2022   Procedure: INSERTION  OF NON-TUNNELED CENTRALLY INSERTED CENTRAL VENOUS CATHETER; AGE 64 YEARS OR OLDER;  Surgeon: Noreene Horris Miguel, MD;  Location: MAIN OR [facility];  Service: Cardiac Surgery  . PR INSERT VENT ASST DEV,IMPLANT,SINGLE VENT Midline 04/09/2022   Procedure: INSERTION OF VENTRICULAR ASSIST DEVICE, IMPLANTABLE INTRACORPOREAL, SINGLE VENTRICLE;  Surgeon: Noreene Horris Miguel, MD;  Location: MAIN OR  [facility];  Service: Cardiac Surgery  . PR INSERT VENT ASST DEVICE,SINGLE VENTRICLE Midline 04/09/2022   Procedure: INSERTION VENTRICULAR ASSIST DEVICE; EXTRACORPOREAL, SINGLE VENTRICLE;  Surgeon: Noreene Horris Miguel, MD;  Location: MAIN OR [facility];  Service: Cardiac Surgery  . PR INSERT VENT ASST DEVICE,SINGLE VENTRICLE Midline 04/23/2022   Procedure: INSERTION VENTRICULAR ASSIST DEVICE; EXTRACORPOREAL, SINGLE VENTRICLE;  Surgeon: Noreene Horris Miguel, MD;  Location: MAIN OR [facility];  Service: Cardiac Surgery  . PR INSJ PERQ VAD W/RS&I L HRT ARTERIAL ACCESS ONLY Right 04/07/2022   Procedure: INSERTION OF VENTRICULAR ASSIST DEVICE, PERCUTANEOUS, INCLUDING RADIOLOGICAL SUPERVISION AND INTERPRETATION; LEFT HEART, ARTERIAL ACCESS ONLY;  Surgeon: Noreene Horris Miguel, MD;  Location: MAIN OR [facility];  Service: Cardiac Surgery  . PR PLACE PERCUT GASTROSTOMY TUBE N/A 05/26/2022   Procedure: UGI ENDO; W/DIRECTED PLCMT PERQ GASTROSTOMY TUBE;  Surgeon: Minnie Krystal Claude, MD;  Location: GI PROCEDURES [facility];  Service: Gastroenterology  . PR PLACEMENT NG/OG TUBE BY PHYSICIAN N/A 05/26/2022   Procedure: NASO- OR ORO-GASTRIC TUBE PLACEMENT, REQUIRING PHYSICIAN SKILL AND FLUOROSCOPIC GUIDANCE;  Surgeon: Minnie Krystal Claude, MD;  Location: GI PROCEDURES [facility];  Service: Gastroenterology  . PR REMOVAL PERQ LEFT HRT VAD ARTL/ARTL&VEN SEP INSJ N/A 04/07/2022   Procedure: REMOVAL OF PERCUTANEOUS LEFT HEART VENTRICULAR ASSIST DEVICE, ARTERIAL OR ARTERIAL AND VENOUS CANNULA(S), AT SEPERATE AND DISTINCT SESSION FROM INSERTION;  Surgeon: Noreene Horris Miguel, MD;  Location: MAIN OR [facility];  Service: Cardiac Surgery  . PR REMOVE VENT ASST DEVICE,SINGLE VENTRICLE Midline 04/14/2022   Procedure: REMOVAL VENTRICULAR ASSIST DEVICE; EXTRACORPOREAL, SINGLE VENTRICLE;  Surgeon: Noreene Horris Miguel, MD;  Location: MAIN OR [facility];  Service: Cardiac Surgery  . PR REMOVE VENT ASST DEVICE,SINGLE VENTRICLE Midline  05/10/2022   Procedure: REMOVAL VENTRICULAR ASSIST DEVICE; EXTRACORPOREAL, SINGLE VENTRICLE;  Surgeon: Noreene Horris Miguel, MD;  Location: MAIN OR [facility];  Service: Cardiac Surgery  . PR RIGHT HEART CATH O2 SATURATION & CARDIAC OUTPUT N/A 09/30/2021   Procedure: Right Heart Catheterization;  Surgeon: Avelina Freddy Chihuahua, MD;  Location: [facility] CATH;  Service: Cardiology  . PR RIGHT HEART CATH O2 SATURATION & CARDIAC OUTPUT N/A 10/07/2021   Procedure: Right Heart Catheterization;  Surgeon: Drusilla Elsie Lupita DOUGLAS, MD;  Location: [facility] CATH;  Service: Cardiology  . PR RIGHT HEART CATH O2 SATURATION & CARDIAC OUTPUT N/A 04/22/2022   Procedure: Right Heart Catheterization With Intervention;  Surgeon: Barnet Donnice Righter, MD;  Location: [facility] CATH;  Service: Cardiology  . PR RMV AORTIC BALLOON DEVICE Right 04/09/2022   Procedure: REM INTRA-AORTIC BALLOON ASSIST DEVICE PERCUTANEOUS;  Surgeon: Noreene Horris Miguel, MD;  Location: MAIN OR [facility]  Service: Cardiac Surgery  . PR UPPER GI ENDOSCOPY,CTRL BLEED N/A 08/01/2023   Procedure: UGI ENDOSCOPY; WITH CONTROL OF BLEEDING, ANY METHOD;  Surgeon: Celestina Comer Norris, MD;  Location: GI [facility]URES [facility];  Service: Gastroenterology  . PR UPPER GI ENDOSCOPY,DRAIN PSEUDOCYST N/A 05/26/2022   Procedure: UGI; W/TRANSMURAL DRAIN PSEUDOCYST;  Surgeon: Minnie Krystal Claude, MD;  Location: GI PROCEDURES [facility];  Service: Gastroenterology

## 2023-09-13 NOTE — Unmapped External Note (Addendum)
 Patient chart reviewed by [facility] Pre-Procedure Services at [facility].  Per Adult Anesthesia Algorithm, patient meets criteria No visit needed as evidenced by well known medical history to Anesthesia- Chart reviewed with Dr. Loreda. No testing noted at the time of review.     Estimated body mass index is 27.78 kg/m as calculated from the following:   Height as of 09/05/23: 180.3 cm (5' 10.98).   Weight as of 09/05/23: 90.3 kg (199 lb 1.6 oz).  Procedure date: 9/30  Posted Procedure Location:  [facility]  Provider for procedure: Aundria, Marsa Messier, MD   Appointment information: patient will be contacted to schedule

## 2023-11-13 ENCOUNTER — Telehealth: Payer: Self-pay

## 2023-11-13 NOTE — Telephone Encounter (Signed)
 Spoke to patient notified him we are unable to place referral until they see him.

## 2023-11-13 NOTE — Telephone Encounter (Signed)
 Copied from CRM #[id number]. Topic: Referral - Question >> Nov 13, 2023  8:29 AM Laymon HERO wrote: Reason for CRM: Referral to [facility] - [facility] appointment 12/03 with Carrol Aurora

## 2023-11-13 NOTE — Telephone Encounter (Signed)
 He has not been seen in almost 2 years. He will need an office visit with any provider if he is having an issue. It would not be his TOC. Please help him get scheduled. Thank you.

## 2023-12-20 ENCOUNTER — Encounter: Admitting: General Practice
